# Patient Record
Sex: Female | Born: 1935 | Race: White | Hispanic: No | Smoking: Former smoker
Health system: Southern US, Community
[De-identification: ages and names within clinical notes are randomized; demographics above are authoritative.]

## PROBLEM LIST (undated history)

## (undated) DIAGNOSIS — G609 Hereditary and idiopathic neuropathy, unspecified: Secondary | ICD-10-CM

## (undated) DIAGNOSIS — J449 Chronic obstructive pulmonary disease, unspecified: Secondary | ICD-10-CM

## (undated) DIAGNOSIS — I4891 Unspecified atrial fibrillation: Secondary | ICD-10-CM

## (undated) DIAGNOSIS — F329 Major depressive disorder, single episode, unspecified: Secondary | ICD-10-CM

## (undated) DIAGNOSIS — E785 Hyperlipidemia, unspecified: Secondary | ICD-10-CM

## (undated) DIAGNOSIS — M353 Polymyalgia rheumatica: Secondary | ICD-10-CM

## (undated) DIAGNOSIS — R55 Syncope and collapse: Secondary | ICD-10-CM

## (undated) DIAGNOSIS — F32A Depression, unspecified: Secondary | ICD-10-CM

## (undated) DIAGNOSIS — I10 Essential (primary) hypertension: Secondary | ICD-10-CM

## (undated) DIAGNOSIS — I509 Heart failure, unspecified: Secondary | ICD-10-CM

## (undated) DIAGNOSIS — K219 Gastro-esophageal reflux disease without esophagitis: Secondary | ICD-10-CM

## (undated) DIAGNOSIS — S32599A Other specified fracture of unspecified pubis, initial encounter for closed fracture: Secondary | ICD-10-CM

## (undated) DIAGNOSIS — E039 Hypothyroidism, unspecified: Secondary | ICD-10-CM

## (undated) HISTORY — DX: Hereditary and idiopathic neuropathy, unspecified: G60.9

## (undated) HISTORY — DX: Other specified fracture of unspecified pubis, initial encounter for closed fracture: S32.599A

## (undated) HISTORY — DX: Hypothyroidism, unspecified: E03.9

## (undated) HISTORY — DX: Hyperlipidemia, unspecified: E78.5

## (undated) HISTORY — DX: Depression, unspecified: F32.A

## (undated) HISTORY — PX: PARTIAL HYSTERECTOMY: SHX80

## (undated) HISTORY — DX: Chronic obstructive pulmonary disease, unspecified: J44.9

## (undated) HISTORY — PX: PLEURAL SCARIFICATION: SHX748

## (undated) HISTORY — DX: Essential (primary) hypertension: I10

## (undated) HISTORY — DX: Polymyalgia rheumatica: M35.3

## (undated) HISTORY — DX: Unspecified atrial fibrillation: I48.91

## (undated) HISTORY — DX: Syncope and collapse: R55

## (undated) HISTORY — DX: Major depressive disorder, single episode, unspecified: F32.9

## (undated) HISTORY — DX: Gastro-esophageal reflux disease without esophagitis: K21.9

## (undated) HISTORY — DX: Heart failure, unspecified: I50.9

---
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
## 1997-12-30 ENCOUNTER — Ambulatory Visit (HOSPITAL_COMMUNITY): Admission: RE | Admit: 1997-12-30 | Discharge: 1997-12-30 | Payer: Self-pay | Admitting: Pulmonary Disease

## 1999-05-27 ENCOUNTER — Encounter: Payer: Self-pay | Admitting: Family Medicine

## 1999-05-27 LAB — CONVERTED CEMR LAB: Hgb A1c MFr Bld: 6.7 %

## 1999-09-14 ENCOUNTER — Other Ambulatory Visit: Admission: RE | Admit: 1999-09-14 | Discharge: 1999-09-14 | Payer: Self-pay | Admitting: Family Medicine

## 1999-10-25 ENCOUNTER — Encounter: Payer: Self-pay | Admitting: Family Medicine

## 1999-10-25 LAB — CONVERTED CEMR LAB
Hgb A1c MFr Bld: 6 %
Microalbumin U total vol: 300 mg/L

## 1999-11-22 ENCOUNTER — Encounter: Payer: Self-pay | Admitting: Family Medicine

## 1999-11-22 ENCOUNTER — Encounter: Admission: RE | Admit: 1999-11-22 | Discharge: 1999-11-22 | Payer: Self-pay | Admitting: Family Medicine

## 2000-02-24 ENCOUNTER — Encounter: Payer: Self-pay | Admitting: Family Medicine

## 2000-02-24 LAB — CONVERTED CEMR LAB
Hgb A1c MFr Bld: 6.2 %
Microalbumin U total vol: 73.5 mg/L

## 2000-06-26 ENCOUNTER — Encounter: Payer: Self-pay | Admitting: Family Medicine

## 2000-06-26 LAB — CONVERTED CEMR LAB: Hgb A1c MFr Bld: 5.6 %

## 2001-04-26 ENCOUNTER — Encounter: Payer: Self-pay | Admitting: Family Medicine

## 2001-04-26 LAB — CONVERTED CEMR LAB: Pap Smear: NORMAL

## 2001-05-07 ENCOUNTER — Other Ambulatory Visit: Admission: RE | Admit: 2001-05-07 | Discharge: 2001-05-07 | Payer: Self-pay | Admitting: Family Medicine

## 2001-05-26 ENCOUNTER — Encounter: Admission: RE | Admit: 2001-05-26 | Discharge: 2001-05-26 | Payer: Self-pay | Admitting: Family Medicine

## 2001-05-26 ENCOUNTER — Ambulatory Visit: Admission: RE | Admit: 2001-05-26 | Discharge: 2001-05-26 | Payer: Self-pay | Admitting: Pulmonary Disease

## 2001-05-26 ENCOUNTER — Encounter: Payer: Self-pay | Admitting: Family Medicine

## 2001-09-26 ENCOUNTER — Encounter: Payer: Self-pay | Admitting: Family Medicine

## 2001-09-26 LAB — CONVERTED CEMR LAB
Hgb A1c MFr Bld: 5.4 %
Microalbumin U total vol: 40.8 mg/L

## 2002-06-26 ENCOUNTER — Encounter: Payer: Self-pay | Admitting: Family Medicine

## 2002-06-26 LAB — CONVERTED CEMR LAB
Hgb A1c MFr Bld: 6.4 %
Microalbumin U total vol: 58 mg/L

## 2002-09-26 ENCOUNTER — Encounter: Payer: Self-pay | Admitting: Family Medicine

## 2002-09-26 LAB — CONVERTED CEMR LAB: Hgb A1c MFr Bld: 5.9 %

## 2003-05-05 ENCOUNTER — Encounter: Payer: Self-pay | Admitting: Emergency Medicine

## 2003-05-05 ENCOUNTER — Inpatient Hospital Stay (HOSPITAL_COMMUNITY): Admission: EM | Admit: 2003-05-05 | Discharge: 2003-05-06 | Payer: Self-pay | Admitting: Emergency Medicine

## 2003-05-06 ENCOUNTER — Encounter: Payer: Self-pay | Admitting: Cardiology

## 2003-06-27 ENCOUNTER — Encounter: Payer: Self-pay | Admitting: Family Medicine

## 2003-06-27 LAB — CONVERTED CEMR LAB
Hgb A1c MFr Bld: 5.5 %
Hgb A1c MFr Bld: 5.5 %
Microalbumin U total vol: 88.7 mg/L

## 2003-08-27 ENCOUNTER — Encounter: Payer: Self-pay | Admitting: Family Medicine

## 2003-08-27 LAB — CONVERTED CEMR LAB
Hgb A1c MFr Bld: 6.4 %
Hgb A1c MFr Bld: 6.4 %

## 2003-08-29 ENCOUNTER — Inpatient Hospital Stay (HOSPITAL_COMMUNITY): Admission: EM | Admit: 2003-08-29 | Discharge: 2003-09-02 | Payer: Self-pay | Admitting: Physical Therapy

## 2004-04-26 ENCOUNTER — Encounter: Payer: Self-pay | Admitting: Family Medicine

## 2004-04-26 LAB — CONVERTED CEMR LAB
Hgb A1c MFr Bld: 6.2 %
Hgb A1c MFr Bld: 6.2 %
Microalbumin U total vol: 7.6 mg/L
Microalbumin U total vol: 7.6 mg/L

## 2004-07-10 ENCOUNTER — Ambulatory Visit: Payer: Self-pay | Admitting: Family Medicine

## 2004-07-26 ENCOUNTER — Encounter: Payer: Self-pay | Admitting: Family Medicine

## 2004-07-26 LAB — CONVERTED CEMR LAB
Hgb A1c MFr Bld: 6.3 %
Hgb A1c MFr Bld: 6.3 %

## 2004-08-14 ENCOUNTER — Ambulatory Visit: Payer: Self-pay | Admitting: Family Medicine

## 2004-08-17 ENCOUNTER — Ambulatory Visit: Payer: Self-pay | Admitting: Family Medicine

## 2004-09-10 ENCOUNTER — Ambulatory Visit: Payer: Self-pay | Admitting: Family Medicine

## 2004-09-24 ENCOUNTER — Ambulatory Visit: Payer: Self-pay | Admitting: Family Medicine

## 2004-10-22 ENCOUNTER — Ambulatory Visit: Payer: Self-pay | Admitting: Family Medicine

## 2004-11-19 ENCOUNTER — Ambulatory Visit: Payer: Self-pay | Admitting: Family Medicine

## 2004-12-17 ENCOUNTER — Ambulatory Visit: Payer: Self-pay | Admitting: Family Medicine

## 2005-01-14 ENCOUNTER — Ambulatory Visit: Payer: Self-pay | Admitting: Family Medicine

## 2005-02-11 ENCOUNTER — Ambulatory Visit: Payer: Self-pay | Admitting: Family Medicine

## 2005-02-14 ENCOUNTER — Ambulatory Visit: Payer: Self-pay | Admitting: Family Medicine

## 2005-03-18 ENCOUNTER — Ambulatory Visit: Payer: Self-pay | Admitting: Family Medicine

## 2005-04-15 ENCOUNTER — Ambulatory Visit: Payer: Self-pay | Admitting: Family Medicine

## 2005-05-14 ENCOUNTER — Ambulatory Visit: Payer: Self-pay | Admitting: Family Medicine

## 2005-05-26 ENCOUNTER — Encounter: Payer: Self-pay | Admitting: Family Medicine

## 2005-05-26 LAB — CONVERTED CEMR LAB
Hgb A1c MFr Bld: 5.7 %
Hgb A1c MFr Bld: 5.7 %

## 2005-06-11 ENCOUNTER — Ambulatory Visit: Payer: Self-pay | Admitting: Family Medicine

## 2005-06-26 ENCOUNTER — Ambulatory Visit: Payer: Self-pay | Admitting: Family Medicine

## 2005-07-10 ENCOUNTER — Ambulatory Visit: Payer: Self-pay | Admitting: Family Medicine

## 2005-07-26 ENCOUNTER — Encounter: Payer: Self-pay | Admitting: Family Medicine

## 2005-07-26 LAB — CONVERTED CEMR LAB
Microalbumin U total vol: 7.9 mg/L
Microalbumin U total vol: 7.9 mg/L

## 2005-08-08 ENCOUNTER — Ambulatory Visit: Payer: Self-pay | Admitting: Family Medicine

## 2005-09-04 ENCOUNTER — Ambulatory Visit: Payer: Self-pay | Admitting: Family Medicine

## 2005-09-09 ENCOUNTER — Ambulatory Visit: Payer: Self-pay | Admitting: Family Medicine

## 2005-09-19 ENCOUNTER — Ambulatory Visit: Payer: Self-pay | Admitting: Cardiology

## 2005-10-01 ENCOUNTER — Encounter: Payer: Self-pay | Admitting: Internal Medicine

## 2005-10-01 ENCOUNTER — Ambulatory Visit: Payer: Self-pay

## 2005-10-02 ENCOUNTER — Ambulatory Visit: Payer: Self-pay | Admitting: Family Medicine

## 2005-10-14 ENCOUNTER — Ambulatory Visit: Payer: Self-pay | Admitting: Cardiology

## 2005-10-28 ENCOUNTER — Ambulatory Visit: Payer: Self-pay | Admitting: Family Medicine

## 2005-10-30 ENCOUNTER — Ambulatory Visit: Payer: Self-pay | Admitting: Family Medicine

## 2005-11-27 ENCOUNTER — Ambulatory Visit: Payer: Self-pay | Admitting: Family Medicine

## 2005-12-11 ENCOUNTER — Ambulatory Visit: Payer: Self-pay | Admitting: Family Medicine

## 2005-12-25 ENCOUNTER — Ambulatory Visit: Payer: Self-pay | Admitting: Family Medicine

## 2006-01-08 ENCOUNTER — Ambulatory Visit: Payer: Self-pay | Admitting: Family Medicine

## 2006-01-22 ENCOUNTER — Ambulatory Visit: Payer: Self-pay | Admitting: Family Medicine

## 2006-02-06 ENCOUNTER — Ambulatory Visit: Payer: Self-pay | Admitting: Family Medicine

## 2006-02-19 ENCOUNTER — Ambulatory Visit: Payer: Self-pay | Admitting: Family Medicine

## 2006-03-05 ENCOUNTER — Ambulatory Visit: Payer: Self-pay | Admitting: Family Medicine

## 2006-04-02 ENCOUNTER — Ambulatory Visit: Payer: Self-pay | Admitting: Family Medicine

## 2006-04-30 ENCOUNTER — Ambulatory Visit: Payer: Self-pay | Admitting: Family Medicine

## 2006-05-27 ENCOUNTER — Ambulatory Visit: Payer: Self-pay | Admitting: Family Medicine

## 2006-06-25 ENCOUNTER — Ambulatory Visit: Payer: Self-pay | Admitting: Family Medicine

## 2006-07-23 ENCOUNTER — Ambulatory Visit: Payer: Self-pay | Admitting: Family Medicine

## 2006-07-26 ENCOUNTER — Encounter: Payer: Self-pay | Admitting: Family Medicine

## 2006-07-26 LAB — CONVERTED CEMR LAB
Hgb A1c MFr Bld: 5.9 %
Microalbumin U total vol: 9.5 mg/L

## 2006-08-06 ENCOUNTER — Ambulatory Visit: Payer: Self-pay | Admitting: Family Medicine

## 2006-08-11 ENCOUNTER — Ambulatory Visit: Payer: Self-pay | Admitting: Family Medicine

## 2006-09-03 ENCOUNTER — Ambulatory Visit: Payer: Self-pay | Admitting: Family Medicine

## 2006-10-01 ENCOUNTER — Ambulatory Visit: Payer: Self-pay | Admitting: Family Medicine

## 2006-10-06 ENCOUNTER — Ambulatory Visit: Payer: Self-pay | Admitting: Family Medicine

## 2006-10-13 ENCOUNTER — Ambulatory Visit: Payer: Self-pay | Admitting: Family Medicine

## 2006-10-13 LAB — CONVERTED CEMR LAB
BUN: 23 mg/dL (ref 6–23)
Basophils Absolute: 0 10*3/uL (ref 0.0–0.1)
Basophils Relative: 0.2 % (ref 0.0–1.0)
CO2: 30 meq/L (ref 19–32)
Calcium: 9.1 mg/dL (ref 8.4–10.5)
Chloride: 103 meq/L (ref 96–112)
Creatinine, Ser: 1.2 mg/dL (ref 0.4–1.2)
Eosinophils Absolute: 0.2 10*3/uL (ref 0.0–0.6)
Eosinophils Relative: 2 % (ref 0.0–5.0)
GFR calc Af Amer: 56 mL/min
GFR calc non Af Amer: 46 mL/min
Glucose, Bld: 112 mg/dL — ABNORMAL HIGH (ref 70–99)
HCT: 42.4 % (ref 36.0–46.0)
Hemoglobin: 14.2 g/dL (ref 12.0–15.0)
Lymphocytes Relative: 26.2 % (ref 12.0–46.0)
MCHC: 33.5 g/dL (ref 30.0–36.0)
MCV: 89.1 fL (ref 78.0–100.0)
Monocytes Absolute: 0.8 10*3/uL — ABNORMAL HIGH (ref 0.2–0.7)
Monocytes Relative: 9.3 % (ref 3.0–11.0)
Neutro Abs: 5.2 10*3/uL (ref 1.4–7.7)
Neutrophils Relative %: 62.3 % (ref 43.0–77.0)
Platelets: 214 10*3/uL (ref 150–400)
Potassium: 4.2 meq/L (ref 3.5–5.1)
Pro B Natriuretic peptide (BNP): 168 pg/mL — ABNORMAL HIGH (ref 0.0–100.0)
RBC: 4.76 M/uL (ref 3.87–5.11)
RDW: 12.9 % (ref 11.5–14.6)
Sodium: 140 meq/L (ref 135–145)
TSH: 1.62 microintl units/mL (ref 0.35–5.50)
WBC: 8.4 10*3/uL (ref 4.5–10.5)

## 2006-10-27 ENCOUNTER — Ambulatory Visit: Payer: Self-pay | Admitting: Family Medicine

## 2006-11-03 ENCOUNTER — Ambulatory Visit: Payer: Self-pay | Admitting: Family Medicine

## 2006-11-12 ENCOUNTER — Ambulatory Visit: Payer: Self-pay | Admitting: Family Medicine

## 2006-11-12 LAB — CONVERTED CEMR LAB
BUN: 19 mg/dL (ref 6–23)
CO2: 31 meq/L (ref 19–32)
Calcium: 9.2 mg/dL (ref 8.4–10.5)
Chloride: 104 meq/L (ref 96–112)
Creatinine, Ser: 1.2 mg/dL (ref 0.4–1.2)
GFR calc Af Amer: 56 mL/min
GFR calc non Af Amer: 46 mL/min
Glucose, Bld: 73 mg/dL (ref 70–99)
Potassium: 4.2 meq/L (ref 3.5–5.1)
Sodium: 141 meq/L (ref 135–145)

## 2006-11-17 ENCOUNTER — Ambulatory Visit: Payer: Self-pay | Admitting: Family Medicine

## 2006-12-01 ENCOUNTER — Ambulatory Visit: Payer: Self-pay | Admitting: Family Medicine

## 2006-12-25 ENCOUNTER — Telehealth: Payer: Self-pay | Admitting: Family Medicine

## 2006-12-29 ENCOUNTER — Ambulatory Visit: Payer: Self-pay | Admitting: Family Medicine

## 2006-12-29 LAB — CONVERTED CEMR LAB
INR: 2
Prothrombin Time: 17.6 s

## 2007-01-28 ENCOUNTER — Ambulatory Visit: Payer: Self-pay | Admitting: Family Medicine

## 2007-01-28 LAB — CONVERTED CEMR LAB
INR: 2.4
Prothrombin Time: 18.7 s

## 2007-02-02 ENCOUNTER — Telehealth (INDEPENDENT_AMBULATORY_CARE_PROVIDER_SITE_OTHER): Payer: Self-pay | Admitting: *Deleted

## 2007-02-06 ENCOUNTER — Encounter: Payer: Self-pay | Admitting: Family Medicine

## 2007-02-06 DIAGNOSIS — E119 Type 2 diabetes mellitus without complications: Secondary | ICD-10-CM | POA: Insufficient documentation

## 2007-02-06 DIAGNOSIS — N950 Postmenopausal bleeding: Secondary | ICD-10-CM

## 2007-02-06 DIAGNOSIS — I4891 Unspecified atrial fibrillation: Secondary | ICD-10-CM

## 2007-02-06 DIAGNOSIS — I509 Heart failure, unspecified: Secondary | ICD-10-CM | POA: Insufficient documentation

## 2007-02-07 DIAGNOSIS — E785 Hyperlipidemia, unspecified: Secondary | ICD-10-CM

## 2007-02-07 DIAGNOSIS — E039 Hypothyroidism, unspecified: Secondary | ICD-10-CM | POA: Insufficient documentation

## 2007-02-16 ENCOUNTER — Telehealth (INDEPENDENT_AMBULATORY_CARE_PROVIDER_SITE_OTHER): Payer: Self-pay | Admitting: Internal Medicine

## 2007-02-25 ENCOUNTER — Ambulatory Visit: Payer: Self-pay | Admitting: Family Medicine

## 2007-02-25 DIAGNOSIS — J309 Allergic rhinitis, unspecified: Secondary | ICD-10-CM | POA: Insufficient documentation

## 2007-02-25 LAB — CONVERTED CEMR LAB
INR: 2.5
Prothrombin Time: 19.2 s

## 2007-03-12 ENCOUNTER — Ambulatory Visit: Payer: Self-pay | Admitting: Family Medicine

## 2007-03-19 ENCOUNTER — Encounter (INDEPENDENT_AMBULATORY_CARE_PROVIDER_SITE_OTHER): Payer: Self-pay | Admitting: *Deleted

## 2007-03-26 ENCOUNTER — Ambulatory Visit: Payer: Self-pay | Admitting: Family Medicine

## 2007-03-26 LAB — CONVERTED CEMR LAB
INR: 2.5
Prothrombin Time: 19.2 s

## 2007-03-27 ENCOUNTER — Encounter: Payer: Self-pay | Admitting: Family Medicine

## 2007-03-27 ENCOUNTER — Emergency Department (HOSPITAL_COMMUNITY): Admission: EM | Admit: 2007-03-27 | Discharge: 2007-03-27 | Payer: Self-pay | Admitting: Emergency Medicine

## 2007-03-30 ENCOUNTER — Telehealth: Payer: Self-pay | Admitting: Family Medicine

## 2007-04-23 ENCOUNTER — Ambulatory Visit: Payer: Self-pay | Admitting: Family Medicine

## 2007-04-23 LAB — CONVERTED CEMR LAB
INR: 2.5
Prothrombin Time: 19 s

## 2007-05-21 ENCOUNTER — Ambulatory Visit: Payer: Self-pay | Admitting: Family Medicine

## 2007-05-21 LAB — CONVERTED CEMR LAB
INR: 2.7
Prothrombin Time: 19.9 s

## 2007-05-28 ENCOUNTER — Ambulatory Visit: Payer: Self-pay | Admitting: Family Medicine

## 2007-06-24 ENCOUNTER — Ambulatory Visit: Payer: Self-pay | Admitting: Family Medicine

## 2007-06-24 LAB — CONVERTED CEMR LAB
INR: 2.9
Prothrombin Time: 20.6 s

## 2007-07-21 ENCOUNTER — Ambulatory Visit: Payer: Self-pay | Admitting: Family Medicine

## 2007-07-21 LAB — CONVERTED CEMR LAB
INR: 2
Prothrombin Time: 17.5 s

## 2007-08-06 ENCOUNTER — Ambulatory Visit: Payer: Self-pay | Admitting: Family Medicine

## 2007-08-06 LAB — CONVERTED CEMR LAB
ALT: 12 units/L (ref 0–35)
AST: 19 units/L (ref 0–37)
BUN: 30 mg/dL — ABNORMAL HIGH (ref 6–23)
Basophils Absolute: 0 10*3/uL (ref 0.0–0.1)
Basophils Relative: 0 % (ref 0.0–1.0)
CO2: 28 meq/L (ref 19–32)
Calcium: 9.4 mg/dL (ref 8.4–10.5)
Chloride: 104 meq/L (ref 96–112)
Cholesterol: 257 mg/dL (ref 0–200)
Creatinine, Ser: 1 mg/dL (ref 0.4–1.2)
Creatinine,U: 121.2 mg/dL
Direct LDL: 145.2 mg/dL
Eosinophils Absolute: 0.3 10*3/uL (ref 0.0–0.6)
Eosinophils Relative: 3.9 % (ref 0.0–5.0)
Free T4: 1 ng/dL (ref 0.6–1.6)
GFR calc Af Amer: 69 mL/min
GFR calc non Af Amer: 57 mL/min
Glucose, Bld: 77 mg/dL (ref 70–99)
HCT: 41.8 % (ref 36.0–46.0)
HDL: 33.5 mg/dL — ABNORMAL LOW (ref >39.0)
Hemoglobin: 14.2 g/dL (ref 12.0–15.0)
Hgb A1c MFr Bld: 6 % (ref 4.6–6.0)
Lymphocytes Relative: 38.2 % (ref 12.0–46.0)
MCHC: 34.1 g/dL (ref 30.0–36.0)
MCV: 90.9 fL (ref 78.0–100.0)
Microalb Creat Ratio: 14.9 mg/g (ref 0.0–30.0)
Microalb, Ur: 1.8 mg/dL (ref 0.0–1.9)
Monocytes Absolute: 0.6 10*3/uL (ref 0.2–0.7)
Monocytes Relative: 8.4 % (ref 3.0–11.0)
Neutro Abs: 3.4 10*3/uL (ref 1.4–7.7)
Neutrophils Relative %: 49.5 % (ref 43.0–77.0)
Platelets: 172 10*3/uL (ref 150–400)
Potassium: 4.9 meq/L (ref 3.5–5.1)
RBC: 4.59 M/uL (ref 3.87–5.11)
RDW: 12.5 % (ref 11.5–14.6)
Sodium: 140 meq/L (ref 135–145)
TSH: 4.39 microintl units/mL (ref 0.35–5.50)
Total CHOL/HDL Ratio: 7.7
Triglycerides: 268 mg/dL (ref 0–149)
VLDL: 54 mg/dL — ABNORMAL HIGH (ref 0–40)
WBC: 7 10*3/uL (ref 4.5–10.5)

## 2007-08-10 ENCOUNTER — Ambulatory Visit: Payer: Self-pay | Admitting: Family Medicine

## 2007-08-10 ENCOUNTER — Telehealth: Payer: Self-pay | Admitting: Family Medicine

## 2007-08-24 ENCOUNTER — Ambulatory Visit: Payer: Self-pay | Admitting: Family Medicine

## 2007-08-24 DIAGNOSIS — R0602 Shortness of breath: Secondary | ICD-10-CM

## 2007-09-01 ENCOUNTER — Ambulatory Visit: Payer: Self-pay | Admitting: Family Medicine

## 2007-09-01 LAB — CONVERTED CEMR LAB
INR: 3.6
Prothrombin Time: 22.8 s

## 2007-09-08 ENCOUNTER — Ambulatory Visit: Payer: Self-pay | Admitting: Cardiovascular Disease

## 2007-09-08 ENCOUNTER — Ambulatory Visit: Payer: Self-pay | Admitting: Pulmonary Disease

## 2007-09-14 ENCOUNTER — Telehealth: Payer: Self-pay | Admitting: Family Medicine

## 2007-09-21 ENCOUNTER — Ambulatory Visit: Payer: Self-pay | Admitting: Family Medicine

## 2007-09-21 DIAGNOSIS — D382 Neoplasm of uncertain behavior of pleura: Secondary | ICD-10-CM

## 2007-09-21 DIAGNOSIS — D383 Neoplasm of uncertain behavior of mediastinum: Secondary | ICD-10-CM

## 2007-09-21 DIAGNOSIS — D384 Neoplasm of uncertain behavior of thymus: Secondary | ICD-10-CM

## 2007-09-21 DIAGNOSIS — J984 Other disorders of lung: Secondary | ICD-10-CM

## 2007-09-21 LAB — CONVERTED CEMR LAB
INR: 1.7
Prothrombin Time: 15.9 s

## 2007-09-22 ENCOUNTER — Ambulatory Visit: Payer: Self-pay | Admitting: Family Medicine

## 2007-10-05 ENCOUNTER — Ambulatory Visit: Payer: Self-pay | Admitting: Family Medicine

## 2007-10-05 LAB — CONVERTED CEMR LAB
INR: 1.8
Prothrombin Time: 16.3 s

## 2007-11-02 ENCOUNTER — Ambulatory Visit: Payer: Self-pay | Admitting: Family Medicine

## 2007-11-02 LAB — CONVERTED CEMR LAB
INR: 2.3
Prothrombin Time: 18.6 s

## 2007-12-01 ENCOUNTER — Ambulatory Visit: Payer: Self-pay | Admitting: Family Medicine

## 2007-12-01 LAB — CONVERTED CEMR LAB
INR: 2
Prothrombin Time: 17.5 s

## 2007-12-22 ENCOUNTER — Ambulatory Visit: Payer: Self-pay | Admitting: Family Medicine

## 2007-12-29 ENCOUNTER — Ambulatory Visit: Payer: Self-pay | Admitting: Family Medicine

## 2007-12-29 LAB — CONVERTED CEMR LAB
INR: 2.3
Prothrombin Time: 18.4 s

## 2008-01-26 ENCOUNTER — Ambulatory Visit: Payer: Self-pay | Admitting: Family Medicine

## 2008-01-26 LAB — CONVERTED CEMR LAB
INR: 3.2
Prothrombin Time: 21.5 s

## 2008-02-03 ENCOUNTER — Ambulatory Visit: Payer: Self-pay | Admitting: Internal Medicine

## 2008-02-03 DIAGNOSIS — R5381 Other malaise: Secondary | ICD-10-CM | POA: Insufficient documentation

## 2008-02-03 DIAGNOSIS — R5383 Other fatigue: Secondary | ICD-10-CM

## 2008-02-05 ENCOUNTER — Ambulatory Visit: Payer: Self-pay | Admitting: Family Medicine

## 2008-02-10 ENCOUNTER — Ambulatory Visit: Payer: Self-pay | Admitting: Pulmonary Disease

## 2008-02-10 ENCOUNTER — Telehealth (INDEPENDENT_AMBULATORY_CARE_PROVIDER_SITE_OTHER): Payer: Self-pay | Admitting: *Deleted

## 2008-02-16 ENCOUNTER — Ambulatory Visit: Payer: Self-pay | Admitting: Family Medicine

## 2008-02-16 LAB — CONVERTED CEMR LAB
BUN: 27 mg/dL — ABNORMAL HIGH (ref 6–23)
Basophils Absolute: 0 10*3/uL (ref 0.0–0.1)
Basophils Relative: 0 % (ref 0.0–1.0)
CO2: 27 meq/L (ref 19–32)
Calcium: 9.4 mg/dL (ref 8.4–10.5)
Chloride: 105 meq/L (ref 96–112)
Creatinine, Ser: 1.3 mg/dL — ABNORMAL HIGH (ref 0.4–1.2)
Eosinophils Absolute: 0.2 10*3/uL (ref 0.0–0.7)
Eosinophils Relative: 2.9 % (ref 0.0–5.0)
GFR calc Af Amer: 51 mL/min
GFR calc non Af Amer: 42 mL/min
Glucose, Bld: 63 mg/dL — ABNORMAL LOW (ref 70–99)
HCT: 39.5 % (ref 36.0–46.0)
Hemoglobin: 13.7 g/dL (ref 12.0–15.0)
Lymphocytes Relative: 32.5 % (ref 12.0–46.0)
MCHC: 34.6 g/dL (ref 30.0–36.0)
MCV: 91 fL (ref 78.0–100.0)
Monocytes Absolute: 0.6 10*3/uL (ref 0.1–1.0)
Monocytes Relative: 8.6 % (ref 3.0–12.0)
Neutro Abs: 3.9 10*3/uL (ref 1.4–7.7)
Neutrophils Relative %: 56 % (ref 43.0–77.0)
Platelets: 184 10*3/uL (ref 150–400)
Potassium: 4.9 meq/L (ref 3.5–5.1)
RBC: 4.34 M/uL (ref 3.87–5.11)
RDW: 12.8 % (ref 11.5–14.6)
Sodium: 140 meq/L (ref 135–145)
TSH: 2.15 microintl units/mL (ref 0.35–5.50)
WBC: 7 10*3/uL (ref 4.5–10.5)

## 2008-02-19 ENCOUNTER — Ambulatory Visit: Payer: Self-pay | Admitting: Family Medicine

## 2008-02-19 DIAGNOSIS — N184 Chronic kidney disease, stage 4 (severe): Secondary | ICD-10-CM | POA: Insufficient documentation

## 2008-02-25 ENCOUNTER — Ambulatory Visit: Payer: Self-pay | Admitting: Pulmonary Disease

## 2008-02-25 LAB — CONVERTED CEMR LAB
BUN: 27 mg/dL — ABNORMAL HIGH (ref 6–23)
Creatinine, Ser: 1.3 mg/dL — ABNORMAL HIGH (ref 0.4–1.2)

## 2008-02-29 ENCOUNTER — Ambulatory Visit: Payer: Self-pay | Admitting: Cardiology

## 2008-03-08 ENCOUNTER — Ambulatory Visit: Payer: Self-pay | Admitting: Family Medicine

## 2008-03-10 ENCOUNTER — Ambulatory Visit: Payer: Self-pay | Admitting: Family Medicine

## 2008-03-10 LAB — CONVERTED CEMR LAB
BUN: 26 mg/dL — ABNORMAL HIGH (ref 6–23)
Creatinine, Ser: 1.3 mg/dL — ABNORMAL HIGH (ref 0.4–1.2)
GFR calc Af Amer: 51 mL/min
GFR calc non Af Amer: 42 mL/min
INR: 1.6
Prothrombin Time: 15.7 s

## 2008-03-17 ENCOUNTER — Ambulatory Visit: Payer: Self-pay | Admitting: Family Medicine

## 2008-03-24 ENCOUNTER — Ambulatory Visit: Payer: Self-pay | Admitting: Family Medicine

## 2008-03-24 LAB — CONVERTED CEMR LAB: INR: 3.3

## 2008-04-07 ENCOUNTER — Ambulatory Visit: Payer: Self-pay | Admitting: Family Medicine

## 2008-05-03 ENCOUNTER — Ambulatory Visit: Payer: Self-pay | Admitting: Family Medicine

## 2008-05-03 LAB — CONVERTED CEMR LAB
INR: 3.5
Prothrombin Time: 22.4 s

## 2008-05-22 ENCOUNTER — Other Ambulatory Visit: Payer: Self-pay

## 2008-05-22 ENCOUNTER — Inpatient Hospital Stay: Payer: Self-pay | Admitting: Internal Medicine

## 2008-05-22 ENCOUNTER — Ambulatory Visit: Payer: Self-pay | Admitting: Cardiology

## 2008-05-23 ENCOUNTER — Encounter: Payer: Self-pay | Admitting: Family Medicine

## 2008-05-31 ENCOUNTER — Ambulatory Visit: Payer: Self-pay | Admitting: Family Medicine

## 2008-05-31 DIAGNOSIS — R55 Syncope and collapse: Secondary | ICD-10-CM

## 2008-05-31 LAB — CONVERTED CEMR LAB: INR: 2.4

## 2008-06-09 ENCOUNTER — Telehealth: Payer: Self-pay | Admitting: Family Medicine

## 2008-06-20 ENCOUNTER — Ambulatory Visit: Payer: Self-pay | Admitting: Family Medicine

## 2008-06-20 LAB — CONVERTED CEMR LAB: Prothrombin Time: 21.9 s

## 2008-07-19 ENCOUNTER — Ambulatory Visit: Payer: Self-pay | Admitting: Family Medicine

## 2008-07-19 LAB — CONVERTED CEMR LAB
ALT: 11 units/L (ref 0–35)
AST: 18 units/L (ref 0–37)
Alkaline Phosphatase: 55 units/L (ref 39–117)
BUN: 26 mg/dL — ABNORMAL HIGH (ref 6–23)
Bilirubin, Direct: 0.1 mg/dL (ref 0.0–0.3)
Calcium: 9.3 mg/dL (ref 8.4–10.5)
Cholesterol: 217 mg/dL (ref 0–200)
Creatinine, Ser: 1.1 mg/dL (ref 0.4–1.2)
Direct LDL: 114.8 mg/dL
Free T4: 1.2 ng/dL (ref 0.6–1.6)
Glucose, Bld: 116 mg/dL — ABNORMAL HIGH (ref 70–99)
Hemoglobin: 13.1 g/dL (ref 12.0–15.0)
Lymphocytes Relative: 21.5 % (ref 12.0–46.0)
Monocytes Relative: 9 % (ref 3.0–12.0)
Neutro Abs: 5.5 10*3/uL (ref 1.4–7.7)
Neutrophils Relative %: 65.3 % (ref 43.0–77.0)
Phosphorus: 3 mg/dL (ref 2.3–4.6)
RDW: 13.1 % (ref 11.5–14.6)
Sodium: 139 meq/L (ref 135–145)
TSH: 2.22 microintl units/mL (ref 0.35–5.50)
Total Bilirubin: 0.7 mg/dL (ref 0.3–1.2)
Total CHOL/HDL Ratio: 7.1
VLDL: 44 mg/dL — ABNORMAL HIGH (ref 0–40)

## 2008-07-27 ENCOUNTER — Ambulatory Visit: Payer: Self-pay | Admitting: Family Medicine

## 2008-07-28 ENCOUNTER — Ambulatory Visit: Payer: Self-pay | Admitting: Internal Medicine

## 2008-08-10 ENCOUNTER — Encounter: Payer: Self-pay | Admitting: Family Medicine

## 2008-08-15 ENCOUNTER — Ambulatory Visit: Payer: Self-pay | Admitting: Pulmonary Disease

## 2008-08-15 DIAGNOSIS — J438 Other emphysema: Secondary | ICD-10-CM | POA: Insufficient documentation

## 2008-08-16 ENCOUNTER — Ambulatory Visit: Payer: Self-pay | Admitting: Family Medicine

## 2008-08-16 LAB — CONVERTED CEMR LAB
INR: 2.3
Prothrombin Time: 18.7 s

## 2008-08-23 ENCOUNTER — Encounter: Payer: Self-pay | Admitting: Internal Medicine

## 2008-08-23 ENCOUNTER — Ambulatory Visit: Payer: Self-pay

## 2008-08-31 ENCOUNTER — Ambulatory Visit: Payer: Self-pay | Admitting: Internal Medicine

## 2008-09-07 ENCOUNTER — Encounter: Payer: Self-pay | Admitting: Pulmonary Disease

## 2008-09-13 ENCOUNTER — Ambulatory Visit: Payer: Self-pay | Admitting: Family Medicine

## 2008-09-20 ENCOUNTER — Ambulatory Visit: Payer: Self-pay | Admitting: Family Medicine

## 2008-10-11 ENCOUNTER — Ambulatory Visit: Payer: Self-pay | Admitting: Family Medicine

## 2008-10-11 ENCOUNTER — Telehealth: Payer: Self-pay | Admitting: Family Medicine

## 2008-10-19 IMAGING — CT CT CHEST W/ CM
2 of 4 series · 15 of 36 positions shown, 18 images · IV contrast (Omnipaque 300)
Comparison: 03/27/2007

CLINICAL DATA: Wheezing and dyspnea since [REDACTED]. Followup right lower lobe
density.

CHEST CT WITH CONTRAST
TECHNIQUE: Multidetector CT imaging of the chest was performed following the
standard protocol during bolus administration of intravenous contrast.
Contrast:  80 cc Omnipaque 300

[Series 2: chest_routine 5.0 b40f st · axial · 0.69mm/px · z∈[-280,-60]mm · 12 of 52 slices shown, 15 images]
[im 4/52  mediastinal]
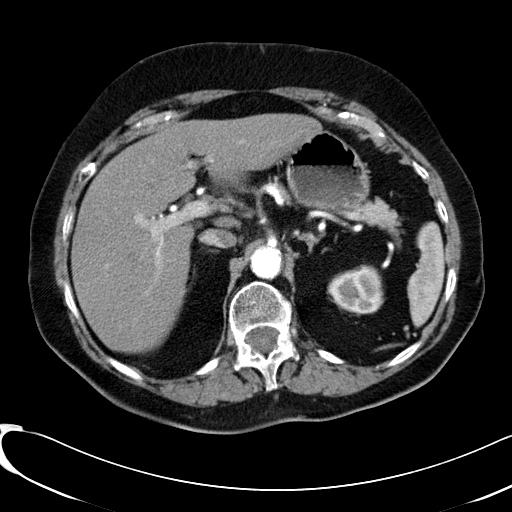
[im 4/52  lung]
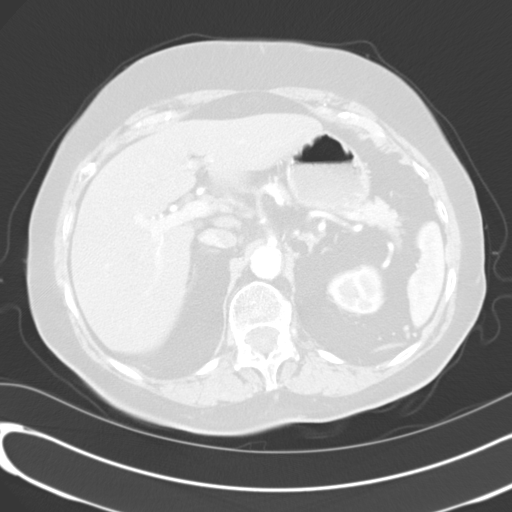
[im 8/52  lung]
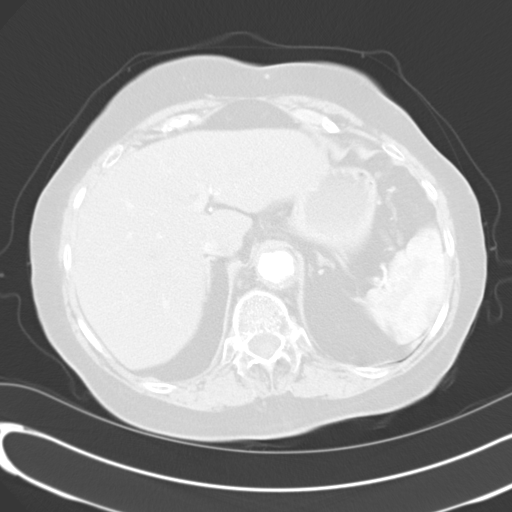
[im 12/52  lung]
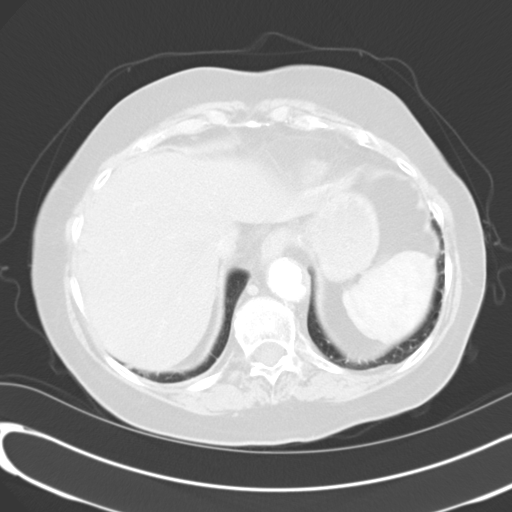
[im 16/52  lung]
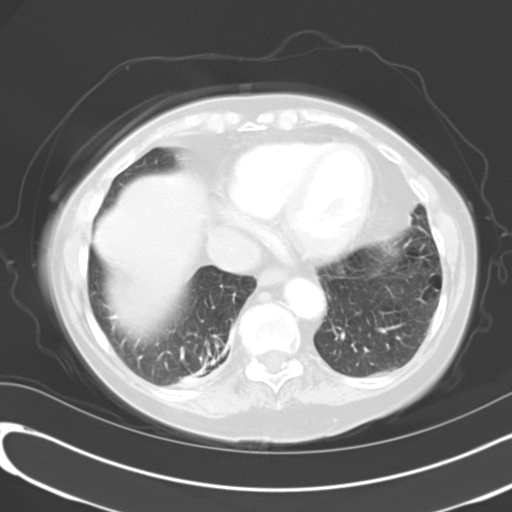
[im 20/52  mediastinal]
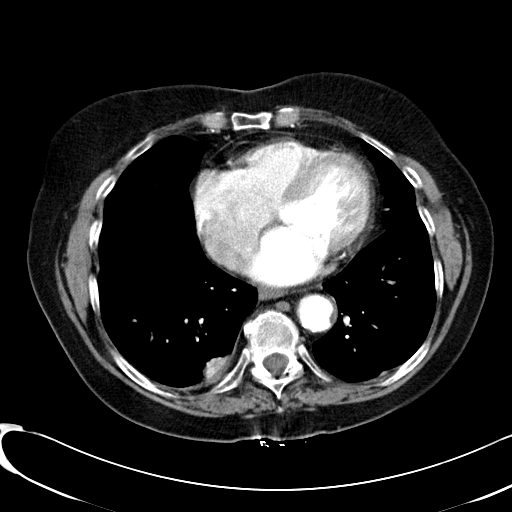
[im 20/52  lung]
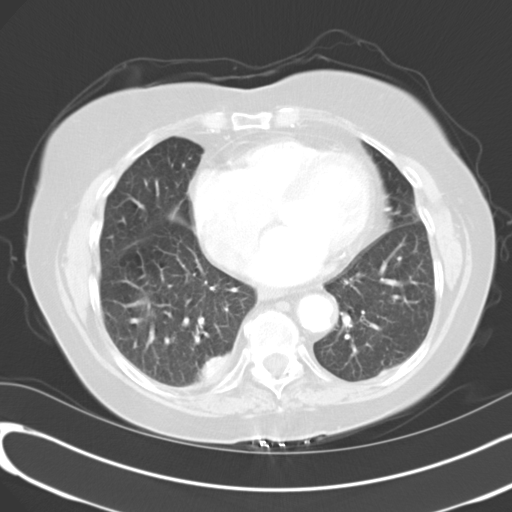
[im 24/52  lung]
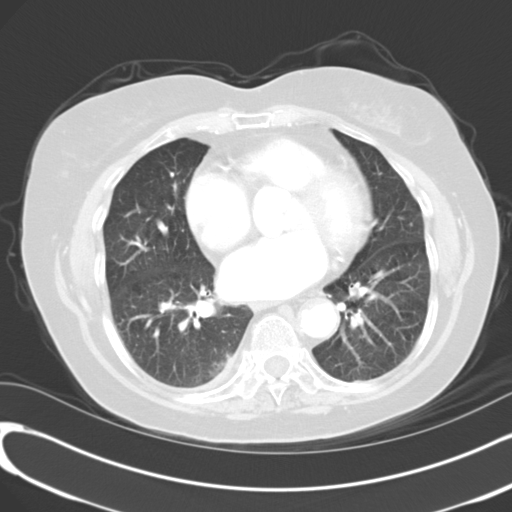
[im 28/52  lung]
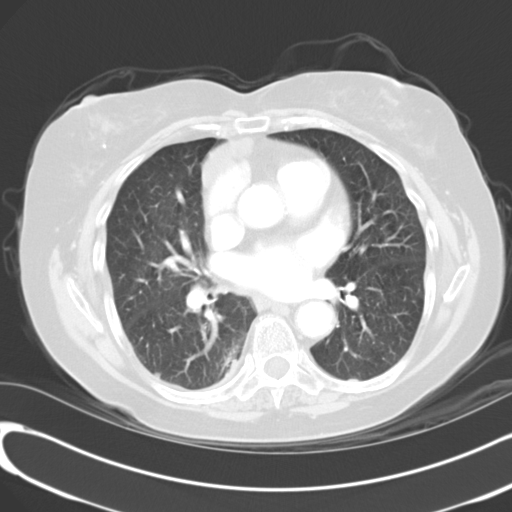
[im 32/52  lung]
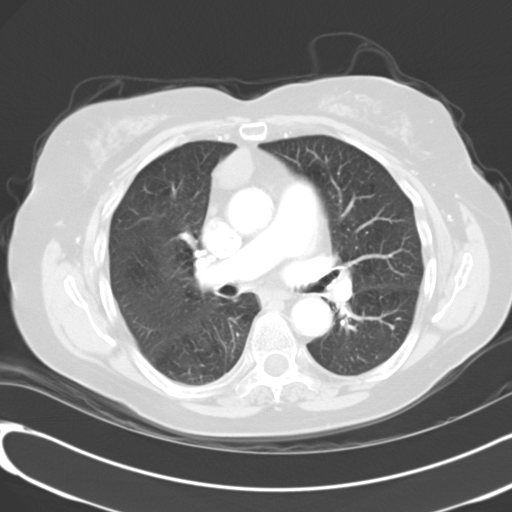
[im 36/52  mediastinal]
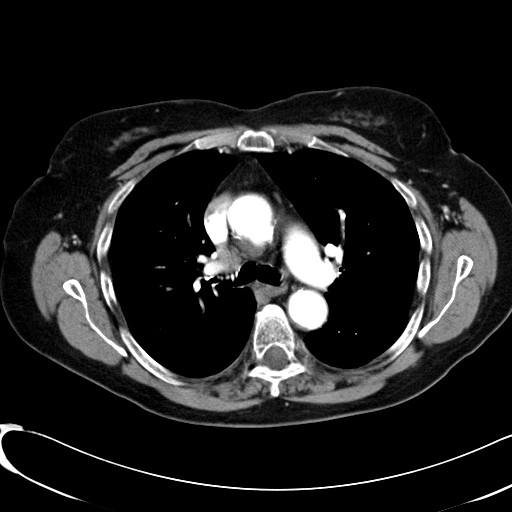
[im 36/52  lung]
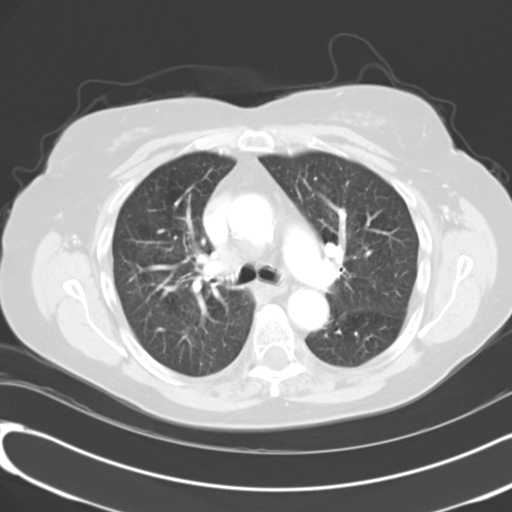
[im 40/52  lung]
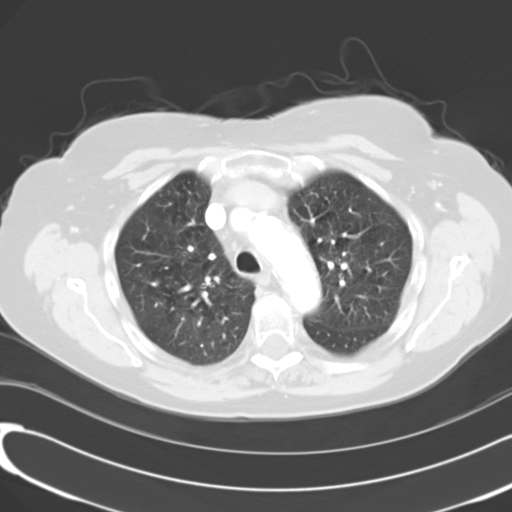
[im 44/52  lung]
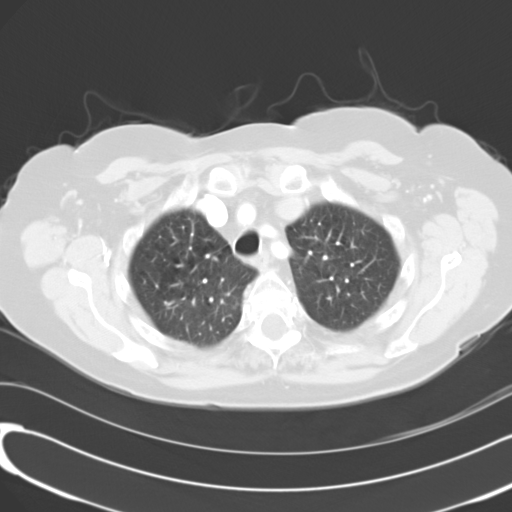
[im 48/52  lung]
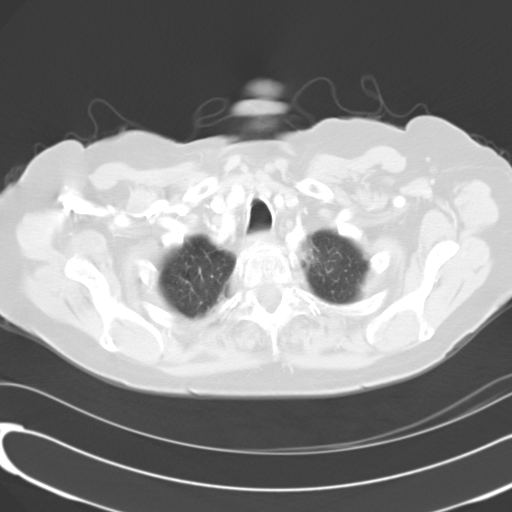

[Series 602: <mpr range> · coronal · 0.69mm/px · 3 of 112 slices shown]
[im 23/112  lung]
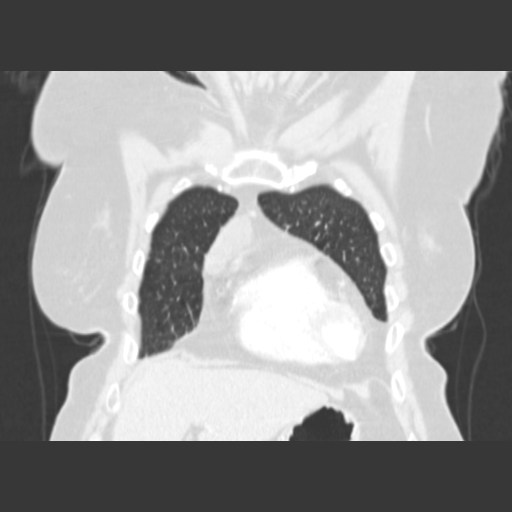
[im 45/112  lung]
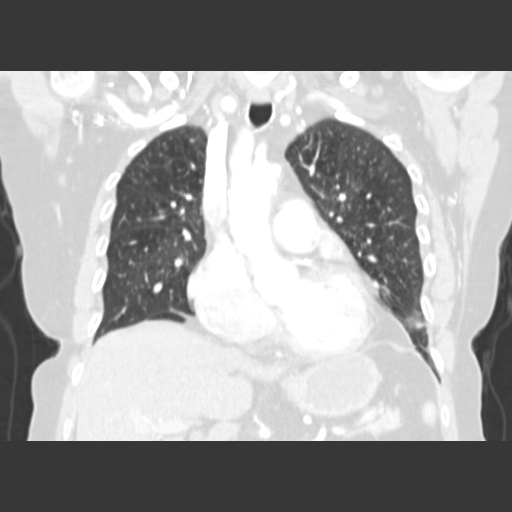
[im 67/112  lung]
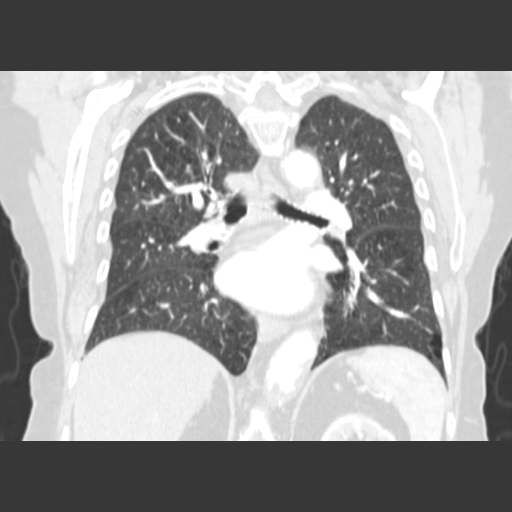

[15 of 36 positions shown; findings below may reference images not displayed]

FINDINGS: Lung windows demonstrate moderate centrilobular emphysema. Linear
scar atelectasis in the right lung base on image 36. Superiorly, this creates a
more nodular opacity which measures 2.8 x 1.0 cm on image 34, similar to on the
prior exam when remeasured.

1 mm left lower lobe lung nodule is stable on image 31.

Soft tissue windows demonstrate moderate aortic atherosclerosis. Mild
cardiomegaly without pericardial or pleural effusion. Minimal right pleural
thickening adjacent to the opacity in the right lower lobe described above. No
middle mediastinal or hilar adenopathy.

Low soft tissue density lesion in the anterior mediastinum  measures maximally
4.1 x 2.0 cm transverse (4.4 x 2.0 on prior. )Maximally 4.7 cm off coronal image
24. 4.8 cm on prior.

Limited abdominal imaging demonstrates prominent porta hepatis lymph nodes which
are stable and likely reactive. Osteopenia.

IMPRESSION

1. The right lower lobe pleural based opacity is similar and felt to most likely
represent an area of scar or rounded atelectasis. Ongoing CT surveillance
suggested to confirm stability (i.e. in 4-6 months)
2. Similar size of a soft tissue lesion in the anterior mediastinum.
Differential considerations remain lymphoma, thymoma, and benign entities such
as a complicated/hemorrhagic pericardial cyst.

## 2008-11-08 ENCOUNTER — Ambulatory Visit: Payer: Self-pay | Admitting: Family Medicine

## 2008-11-08 LAB — CONVERTED CEMR LAB: INR: 3.1

## 2008-12-07 ENCOUNTER — Ambulatory Visit: Payer: Self-pay | Admitting: Family Medicine

## 2008-12-07 LAB — CONVERTED CEMR LAB
INR: 2.3
Prothrombin Time: 18.5 s

## 2009-01-04 ENCOUNTER — Ambulatory Visit: Payer: Self-pay | Admitting: Family Medicine

## 2009-01-04 LAB — CONVERTED CEMR LAB: INR: 1.9

## 2009-02-02 ENCOUNTER — Ambulatory Visit: Payer: Self-pay | Admitting: Family Medicine

## 2009-02-02 LAB — CONVERTED CEMR LAB: Prothrombin Time: 19.7 s

## 2009-02-24 ENCOUNTER — Ambulatory Visit: Payer: Self-pay | Admitting: Internal Medicine

## 2009-02-24 ENCOUNTER — Encounter: Payer: Self-pay | Admitting: Internal Medicine

## 2009-02-24 DIAGNOSIS — I350 Nonrheumatic aortic (valve) stenosis: Secondary | ICD-10-CM

## 2009-03-02 ENCOUNTER — Ambulatory Visit: Payer: Self-pay | Admitting: Family Medicine

## 2009-03-30 ENCOUNTER — Ambulatory Visit: Payer: Self-pay | Admitting: Family Medicine

## 2009-03-30 LAB — CONVERTED CEMR LAB: Prothrombin Time: 19 s

## 2009-04-27 ENCOUNTER — Ambulatory Visit: Payer: Self-pay | Admitting: Family Medicine

## 2009-04-27 LAB — CONVERTED CEMR LAB
INR: 2.2
Prothrombin Time: 18.2 s

## 2009-05-25 ENCOUNTER — Ambulatory Visit: Payer: Self-pay | Admitting: Family Medicine

## 2009-05-25 LAB — CONVERTED CEMR LAB: Prothrombin Time: 20.2 s

## 2009-06-22 ENCOUNTER — Ambulatory Visit: Payer: Self-pay | Admitting: Family Medicine

## 2009-06-22 LAB — CONVERTED CEMR LAB
INR: 2.6
Prothrombin Time: 19.5 s

## 2009-06-29 ENCOUNTER — Ambulatory Visit: Payer: Self-pay | Admitting: Family Medicine

## 2009-07-04 IMAGING — US US CAROTID DUPLEX BILAT
1 series · 17 of 24 positions shown · non-contrast
Comparison: none

REASON FOR EXAM: Syncope
COMMENTS:

[Series 1: us carotid duplex bilat · 17 of 44 slices shown]
[im 1/44]
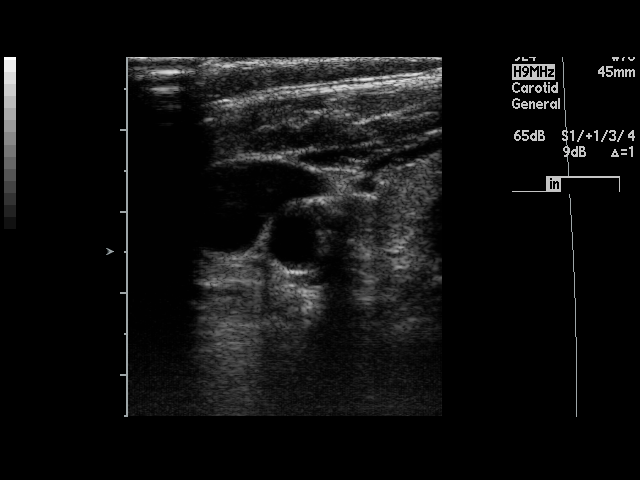
[im 4/44]
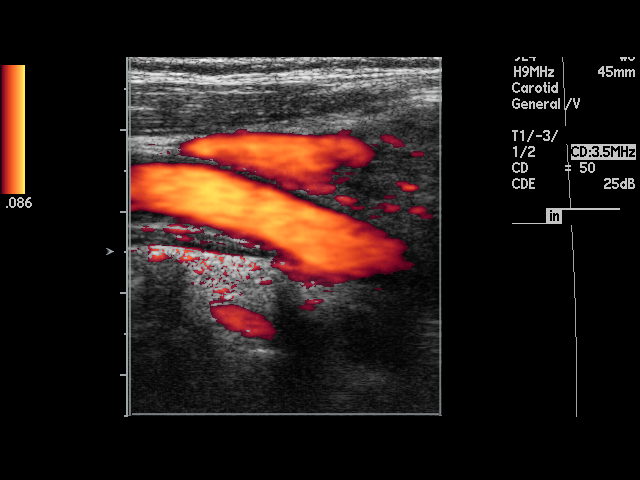
[im 6/44]
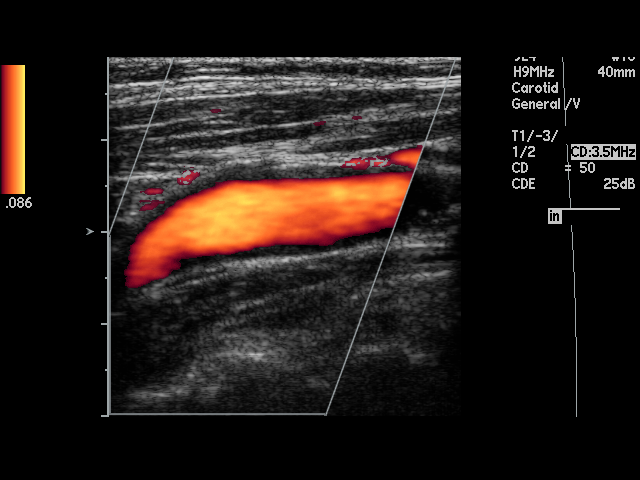
[im 8/44]
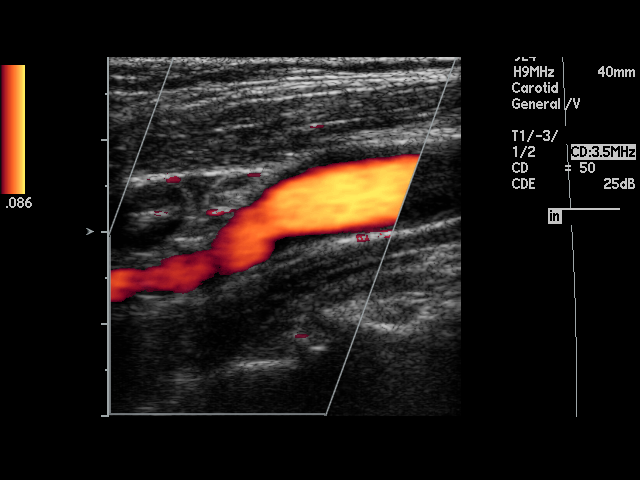
[im 12/44]
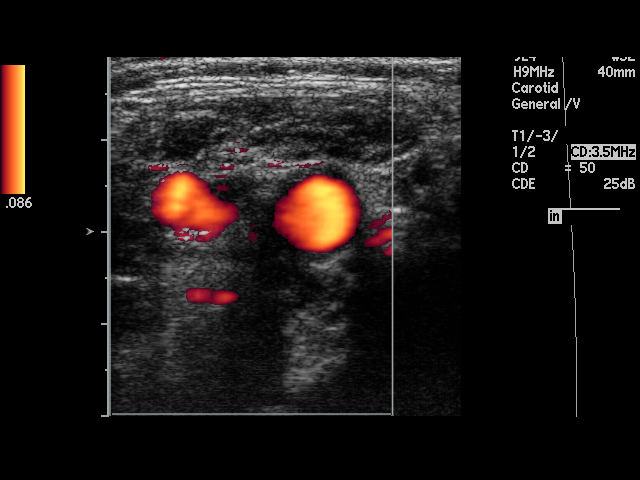
[im 14/44]
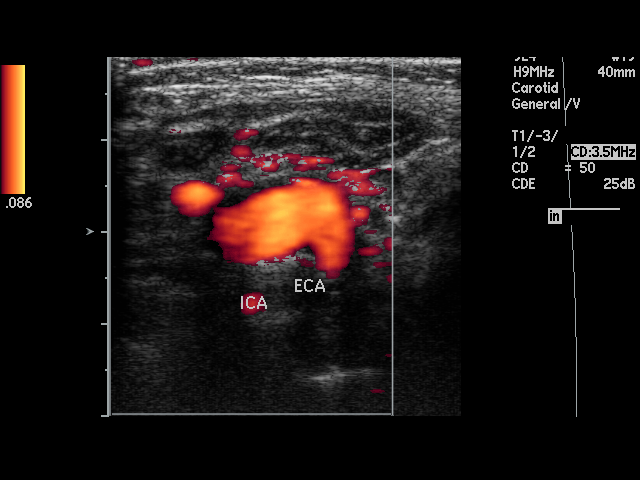
[im 17/44]
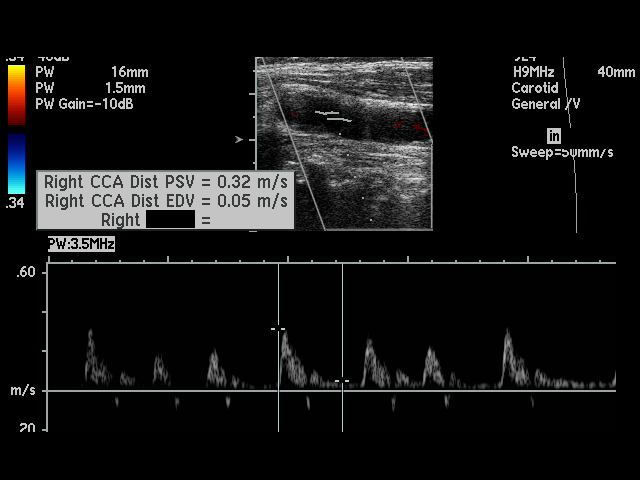
[im 19/44]
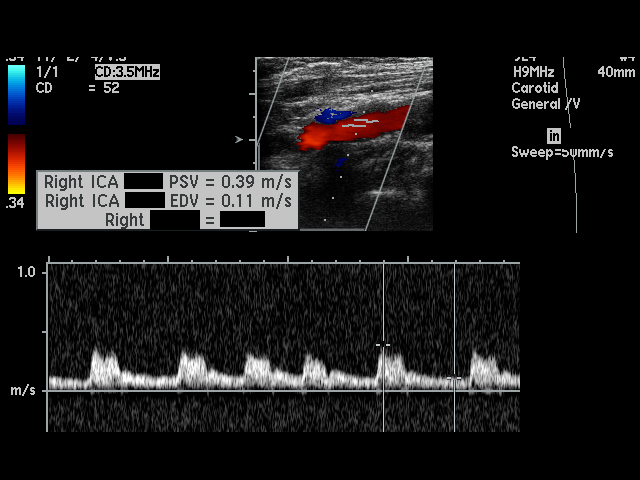
[im 23/44]
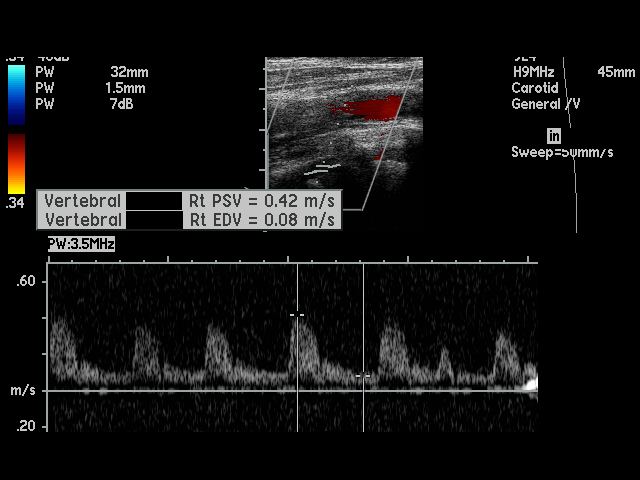
[im 25/44]
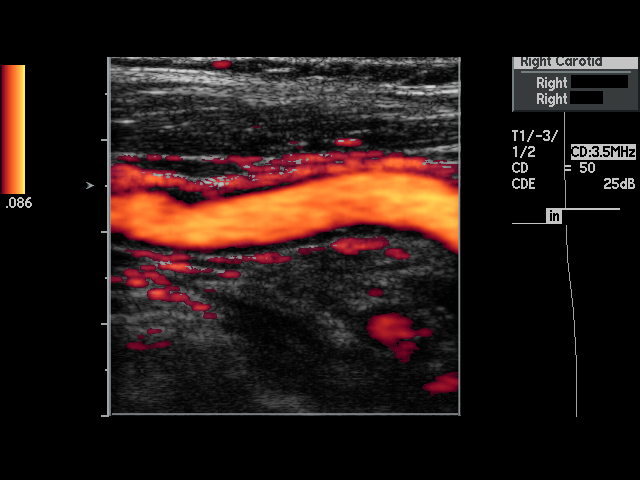
[im 27/44]
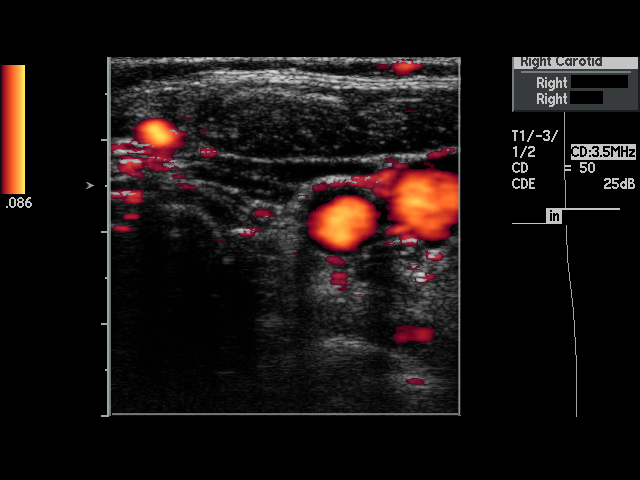
[im 30/44]
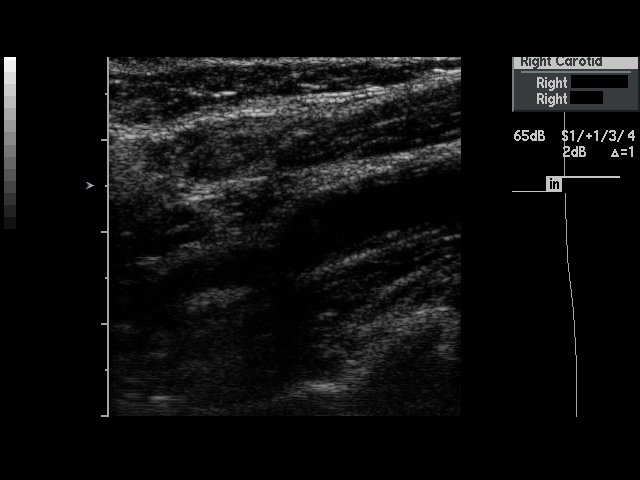
[im 32/44]
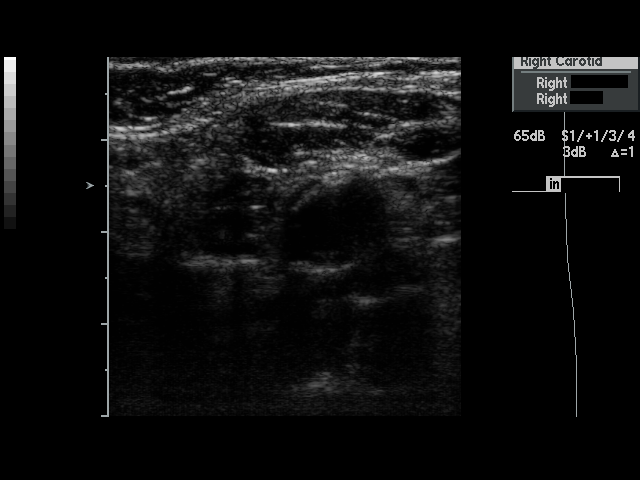
[im 36/44]
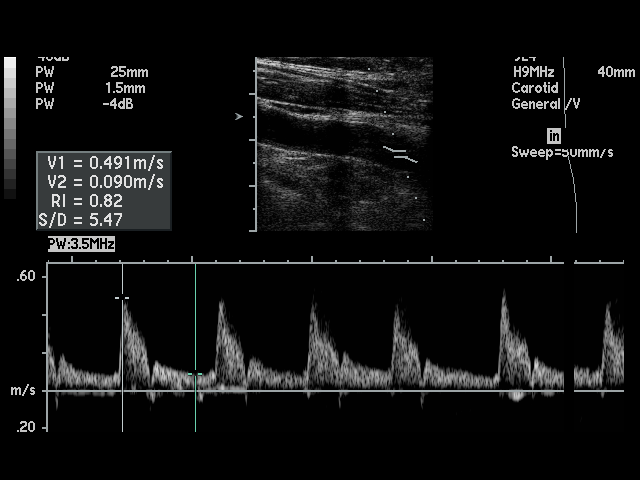
[im 38/44]
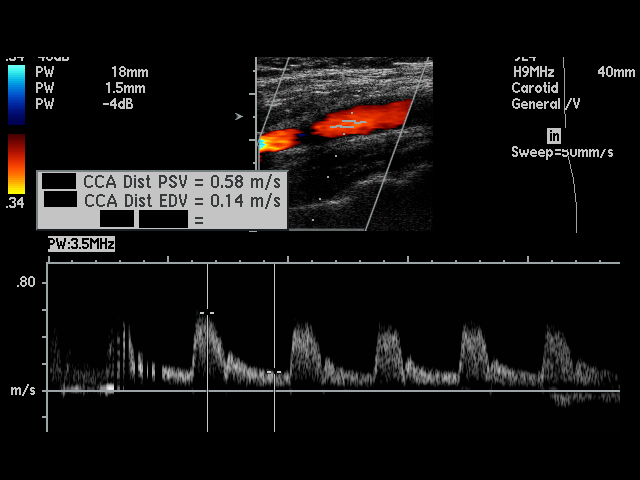
[im 40/44]
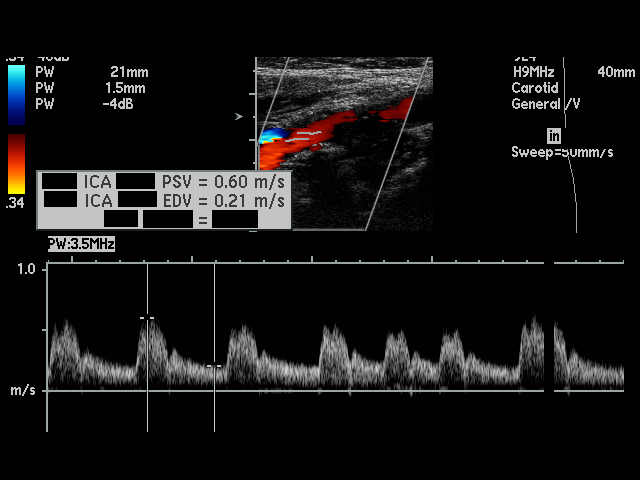
[im 44/44]
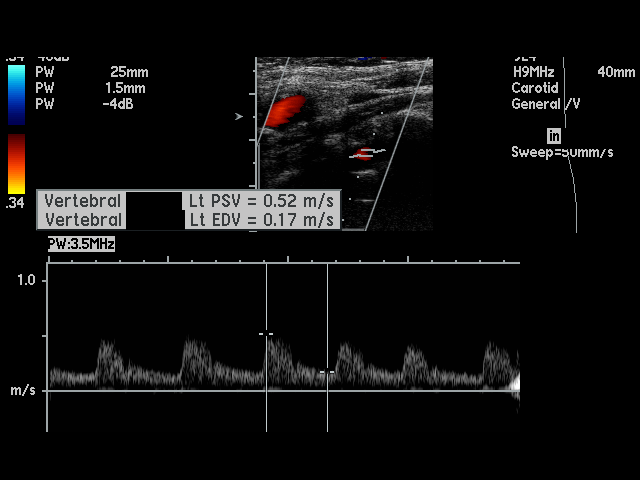

[17 of 24 positions shown; findings below may reference images not displayed]

PROCEDURE:     US  - US CAROTID DOPPLER BILATERAL  - May 23, 2008  [DATE]

RESULT:     Duplex Doppler interrogation of the carotid systems demonstrates
the presence of soft plaque with some calcification in the common carotid
and carotid bulb regions, extending into the proximal internal carotid
arteries bilaterally. Visually this does not appear to cause significant
stenosis. The color and spectral Doppler appearance is unremarkable. The
peak systolic velocity values are normal. The internal to common carotid
peak systolic velocity ratio on the RIGHT is 0.93. The ratio on the LEFT is
1.03. Antegrade flow is noted in both vertebral arteries.
IMPRESSION: No evidence of hemodynamically significant stenosis.

## 2009-07-07 ENCOUNTER — Ambulatory Visit: Payer: Self-pay | Admitting: Internal Medicine

## 2009-07-14 ENCOUNTER — Ambulatory Visit: Payer: Self-pay | Admitting: Pulmonary Disease

## 2009-07-27 ENCOUNTER — Ambulatory Visit: Payer: Self-pay | Admitting: Family Medicine

## 2009-08-14 ENCOUNTER — Ambulatory Visit: Payer: Self-pay | Admitting: Family Medicine

## 2009-08-14 LAB — CONVERTED CEMR LAB
ALT: 13 units/L (ref 0–35)
AST: 20 units/L (ref 0–37)
Basophils Absolute: 0.1 10*3/uL (ref 0.0–0.1)
Basophils Relative: 0.9 % (ref 0.0–3.0)
Bilirubin, Direct: 0.1 mg/dL (ref 0.0–0.3)
CO2: 28 meq/L (ref 19–32)
Calcium: 9.4 mg/dL (ref 8.4–10.5)
Chloride: 106 meq/L (ref 96–112)
Creatinine,U: 243.2 mg/dL
Eosinophils Relative: 5.2 % — ABNORMAL HIGH (ref 0.0–5.0)
Glucose, Bld: 101 mg/dL — ABNORMAL HIGH (ref 70–99)
HCT: 41.7 % (ref 36.0–46.0)
Hemoglobin: 13.7 g/dL (ref 12.0–15.0)
Hgb A1c MFr Bld: 5.9 % (ref 4.6–6.5)
MCHC: 33 g/dL (ref 30.0–36.0)
MCV: 91 fL (ref 78.0–100.0)
Microalb Creat Ratio: 39.1 mg/g — ABNORMAL HIGH (ref 0.0–30.0)
Neutrophils Relative %: 54 % (ref 43.0–77.0)
RBC: 4.58 M/uL (ref 3.87–5.11)
Sodium: 141 meq/L (ref 135–145)
Total CHOL/HDL Ratio: 8
Total Protein: 7.7 g/dL (ref 6.0–8.3)
Triglycerides: 223 mg/dL — ABNORMAL HIGH (ref 0.0–149.0)
WBC: 6.9 10*3/uL (ref 4.5–10.5)

## 2009-08-17 ENCOUNTER — Ambulatory Visit: Payer: Self-pay | Admitting: Family Medicine

## 2009-08-17 LAB — CONVERTED CEMR LAB
INR: 2.3
Prothrombin Time: 18.4 s

## 2009-08-23 ENCOUNTER — Ambulatory Visit: Payer: Self-pay | Admitting: Family Medicine

## 2009-08-31 ENCOUNTER — Ambulatory Visit: Payer: Self-pay | Admitting: Family Medicine

## 2009-08-31 LAB — CONVERTED CEMR LAB: INR: 2.3

## 2009-09-04 ENCOUNTER — Ambulatory Visit: Payer: Self-pay | Admitting: Family Medicine

## 2009-09-28 ENCOUNTER — Ambulatory Visit: Payer: Self-pay | Admitting: Family Medicine

## 2009-10-18 ENCOUNTER — Ambulatory Visit: Payer: Self-pay | Admitting: Family Medicine

## 2009-10-26 ENCOUNTER — Ambulatory Visit: Payer: Self-pay | Admitting: Family Medicine

## 2009-11-21 ENCOUNTER — Ambulatory Visit: Payer: Self-pay | Admitting: Family Medicine

## 2009-11-21 LAB — CONVERTED CEMR LAB
Bilirubin Urine: NEGATIVE
Protein, U semiquant: 100

## 2009-11-22 ENCOUNTER — Encounter: Payer: Self-pay | Admitting: Family Medicine

## 2009-11-27 ENCOUNTER — Telehealth: Payer: Self-pay | Admitting: Family Medicine

## 2009-12-19 ENCOUNTER — Ambulatory Visit: Payer: Self-pay | Admitting: Family Medicine

## 2009-12-19 LAB — CONVERTED CEMR LAB
INR: 2.5
Prothrombin Time: 29.9 s

## 2010-01-16 ENCOUNTER — Ambulatory Visit: Payer: Self-pay | Admitting: Family Medicine

## 2010-01-16 LAB — CONVERTED CEMR LAB: INR: 2.8

## 2010-01-31 ENCOUNTER — Telehealth: Payer: Self-pay | Admitting: Family Medicine

## 2010-02-05 ENCOUNTER — Encounter: Payer: Self-pay | Admitting: Family Medicine

## 2010-02-13 ENCOUNTER — Ambulatory Visit: Payer: Self-pay | Admitting: Family Medicine

## 2010-02-14 ENCOUNTER — Ambulatory Visit: Payer: Self-pay | Admitting: Family Medicine

## 2010-02-14 DIAGNOSIS — R42 Dizziness and giddiness: Secondary | ICD-10-CM | POA: Insufficient documentation

## 2010-02-21 ENCOUNTER — Ambulatory Visit: Payer: Self-pay | Admitting: Cardiovascular Disease

## 2010-02-21 DIAGNOSIS — I10 Essential (primary) hypertension: Secondary | ICD-10-CM | POA: Insufficient documentation

## 2010-03-13 ENCOUNTER — Ambulatory Visit: Payer: Self-pay | Admitting: Family Medicine

## 2010-04-03 ENCOUNTER — Encounter (INDEPENDENT_AMBULATORY_CARE_PROVIDER_SITE_OTHER): Payer: Self-pay | Admitting: *Deleted

## 2010-04-10 ENCOUNTER — Ambulatory Visit: Payer: Self-pay | Admitting: Family Medicine

## 2010-04-10 LAB — CONVERTED CEMR LAB: INR: 1.8

## 2010-04-17 ENCOUNTER — Telehealth: Payer: Self-pay | Admitting: Cardiovascular Disease

## 2010-04-23 ENCOUNTER — Ambulatory Visit: Payer: Self-pay | Admitting: Cardiovascular Disease

## 2010-05-07 LAB — CONVERTED CEMR LAB
AST: 17 units/L (ref 0–37)
Alkaline Phosphatase: 59 units/L (ref 39–117)
BUN: 30 mg/dL — ABNORMAL HIGH (ref 6–23)
Bilirubin, Direct: 0.1 mg/dL (ref 0.0–0.3)
CO2: 25 meq/L (ref 19–32)
Calcium: 9 mg/dL (ref 8.4–10.5)
Chloride: 105 meq/L (ref 96–112)
Creatinine, Ser: 1.5 mg/dL — ABNORMAL HIGH (ref 0.40–1.20)
Eosinophils Relative: 4 % (ref 0–5)
Glucose, Bld: 77 mg/dL (ref 70–99)
HCT: 41.1 % (ref 36.0–46.0)
Hemoglobin: 13.1 g/dL (ref 12.0–15.0)
LDL Cholesterol: 126 mg/dL — ABNORMAL HIGH (ref 0–99)
Lymphocytes Relative: 38 % (ref 12–46)
Lymphs Abs: 2.5 10*3/uL (ref 0.7–4.0)
Monocytes Absolute: 0.5 10*3/uL (ref 0.1–1.0)
Monocytes Relative: 7 % (ref 3–12)
RBC: 4.5 M/uL (ref 3.87–5.11)
RDW: 13.5 % (ref 11.5–15.5)
Total Bilirubin: 0.5 mg/dL (ref 0.3–1.2)
WBC: 6.7 10*3/uL (ref 4.0–10.5)

## 2010-05-08 ENCOUNTER — Ambulatory Visit: Payer: Self-pay | Admitting: Family Medicine

## 2010-05-08 LAB — CONVERTED CEMR LAB: Prothrombin Time: 23.5 s

## 2010-06-05 ENCOUNTER — Ambulatory Visit: Payer: Self-pay | Admitting: Family Medicine

## 2010-06-05 LAB — CONVERTED CEMR LAB
INR: 2.1
Prothrombin Time: 25.2 s

## 2010-07-11 ENCOUNTER — Ambulatory Visit: Payer: Self-pay | Admitting: Family Medicine

## 2010-07-11 LAB — CONVERTED CEMR LAB: Prothrombin Time: 24.5 s

## 2010-08-08 ENCOUNTER — Ambulatory Visit: Payer: Self-pay | Admitting: Family Medicine

## 2010-08-30 ENCOUNTER — Ambulatory Visit
Admission: RE | Admit: 2010-08-30 | Discharge: 2010-08-30 | Payer: Self-pay | Source: Home / Self Care | Attending: Family Medicine | Admitting: Family Medicine

## 2010-08-30 ENCOUNTER — Other Ambulatory Visit: Payer: Self-pay | Admitting: Family Medicine

## 2010-08-30 LAB — CBC WITH DIFFERENTIAL/PLATELET
Basophils Absolute: 0.1 10*3/uL (ref 0.0–0.1)
Basophils Relative: 0.8 % (ref 0.0–3.0)
Eosinophils Absolute: 0.2 10*3/uL (ref 0.0–0.7)
Eosinophils Relative: 2.9 % (ref 0.0–5.0)
HCT: 39.3 % (ref 36.0–46.0)
Hemoglobin: 13.1 g/dL (ref 12.0–15.0)
Lymphocytes Relative: 30 % (ref 12.0–46.0)
Lymphs Abs: 2 10*3/uL (ref 0.7–4.0)
MCHC: 33.4 g/dL (ref 30.0–36.0)
MCV: 89.9 fl (ref 78.0–100.0)
Monocytes Absolute: 0.6 10*3/uL (ref 0.1–1.0)
Monocytes Relative: 8.8 % (ref 3.0–12.0)
Neutro Abs: 3.8 10*3/uL (ref 1.4–7.7)
Neutrophils Relative %: 57.5 % (ref 43.0–77.0)
Platelets: 207 10*3/uL (ref 150.0–400.0)
RBC: 4.37 Mil/uL (ref 3.87–5.11)
RDW: 14.2 % (ref 11.5–14.6)
WBC: 6.6 10*3/uL (ref 4.5–10.5)

## 2010-08-30 LAB — MICROALBUMIN / CREATININE URINE RATIO
Creatinine,U: 159.2 mg/dL
Microalb Creat Ratio: 1.8 mg/g (ref 0.0–30.0)
Microalb, Ur: 2.8 mg/dL — ABNORMAL HIGH (ref 0.0–1.9)

## 2010-08-30 LAB — HEMOGLOBIN A1C: Hgb A1c MFr Bld: 6.1 % (ref 4.6–6.5)

## 2010-08-31 LAB — RENAL FUNCTION PANEL
Albumin: 3.8 g/dL (ref 3.5–5.2)
BUN: 25 mg/dL — ABNORMAL HIGH (ref 6–23)
CO2: 27 mEq/L (ref 19–32)
Calcium: 9.7 mg/dL (ref 8.4–10.5)
Chloride: 105 mEq/L (ref 96–112)
Creatinine, Ser: 1.5 mg/dL — ABNORMAL HIGH (ref 0.4–1.2)
GFR: 34.78 mL/min — ABNORMAL LOW (ref >60.00)
Glucose, Bld: 58 mg/dL — ABNORMAL LOW (ref 70–99)
Phosphorus: 4.2 mg/dL (ref 2.3–4.6)
Potassium: 4.6 mEq/L (ref 3.5–5.1)
Sodium: 141 mEq/L (ref 135–145)

## 2010-08-31 LAB — LIPID PANEL
Cholesterol: 232 mg/dL — ABNORMAL HIGH (ref 0–200)
HDL: 33.2 mg/dL — ABNORMAL LOW (ref >39.00)
Total CHOL/HDL Ratio: 7
Triglycerides: 225 mg/dL — ABNORMAL HIGH (ref 0.0–149.0)
VLDL: 45 mg/dL — ABNORMAL HIGH (ref 0.0–40.0)

## 2010-08-31 LAB — HEPATIC FUNCTION PANEL
ALT: 12 U/L (ref 0–35)
AST: 22 U/L (ref 0–37)
Albumin: 3.8 g/dL (ref 3.5–5.2)
Alkaline Phosphatase: 65 U/L (ref 39–117)
Bilirubin, Direct: 0.1 mg/dL (ref 0.0–0.3)
Total Bilirubin: 0.5 mg/dL (ref 0.3–1.2)
Total Protein: 7 g/dL (ref 6.0–8.3)

## 2010-08-31 LAB — LDL CHOLESTEROL, DIRECT: Direct LDL: 151.4 mg/dL

## 2010-08-31 LAB — T4, FREE: Free T4: 1.08 ng/dL (ref 0.60–1.60)

## 2010-08-31 LAB — TSH: TSH: 3.06 u[IU]/mL (ref 0.35–5.50)

## 2010-09-05 ENCOUNTER — Ambulatory Visit
Admission: RE | Admit: 2010-09-05 | Discharge: 2010-09-05 | Payer: Self-pay | Source: Home / Self Care | Attending: Family Medicine | Admitting: Family Medicine

## 2010-09-05 ENCOUNTER — Encounter: Payer: Self-pay | Admitting: Family Medicine

## 2010-09-05 LAB — CONVERTED CEMR LAB: Prothrombin Time: 17.6 s

## 2010-09-12 ENCOUNTER — Ambulatory Visit
Admission: RE | Admit: 2010-09-12 | Discharge: 2010-09-12 | Payer: Self-pay | Source: Home / Self Care | Attending: Family Medicine | Admitting: Family Medicine

## 2010-09-18 ENCOUNTER — Telehealth: Payer: Self-pay | Admitting: Family Medicine

## 2010-09-21 ENCOUNTER — Ambulatory Visit: Admit: 2010-09-21 | Payer: Self-pay | Admitting: Family Medicine

## 2010-09-23 LAB — CONVERTED CEMR LAB
BUN: 31 mg/dL — ABNORMAL HIGH (ref 6–23)
CO2: 28 meq/L (ref 19–32)
Calcium: 9.3 mg/dL (ref 8.4–10.5)
Chloride: 103 meq/L (ref 96–112)
Creatinine, Ser: 1.6 mg/dL — ABNORMAL HIGH (ref 0.4–1.2)
GFR calc Af Amer: 40 mL/min
GFR calc non Af Amer: 33 mL/min
Glucose, Bld: 109 mg/dL — ABNORMAL HIGH (ref 70–99)
Hgb A1c MFr Bld: 6.3 % — ABNORMAL HIGH (ref 4.6–6.0)
Potassium: 4.8 meq/L (ref 3.5–5.1)
Prothrombin Time: 30.1 s
Sodium: 139 meq/L (ref 135–145)
TSH: 5.03 microintl units/mL (ref 0.35–5.50)

## 2010-09-25 NOTE — Progress Notes (Signed)
Summary: form for diabetic supplies  Phone Note From Pharmacy   Caller: [facility] Summary of Call: Form for diabetic supplies is on your shelf.  Pt is agreeable. Initial call taken by: Lowella Petties CMA,  February 02, 2010 3:01 PM  Follow-up for Phone Call        Signed. Follow-up by: Shaune Leeks MD,  February 05, 2010 7:04 AM  Additional Follow-up for Phone Call Additional follow up Details #1::        Form faxed. Additional Follow-up by: Lowella Petties CMA,  February 05, 2010 8:15 AM

## 2010-09-25 NOTE — Assessment & Plan Note (Signed)
Summary: ear problems   Vital Signs:  Patient profile:   77 year old female Weight:      155 pounds BMI:     26.70 Temp:     97.7 degrees F oral Pulse rate:   80 / minute Pulse rhythm:   regular BP sitting:   140 / 80  (left arm) Cuff size:   regular  Vitals Entered By: Lowella Petties CMA (September 04, 2009 3:53 PM) CC: Left ear is hurting.   History of Present Illness: L ear is hurting and cannot hear out of it a all about 3-4 days  started when she washed out ear with wax solution  no fever  has allergies and nasal congestion all the time  throat is not sore   skin feels sore to touch below ear   clears throat in am but no cough  Allergies: 1)  ! Sulfasalazine (Sulfasalazine) 2)  ! * Narcotics 3)  ! * Claritin  Past History:  Past Medical History: Last updated: 02/24/2009 Atrial fibrillation-on chronic coumadin. Congestive heart failure, diastolic   --echo 12/09 EF 60% moderate diastolic dysfunction mild AS  Diabetes mellitus, type II Hyperlipidemia Hypothyroidism  Past Surgical History: Last updated: 05/26/2008 1945              Appendectomy                             Part Hyst Ovaries Intact                       Pneumothorax  Pleural Fluid B9                       Afib  Spont Resolved 1/98                Stress cardiolite  - nml 1/98                Echo nml except for mild A.S/AI 5/99                Thoracentesis - nml. except EOS due to pleural effusion 1/00                Echo, no change 1/00                Sleep study (-) ? GERD 08/03/02           DEXA - osteopenia 9/9 - 05/06/03   Chest pain - R/O'd A.Fib 05/06/03           Adenosine Cardiolite - nml. 05/06/03           Echo - EF 60% 1/3 - 09/02/03     RAD/Bronchitis 10/01/05             Echo  EF 55%  T.R. 9/27-9/28/09   HOSP [facility]  Syncope R/o'd ARI   Family History: Last updated: 08/15/2008 Father: D. 48 thrombosis of heart Mother: D. 70 O.D, pill popping in [place]. 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  worked at [employer] in [place], [place] Former  Smoker, 1/2 PPD x 40 years, quit 1988 Alcohol use-no  Risk Factors: Alcohol Use: 0 (08/23/2009) Caffeine Use: 1 (08/23/2009) Exercise: no (08/23/2009)  Risk Factors: Smoking Status: quit (08/23/2009) Passive Smoke Exposure: no (08/23/2009)  Review of Systems General:  Denies chills, fatigue, fever, loss of appetite, and malaise. Eyes:  Denies discharge and eye irritation. ENT:  Complains of nasal congestion and postnasal drainage; denies ear discharge, ringing in ears, sinus pressure, and sore throat. CV:  Denies chest pain or discomfort and palpitations. Resp:  Denies cough and wheezing. Derm:  Denies lesion(s) and rash. Neuro:  Denies headaches.  Physical Exam  General:  Well-developed,well-nourished,in no acute distress; alert,appropriate and cooperative throughout examination Head:  normocephalic, atraumatic, and no abnormalities observed.   Eyes:  vision grossly intact, pupils equal, pupils round, pupils reactive to light, and no injection.   Ears:  L canal with moderate cerumen - cleared by simple irrigation -- then noted canal mildly erythematous and no drainage/ nl TM R ear nl  Nose:  nares are boggy but clear  Mouth:  pharynx pink and moist, no erythema, and no exudates.   Neck:  No deformities, masses, or tenderness noted. Lungs:  Normal respiratory effort, chest expands symmetrically. Lungs are clear to auscultation, no crackles or wheezes. Heart:  Normal rate and regular rhythm. S1 and S2 normal without gallop, murmur, click, rub or other extra sounds. Skin:  Intact without suspicious  lesions or rashes Cervical Nodes:  No lymphadenopathy noted Psych:  normal affect, talkative and pleasant    Impression & Recommendations:  Problem # 1:  OTITIS EXTERNA (ICD-380.10) Assessment New acute with cerumen impaction- imp after simple irrigation tx with cipro otic hc - for infection and inflammation  (note pharmacy called and pt could not afford this-- changed px to cortisporin otic) pt advised to update me if symptoms worsen or do not improve - esp if ear pain or drainage The following medications were removed from the medication list:    Cipro Hc 0.2-1 % Susp (Ciprofloxacin-hydrocortisone) .Marland KitchenMarland KitchenMarland KitchenMarland Kitchen 3 drops in affected ear two times a day for 7 days Her updated medication list for this problem includes:    Cortisporin-tc 3.10-26-08-0.5 Mg/ml Susp (Neomycin-colist-hc-thonzonium) .Marland KitchenMarland KitchenMarland KitchenMarland Kitchen 3  drops in affected ear qid while awake  Complete Medication List: 1)  Levoxyl 88 Mcg Tabs (Levothyroxine sodium) .Marland Kitchen.. 1 by mouth daily 2)  Verapamil Hcl 80 Mg Tabs (Verapamil hcl) .... Take 1 tablet by mouth daily 3)  Glynase 3 Mg Tabs (Glyburide micronized) .Marland Kitchen.. 1 by mouth two times a day 4)  Coumadin 5 Mg Tabs (Warfarin sodium) .... Use as directed 5)  Diovan 80 Mg Tabs (Valsartan) .Marland Kitchen.. 1 daily by mouth 6)  Nystatin 100000 Unit/gm Crea (Nystatin) .... Apply to area two times a day for two weeks minimum. 7)  Clotrimazole-betamethasone 1-0.05 % Crea (Clotrimazole-betamethasone) .... Apply to rash twice a day sparingly 8)  Multivitamins Caps (Multiple vitamin) .... Take 1 by mouth once daily 9)  Proair Hfa 108 (90 Base) Mcg/act Aers (Albuterol sulfate) .... 2 puffs every 4-6 hours as needed 10)  Cortisporin-tc 3.10-26-08-0.5 Mg/ml Susp (Neomycin-colist-hc-thonzonium) .... 3  drops in affected ear qid while awake  Patient Instructions: 1)  use the cipro otic solution 3 drops in affected ear two times a day for 7 days 2)  if worse ear pain or any fever let me know  3)  if not improved in 7 days -  please let me know  Prescriptions: CIPRO HC 0.2-1 % SUSP (CIPROFLOXACIN-HYDROCORTISONE) 3  drops in affected ear two times a day for 7 days  #1 bottle x 0   Entered and Authorized by:   Judith Part MD   Signed by:   Judith Part MD on 09/04/2009   Method used:   Print then Give to Patient   RxID:   [id number]   Prior Medications (reviewed today): LEVOXYL 88 MCG TABS (LEVOTHYROXINE SODIUM) 1 by mouth daily VERAPAMIL HCL 80 MG  TABS (VERAPAMIL HCL) Take 1 tablet by mouth daily GLYNASE 3 MG TABS (GLYBURIDE MICRONIZED) 1 by mouth two times a day COUMADIN 5 MG TABS (WARFARIN SODIUM) use as directed DIOVAN 80 MG TABS (VALSARTAN) 1 daily by mouth NYSTATIN 100000 UNIT/GM CREA (NYSTATIN) apply to area two times a day for two weeks minimum. CLOTRIMAZOLE-BETAMETHASONE 1-0.05 % CREA (CLOTRIMAZOLE-BETAMETHASONE) apply to rash twice a day sparingly MULTIVITAMINS  CAPS (MULTIPLE VITAMIN) Take 1 by mouth once daily PROAIR HFA 108 (90 BASE) MCG/ACT  AERS (ALBUTEROL SULFATE) 2 puffs every 4-6 hours as needed Current Allergies: ! SULFASALAZINE (SULFASALAZINE) ! * NARCOTICS ! * CLARITIN

## 2010-09-25 NOTE — Progress Notes (Signed)
Summary: Verapamil  Phone Note Refill Request Message from:  [employer]line on November 27, 2009 4:58 PM  Refills Requested: Medication #1:  VERAPAMIL HCL 80 MG  TABS Take 1 tablet by mouth daily Refill request states Take Take 1 tablet by mouth two times a day.  Our meds list states Take 1 tablet by mouth once a day.  Which is correct?[facility]   Last Fill Date:  09/27/2009   Pharmacy Phone:  [phone]   Method Requested: Electronic Initial call taken by: Delilah Shan CMA (AAMA),  November 27, 2009 4:59 PM    Prescriptions: VERAPAMIL HCL 80 MG  TABS (VERAPAMIL HCL) Take 1 tablet by mouth daily  #30 x 11   Entered and Authorized by:   Shaune Leeks MD   Signed by:   Shaune Leeks MD on 11/27/2009   Method used:   Electronically to        [facility] [address] (retail)       [address], [address]       [place], [place]  [address]       Ph: [phone]       Fax: [phone]   RxID:   [id number]

## 2010-09-25 NOTE — Letter (Signed)
Summary: Nadara Eaton letter  [place] [facility]  [address] [place], [place] [address]   Phone: [phone]  Fax: [phone]       04/03/2010 MRN: [id number]  DEBBORAH ALONGE [address] [place], [place]  [address]  Dear Ms. [facility] [place] [facility] announce the retirement of Arta Silence, M.D., from full-time practice at the [facility] office effective February 22, 2010 and his plans of returning part-time.  It is important to Dr. Hetty Ely and to our practice that you understand that [facility] has seven physicians in our office for your health care needs.  We will continue to offer the same exceptional care that you have today.    Dr. Hetty Ely has spoken to many of you about his plans for retirement and returning part-time in the fall.   We will continue to work with you through the transition to schedule appointments for you in the office and meet the high standards that [facility] is committed to.   Again, it is with great pleasure that we share the news that Dr. Hetty Ely will return to [facility] in October of 2011 with a reduced schedule.    If you have any questions, or would like to request an appointment with one of our physicians, please call us at [phone] and press the option for Scheduling an appointment.  We take pleasure in providing you with excellent patient care and look forward to seeing you at your next office visit.  Our [facility] Physicians are:  Tillman Abide, M.D. Laurita Quint, M.D. Roxy Manns, M.D. Kerby Nora, M.D. Hannah Beat, M.D. Ruthe Mannan, M.D. We proudly welcomed Raechel Ache, M.D. and Eustaquio Boyden, M.D. to the practice in July/August 2011.  Sincerely,  [facility]

## 2010-09-25 NOTE — Assessment & Plan Note (Signed)
Summary: flu shot/ dunca/ alc   Nurse Visit   Allergies: 1)  ! Sulfasalazine (Sulfasalazine) 2)  ! * Narcotics 3)  ! * Claritin  Orders Added: 1)  Flu Vaccine 46yrs + MEDICARE PATIENTS [Q2039] 2)  Administration Flu vaccine - MCR [G0008]  Flu Vaccine Consent Questions     Do you have a history of severe allergic reactions to this vaccine? no    Any prior history of allergic reactions to egg and/or gelatin? no    Do you have a sensitivity to the preservative Thimersol? no    Do you have a past history of Guillan-Barre Syndrome? no    Do you currently have an acute febrile illness? no    Have you ever had a severe reaction to latex? no    Vaccine information given and explained to patient? yes    Are you currently pregnant? no    Lot Number:[id number]   Exp Date:02/23/2011   Site Given  Left Deltoid IM

## 2010-09-25 NOTE — Assessment & Plan Note (Signed)
Summary: ROA 1 MTHS CYD   Vital Signs:  Patient profile:   77 year old female Weight:      154 pounds Temp:     98.1 degrees F oral Pulse rate:   96 / minute Pulse rhythm:   irregular BP sitting:   122 / 68  (left arm) Cuff size:   regular  Vitals Entered By: Sydell Axon LPN (November 21, 2009 9:58 AM) CC: One month follow-up, ? UTI, pressure, urgency, burning and some pain   History of Present Illness: Pt here for recheck. She has gotten over the dizziness and feels the Singulair has helped...it is resolved.  She then had "bad cold" with congestion which hads resolved with Mucinex. She is having burning, pressure and frequency. She has taken nothing for it except Tyl for the pain. She has itchy eyes and sneezing but is not interested.   Allergies: 1)  ! Sulfasalazine (Sulfasalazine) 2)  ! * Narcotics 3)  ! * Claritin  Physical Exam  General:  Well-developed,well-nourished,in no acute distress; alert,appropriate and cooperative throughout examination Head:  normocephalic, atraumatic, and no abnormalities observed.  Sinuses NT. Eyes:  vision grossly intact, pupils equal, pupils round, pupils reactive to light, and no injection.   Ears:  External ear exam shows no significant lesions or deformities.  Otoscopic examination reveals clear canals, tympanic membranes are intact bilaterally without bulging, retraction, inflammation or discharge. Hearing is grossly normal bilaterally. Nose:  External nasal examination shows no deformity or inflammation. Nasal mucosa are pink and moist without lesions or exudates. Mouth:  pharynx pink and moist, no erythema, and no exudates.   Neck:  No deformities, masses, or tenderness noted. Chest Wall:  No deformities, masses, or tenderness noted. Lungs:  Normal respiratory effort, chest expands symmetrically. Lungs are clear to auscultation, no crackles or wheezes. Heart:  Normal rate and regular rhythm. S1 and S2 normal without gallop, murmur, click,  rub or other extra sounds. Abdomen:  Bowel sounds positive,abdomen soft and non-tender without masses, organomegaly or hernias noted. Mild suprapubic tenderness. Msk:  No CVAT.   Impression & Recommendations:  Problem # 1:  HEMORRHAGIC CYSTITIS (ICD-595.0) Assessment New  Will culture. Her updated medication list for this problem includes:    Macrobid 100 Mg Caps (Nitrofurantoin monohyd macro) ..... One tab by mouth two times a day    Pyridium 200 Mg Tabs (Phenazopyridine hcl) ..... One tab by mouth three times a day as needed burning with urination.  Encouraged to push clear liquids, get enough rest, and take acetaminophen as needed. To be seen in 10 days if no improvement, sooner if worse.  Problem # 2:  VERTIGO (ICD-780.4) Assessment: Improved Resolved. Her updated medication list for this problem includes:    Meclizine Hcl 25 Mg Tabs (Meclizine hcl) ..... One tab by mouth every 6hrs for dizziness  Problem # 3:  ALLERGIC RHINITIS (ICD-477.9) Assessment: Improved Controlled at present. Cont meds as prescribed. Discussed use of allergy medications and environmental measures.   Problem # 4:  SHORTNESS OF BREATH (ICD-786.05) Assessment: Unchanged Declines Advair at present due to cost.  Problem # 5:  VASOVAGAL SYNCOPE (ICD-780.2) Assessment: Improved BP ok today. Appears elevated BP resolved.  Complete Medication List: 1)  Levoxyl 88 Mcg Tabs (Levothyroxine sodium) .Marland Kitchen.. 1 by mouth daily 2)  Verapamil Hcl 80 Mg Tabs (Verapamil hcl) .... Take 1 tablet by mouth daily 3)  Glynase 3 Mg Tabs (Glyburide micronized) .Marland Kitchen.. 1 by mouth two times a day 4)  Coumadin 5  Mg Tabs (Warfarin sodium) .... Use as directed 5)  Diovan 80 Mg Tabs (Valsartan) .Marland Kitchen.. 1 daily by mouth 6)  Nystatin 100000 Unit/gm Crea (Nystatin) .... Apply to area two times a day for two weeks minimum. 7)  Clotrimazole-betamethasone 1-0.05 % Crea (Clotrimazole-betamethasone) .... Apply to rash twice a day sparingly 8)   Multivitamins Caps (Multiple vitamin) .... Take 1 by mouth once daily 9)  Proair Hfa 108 (90 Base) Mcg/act Aers (Albuterol sulfate) .... 2 puffs every 4-6 hours as needed 10)  Cortisporin-tc 3.10-26-08-0.5 Mg/ml Susp (Neomycin-colist-hc-thonzonium) .... 3  drops in affected ear four times a day while awake 11)  Meclizine Hcl 25 Mg Tabs (Meclizine hcl) .... One tab by mouth every 6hrs for dizziness 12)  Singulair 10 Mg Tabs (Montelukast sodium) .... One tab by mouth once daily 13)  Macrobid 100 Mg Caps (Nitrofurantoin monohyd macro) .... One tab by mouth two times a day 14)  Pyridium 200 Mg Tabs (Phenazopyridine hcl) .... One tab by mouth three times a day as needed burning with urination.  Other Orders: UA Dipstick W/ Micro (manual) (53664)  Patient Instructions: 1)  Take Macrobid and Pyridium for Cystitis. 2)  For congestion, use  Guaifenesin by going to [facility] or [facility] and getting MUCOUS RELIEF EXPECTORANT (400mg ), take 11/2 tabs by mouth AM and NOON. 3)  Drink lots of fluids anytime taking Guaifenesin.  Prescriptions: PYRIDIUM 200 MG TABS (PHENAZOPYRIDINE HCL) one tab by mouth three times a day as needed burning with urination.  #15 x 0   Entered and Authorized by:   Shaune Leeks MD   Signed by:   Shaune Leeks MD on 11/21/2009   Method used:   Electronically to        [facility] [address] (retail)       [address], [address]       [place], [place]  [address]       Ph: [phone]       Fax: [phone]   RxID:   [id number] MACROBID 100 MG CAPS (NITROFURANTOIN MONOHYD MACRO) one tab by mouth two times a day  #20 x 0   Entered and Authorized by:   Shaune Leeks MD   Signed by:   Shaune Leeks MD on 11/21/2009   Method used:   Electronically to        [facility] [address] (retail)       [address], [address]       [place], [place]  [address]       Ph:  [phone]       Fax: [phone]   RxID:   [id number] SINGULAIR 10 MG TABS (MONTELUKAST SODIUM) one tab by mouth once daily  #30 x 12   Entered and Authorized by:   Shaune Leeks MD   Signed by:   Shaune Leeks MD on 11/21/2009   Method used:   Electronically to        [facility] [address] (retail)       [address], [address]       [place], [place]  [address]       Ph: [phone]       Fax: [phone]   RxID:   [id number]   Current Allergies (reviewed today): ! SULFASALAZINE (SULFASALAZINE) ! *  NARCOTICS ! [facility]  Laboratory Results   Urine Tests  Date/Time Received: November 21, 2009 10:07 AM  Date/Time Reported: November 21, 2009 10:07 AM   Routine Urinalysis   Color: yellow Appearance: Cloudy Glucose: negative   (Normal Range: Negative) Bilirubin: negative   (Normal Range: Negative) Ketone: negative   (Normal Range: Negative) Spec. Gravity: >=1.030   (Normal Range: 1.003-1.035) Blood: large   (Normal Range: Negative) pH: 5.0   (Normal Range: 5.0-8.0) Protein: 100   (Normal Range: Negative) Urobilinogen: 0.2   (Normal Range: 0-1) Nitrite: negative   (Normal Range: Negative) Leukocyte Esterace: small   (Normal Range: Negative)        Appended Document: ROA 1 MTHS CYD U/A Micro TNTC WBCs and RBCs Rare Epis, 2+ bact  Appended Document: ROA 1 MTHS CYD

## 2010-09-25 NOTE — Assessment & Plan Note (Signed)
Summary: EC/GLC  Medications Added LOSARTAN POTASSIUM 50 MG TABS (LOSARTAN POTASSIUM) Take 1 tablet by mouth once a day      Allergies Added:   Visit Type:  Initial Consult Referring Provider:  Arvilla Meres Primary Provider:  Shaune Leeks MD  CC:  Still has weakness but much better since the decrease in verpamil.Marland Kitchen  History of Present Illness: Christine Stone is an 77 y/o woman with severe COPD, diabetes, chronic atrial fibrillation and HTN who returns for symptoms of profound fatigue, epsiode of nausea/vomiting.   She reports that several weeks ago she developed some upset stomach and vomited. Since that time, she has felt weak and has had little energy. she normally does much outside the house, takes care of her husband, uses her bike. For several days she cannot do very much at all. Now she is starting to feel a little bit better. She did see Dr. Hetty Ely who decreased her verapamil from 80 mg to 40 mg for heart rate of 56 on EKG. With this decreased dose, she feels a little bit better.        Current Medications (verified): 1)  Levoxyl 88 Mcg Tabs (Levothyroxine Sodium) .Marland Kitchen.. 1 By Mouth Daily 2)  Glynase 3 Mg Tabs (Glyburide Micronized) .Marland Kitchen.. 1 By Mouth Two Times A Day 3)  Coumadin 5 Mg Tabs (Warfarin Sodium) .... Use As Directed 4)  Diovan 80 Mg Tabs (Valsartan) .Marland Kitchen.. 1 Daily By Mouth 5)  Nystatin 100000 Unit/gm Crea (Nystatin) .... Apply To Area Two Times A Day For Two Weeks Minimum. 6)  Clotrimazole-Betamethasone 1-0.05 % Crea (Clotrimazole-Betamethasone) .... Apply To Rash Twice A Day Sparingly 7)  Multivitamins  Caps (Multiple Vitamin) .... Take 1 By Mouth Once Daily 8)  Proair Hfa 108 (90 Base) Mcg/act  Aers (Albuterol Sulfate) .... 2 Puffs Every 4-6 Hours As Needed 9)  Meclizine Hcl 25 Mg Tabs (Meclizine Hcl) .... One Tab By Mouth Every 6hrs For Dizziness 10)  Verapamil Hcl 40 Mg Tabs (Verapamil Hcl) .... One Tab By Mouth in Am  Allergies (verified): 1)  !  Sulfasalazine (Sulfasalazine) 2)  ! * Narcotics 3)  ! * Claritin  Past History:  Past Medical History: Last updated: 02/24/2009 Atrial fibrillation-on chronic coumadin. Congestive heart failure, diastolic   --echo 12/09 EF 60% moderate diastolic dysfunction mild AS  Diabetes mellitus, type II Hyperlipidemia Hypothyroidism  Past Surgical History: Last updated: 05/26/2008 1945              Appendectomy                             Part Hyst Ovaries Intact                       Pneumothorax  Pleural Fluid B9                       Afib  Spont Resolved 1/98                Stress cardiolite  - nml 1/98                Echo nml except for mild A.S/AI 5/99                Thoracentesis - nml. except EOS due to pleural effusion 1/00                Echo, no change 1/00  Sleep study (-) ? GERD 08/03/02           DEXA - osteopenia 9/9 - 05/06/03   Chest pain - R/O'd A.Fib 05/06/03           Adenosine Cardiolite - nml. 05/06/03           Echo - EF 60% 1/3 - 09/02/03     RAD/Bronchitis 10/01/05             Echo  EF 55%  T.R. 9/27-9/28/09   HOSP [facility]  Syncope R/o'd ARI   Family History: Last updated: 08/15/2008 Father: D. 48 thrombosis of heart Mother: D. 70 O.D, pill popping in [place]. 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  worked at [employer] in [place], [place] Former Smoker, 1/2 PPD x 40 years, quit 1988 Alcohol use-no  Risk Factors: Alcohol Use: 0 (08/23/2009) Caffeine Use: 1 (08/23/2009) Exercise: no (08/23/2009)  Risk Factors: Smoking Status: quit (08/23/2009) Passive Smoke Exposure: no (08/23/2009)  Review of Systems  The patient denies fever,  weight loss, weight gain, vision loss, decreased hearing, hoarseness, chest pain, syncope, dyspnea on exertion, peripheral edema, prolonged cough, abdominal pain, incontinence, muscle weakness, depression, and enlarged lymph nodes.         malaise, fatigue  Vital Signs:  Patient profile:   77 year old female Height:      64 inches Weight:      152 pounds BMI:     26.19 Pulse rate:   74 / minute BP sitting:   134 / 75  (left arm) Cuff size:   regular  Vitals Entered By: Christine Stone, CMA (February 21, 2010 3:04 PM)  Physical Exam  General:  Well developed, well nourished, in no acute distress. Head:  normocephalic and atraumatic Neck:  Neck supple, no JVD. No masses, thyromegaly or abnormal cervical nodes. Chest Wall:  no deformities or breast masses noted Lungs:  Clear bilaterally to auscultation and percussion. Heart:  Non-displaced PMI, chest non-tender; regular rate and rhythm, S1, S2 with I-II/VI SEM RSB, no rubs or gallops. Carotid upstroke normal, no bruit. Pedals normal pulses. No edema, no varicosities. Abdomen:  Bowel sounds positive; abdomen soft and non-tender without masses Msk:  Back normal, normal gait. Muscle strength and tone normal. Pulses:  pulses normal in all 4 extremities Extremities:  No clubbing or cyanosis. Neurologic:  Alert and oriented x 3. Skin:  Intact without lesions or rashes. Psych:  Normal affect.   Impression & Recommendations:  Problem # 1:  FATIGUE / MALAISE (ICD-780.79) etiology of her symptoms is unknown. Her heart rate is a little higher on the lower dose of verapamil. We will not increase it back to 80 mg as she feels that it might be helping at the lower dose. She continues to exercise on her bike with no symptoms or shortness of breath or chest discomfort. Most of her symptoms are somewhat atypical, coming on at rest. We had suggested to her that we continue to monitor her for now. If she has worsening symptoms, particularly with exertion, we  could perform a stress test, possibly even routine blood work.  Problem # 2:  AORTIC STENOSIS (ICD-424.1) audible murmur  on exam. This has been very mild in previous years by echocardiogram  The following medications were removed from the medication list:    Diovan 80 Mg Tabs (Valsartan) .Marland Kitchen... 1 daily by mouth Her updated medication list for this problem includes:    Losartan Potassium 50 Mg Tabs (Losartan potassium) .Marland Kitchen... Take 1 tablet by mouth once a day  Problem # 3:  HYPERTENSION, BENIGN (ICD-401.1) She commented that her Diovan is very expensive. We will change this to losartan 50 mg daily  The following medications were removed from the medication list:    Diovan 80 Mg Tabs (Valsartan) .Marland Kitchen... 1 daily by mouth Her updated medication list for this problem includes:    Verapamil Hcl 40 Mg Tabs (Verapamil hcl) ..... One tab by mouth in am    Losartan Potassium 50 Mg Tabs (Losartan potassium) .Marland Kitchen... Take 1 tablet by mouth once a day  Patient Instructions: 1)  Your physician recommends that you schedule a follow-up appointment in: Keep your follow-up with Bensimhon in August 2)  Your physician has recommended you make the following change in your medication: STOP diovan and START losartan  Prescriptions: LOSARTAN POTASSIUM 50 MG TABS (LOSARTAN POTASSIUM) Take 1 tablet by mouth once a day  #30 x 6   Entered by:   Christine Needy, RN   Authorized by:   Dossie Arbour MD   Signed by:   Christine Needy, RN on 02/21/2010   Method used:   Electronically to        [facility] [address] (retail)       [address]       [place], [place]  [address]       Ph: [phone]       Fax: [phone]   RxID:   [id number]   Appended Document: EC/GLC Atrial Fibrillation: Will continue verapamil 40 mg daily. I have suggested that she monitor her heart rate with exertion.

## 2010-09-25 NOTE — Medication Information (Signed)
Summary: [facility] Order  [facility] Order   Imported By: Beau Fanny 02/05/2010 09:25:45  _____________________________________________________________________  External Attachment:    Type:   Image     Comment:   External Document

## 2010-09-25 NOTE — Assessment & Plan Note (Signed)
Summary: DIZZY/CLE   Vital Signs:  Patient profile:   77 year old female Weight:      155.75 pounds O2 Sat:      98 % on Room air Temp:     97.5 degrees F oral Pulse rate:   92 / minute Pulse rhythm:   irregular BP sitting:   150 / 90  (left arm) Cuff size:   regular  Vitals Entered By: Sydell Axon LPN (October 18, 2009 11:21 AM)  O2 Flow:  Room air CC: Dizzy X 2 days, especially when bending over   History of Present Illness: Pt here for dizziness since Mon. Monday when getting up out of bed was dizzy and had to sit on the side  of the bed. Bending over causes "spinning". Her BS was nml, her BP was ok...she had been taking Claritin and stopped it the day before.  She has had lots of problems with sinuses dripping water from the nose. She also complains of the "misery" of head fullness. She thinks the Claritin may have helped this and she does have sneeezing and itchy eyes.  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) 10)  Shortness of Breath  (ICD-786.05) 11)  Allergic Rhinitis  (ICD-477.9) 12)  Pleural Effusion  (ICD-511.9) 13)  Stasis Changes, Le's  () 14)  Hrt  (ICD-V07.4) 15)  Postmenopausal Bleeding  (ICD-627.1) 16)  Hypothyroidism  (ICD-244.9) 17)  Hyperlipidemia  (ICD-272.4) 18)  Diabetes Mellitus, Type II  (ICD-250.00) 19)  Congestive Heart Failure  (ICD-428.0) 20)  Encounter For Therapeutic Drug Monitoring  (ICD-V58.83) 21)  Anticoagulation Therapy  (ICD-V58.61) 22)  Atrial Fibrillation -chronic  (ICD-427.31)  Medications Prior to Update: 1)  Levoxyl 88 Mcg Tabs (Levothyroxine Sodium) .Marland Kitchen.. 1 By Mouth Daily 2)  Verapamil Hcl 80 Mg  Tabs (Verapamil Hcl) ....  Take 1 Tablet By Mouth Daily 3)  Glynase 3 Mg Tabs (Glyburide Micronized) .Marland Kitchen.. 1 By Mouth Two Times A Day 4)  Coumadin 5 Mg Tabs (Warfarin Sodium) .... Use As Directed 5)  Diovan 80 Mg Tabs (Valsartan) .Marland Kitchen.. 1 Daily By Mouth 6)  Nystatin 100000 Unit/gm Crea (Nystatin) .... Apply To Area Two Times A Day For Two Weeks Minimum. 7)  Clotrimazole-Betamethasone 1-0.05 % Crea (Clotrimazole-Betamethasone) .... Apply To Rash Twice A Day Sparingly 8)  Multivitamins  Caps (Multiple Vitamin) .... Take 1 By Mouth Once Daily 9)  Proair Hfa 108 (90 Base) Mcg/act  Aers (Albuterol Sulfate) .... 2 Puffs Every 4-6 Hours As Needed 10)  Cortisporin-Tc 3.10-26-08-0.5 Mg/ml Susp (Neomycin-Colist-Hc-Thonzonium) .... 3  Drops in Affected Ear Qid While Awake  Allergies: 1)  ! Sulfasalazine (Sulfasalazine) 2)  ! * Narcotics 3)  ! * Claritin  Physical Exam  General:  Well-developed,well-nourished,in no acute distress; alert,appropriate and cooperative throughout examination Head:  normocephalic, atraumatic, and no abnormalities observed.  Sinuses NT. Eyes:  vision grossly intact, pupils equal, pupils round, pupils reactive to light, and no injection.   Ears:  External ear exam shows no significant lesions or deformities.  Otoscopic examination reveals clear canals, tympanic membranes are intact bilaterally without bulging, retraction, inflammation or discharge. Hearing is grossly normal bilaterally. Nose:  External nasal examination shows no deformity or inflammation. Nasal  mucosa are pink and moist without lesions or exudates. Mouth:  pharynx pink and moist, no erythema, and no exudates.   Neck:  No deformities, masses, or tenderness noted. Lungs:  Normal respiratory effort, chest expands symmetrically. Lungs are clear to auscultation, no crackles or wheezes. Heart:  Normal rate and regular rhythm. S1 and S2 normal without gallop, murmur, click, rub or other extra sounds.   Impression & Recommendations:  Problem #  1:  ALLERGIC RHINITIS (ICD-477.9)  ?causing head congestion? Has been on nasal inhalers in the past. Back on Claritin.  Given Singulair samps, 10mg  to take daily for one month.  Discussed use of allergy medications and environmental measures.   Problem # 2:  VERTIGO (ICD-780.4) Assessment: New  Based on sxs. Try Meclizine every 6 hrs if dizzy. Warned about sleepiness. Her updated medication list for this problem includes:    Meclizine Hcl 25 Mg Tabs (Meclizine hcl) ..... One tab by mouth every 6hrs for dizziness  Demonstrated maneuvers to self-treat vertigo. Patient to call to be seen if no improvement in 10-14 days, sooner if worse.   Complete Medication List: 1)  Levoxyl 88 Mcg Tabs (Levothyroxine sodium) .Marland Kitchen.. 1 by mouth daily 2)  Verapamil Hcl 80 Mg Tabs (Verapamil hcl) .... Take 1 tablet by mouth daily 3)  Glynase 3 Mg Tabs (Glyburide micronized) .Marland Kitchen.. 1 by mouth two times a day 4)  Coumadin 5 Mg Tabs (Warfarin sodium) .... Use as directed 5)  Diovan 80 Mg Tabs (Valsartan) .Marland Kitchen.. 1 daily by mouth 6)  Nystatin 100000 Unit/gm Crea (Nystatin) .... Apply to area two times a day for two weeks minimum. 7)  Clotrimazole-betamethasone 1-0.05 % Crea (Clotrimazole-betamethasone) .... Apply to rash twice a day sparingly 8)  Multivitamins Caps (Multiple vitamin) .... Take 1 by mouth once daily 9)  Proair Hfa 108 (90 Base) Mcg/act Aers (Albuterol sulfate) .... 2 puffs every 4-6 hours as needed 10)  Cortisporin-tc 3.10-26-08-0.5 Mg/ml Susp (Neomycin-colist-hc-thonzonium) .... 3  drops in affected ear four times a day while awake 11)  Meclizine Hcl 25 Mg Tabs (Meclizine hcl) .... One tab by mouth every 6hrs for dizziness 12)  Singulair 10 Mg Tabs (Montelukast sodium) .... One tab by mouth once daily  Patient Instructions: 1)  RTC one month , check head fullness and BP 2)  Take Singulair 10mg  daily regularlyTake Claritin 10mg  daily 3)  May try steroids next time. Prescriptions: MECLIZINE HCL 25 MG  TABS (MECLIZINE HCL) one tab by mouth every 6hrs for dizziness  #30 x 0   Entered and Authorized by:   Shaune Leeks MD   Signed by:   Shaune Leeks MD on 10/18/2009   Method used:   Electronically to        [facility] (retail)       [address]       [place], [place]  [address]       Ph: [phone]       Fax: [phone]   RxID:   [id number]   Prior Medications (reviewed today): LEVOXYL 88 MCG TABS (LEVOTHYROXINE SODIUM) 1 by mouth daily VERAPAMIL HCL 80 MG  TABS (VERAPAMIL HCL) Take 1 tablet by mouth daily GLYNASE 3 MG TABS (GLYBURIDE MICRONIZED) 1 by mouth two times a day COUMADIN 5 MG TABS (WARFARIN SODIUM) use as directed DIOVAN 80 MG TABS (VALSARTAN) 1 daily by mouth NYSTATIN 100000 UNIT/GM CREA (NYSTATIN) apply to area two  times a day for two weeks minimum. CLOTRIMAZOLE-BETAMETHASONE 1-0.05 % CREA (CLOTRIMAZOLE-BETAMETHASONE) apply to rash twice a day sparingly MULTIVITAMINS  CAPS (MULTIPLE VITAMIN) Take 1 by mouth once daily PROAIR HFA 108 (90 BASE) MCG/ACT  AERS (ALBUTEROL SULFATE) 2 puffs every 4-6 hours as needed CORTISPORIN-TC 3.10-26-08-0.5 MG/ML SUSP (NEOMYCIN-COLIST-HC-THONZONIUM) 3  drops in affected ear four times a day while awake Current Allergies (reviewed today): ! SULFASALAZINE (SULFASALAZINE) ! * NARCOTICS ! * CLARITIN

## 2010-09-25 NOTE — Progress Notes (Signed)
Summary: PHONE NOTE   Phone Note Call from Patient Call back at Home Phone [phone]   Caller: Patient Call For: GOLLAN Summary of Call: PATIENT CALLED AND SHE IS STILL VERY WEAK AND LIGHTHEADED.  PATIENT STATED SHE FELT BETTER AFTER SEEING DR Mariah Milling IN JULY BUT IS BACK TO FEELING WORSE THAN BEFORE.  PLEASE ADVISE. Initial call taken by: [place],  April 17, 2010 11:36 AM  Follow-up for Phone Call        Pt reports being weak and dizzy. [facility] to have BP checked she will call me with results. Benedict Needy, RN  April 17, 2010 2:29 PM   HR 78 BP 135/71 asked  pt to call pcp and let them know about her reports of weakness and dizziness. HR and BP are not the issue. Please advise if any other testing you want to do.  Follow-up by: Benedict Needy, RN,  April 17, 2010 4:25 PM  Additional Follow-up for Phone Call Additional follow up Details #1::        we could do routine blood work, or her pmd could also do this CBC, BMP, tsh, lfts     Appended Document: PHONE NOTE pt coming in for blood work 8/29

## 2010-09-25 NOTE — Assessment & Plan Note (Signed)
Summary: WEAKNESS AND NAUSEA / LFW   Vital Signs:  Patient profile:   77 year old female Weight:      151.25 pounds O2 Sat:      97 % on Room air Temp:     97.9 degrees F oral Pulse rate:   72 / minute Pulse rhythm:   irregular BP sitting:   108 / 72  (left arm) Cuff size:   regular  Vitals Entered By: Sydell Axon LPN (February 14, 2010 11:09 AM)  O2 Flow:  Room air CC: Lightheaded for several months, weakness and nausea off and on   History of Present Illness: Pt here again for lightheadedness which she says is not like dizzy but throws her off balance. She hasn't been doing much for a month "because she can't" This is different than the vertigo she had. She feels like she has "feathers inb her stomach" ...she vomited once over the weekend. Her vision is fine, her appetite is off...eats because she has to. She has no chest pain, minimal SOB at times but "not that much" Her stomach doesn't bother her except for anb occas tinge of nausea that "is not extreme" She doesn't know what is wrong but she worries about heart failure...her sister had heart failure. Her sister died of CHF. She hasn't seen Dr Jones Broom in about 6 mos. She has no trouble breathing, her PO2 is nml. She bumps into things. She was in the shower a few days ago and things"got dark". She didn't fall and was able to get out of the tub w/o falling. Bms and urination are nml without constipation, diarrhea, dysuria or burning.  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) 11)  Shortness of Breath  (ICD-786.05) 12)  Allergic Rhinitis  (ICD-477.9) 13)  Pleural Effusion  (ICD-511.9) 14)  Stasis Changes,  Le's  () 15)  Hrt  (ICD-V07.4) 16)  Postmenopausal Bleeding  (ICD-627.1) 17)  Hypothyroidism  (ICD-244.9) 18)  Hyperlipidemia  (ICD-272.4) 19)  Diabetes Mellitus, Type II  (ICD-250.00) 20)  Congestive Heart Failure  (ICD-428.0) 21)  Encounter For Therapeutic Drug Monitoring  (ICD-V58.83) 22)  Anticoagulation Therapy  (ICD-V58.61) 23)  Atrial Fibrillation -chronic  (ICD-427.31)  Medications Prior to Update: 1)  Levoxyl 88 Mcg Tabs (Levothyroxine Sodium) .Marland Kitchen.. 1 By Mouth Daily 2)  Verapamil Hcl 80 Mg  Tabs (Verapamil Hcl) .... Take 1 Tablet By Mouth Daily 3)  Glynase 3 Mg Tabs (Glyburide Micronized) .Marland Kitchen.. 1 By Mouth Two Times A Day 4)  Coumadin 5 Mg Tabs (Warfarin Sodium) .... Use As Directed 5)  Diovan 80 Mg Tabs (Valsartan) .Marland Kitchen.. 1 Daily By Mouth 6)  Nystatin 100000 Unit/gm Crea (Nystatin) .... Apply To Area Two Times A Day For Two Weeks Minimum. 7)  Clotrimazole-Betamethasone 1-0.05 % Crea (Clotrimazole-Betamethasone) .... Apply To Rash Twice A Day Sparingly 8)  Multivitamins  Caps (Multiple Vitamin) .... Take 1 By Mouth Once Daily 9)  Proair Hfa 108 (90 Base) Mcg/act  Aers (Albuterol Sulfate) .... 2 Puffs Every 4-6 Hours As Needed 10)  Cortisporin-Tc 3.10-26-08-0.5 Mg/ml Susp (Neomycin-Colist-Hc-Thonzonium) .... 3  Drops in Affected Ear Four Times A Day While Awake 11)  Meclizine Hcl 25 Mg  Tabs (Meclizine Hcl) .... One Tab By Mouth Every 6hrs For Dizziness 12)  Singulair 10 Mg Tabs (Montelukast Sodium) .... One Tab By Mouth Once Daily 13)  Macrobid 100 Mg Caps (Nitrofurantoin Monohyd Macro) .... One Tab By Mouth Two Times A Day 14)  Pyridium 200 Mg Tabs (Phenazopyridine Hcl) .... One Tab By Mouth Three Times A Day As Needed Burning With Urination.  Allergies: 1)  ! Sulfasalazine (Sulfasalazine) 2)  ! * Narcotics 3)  ! * Claritin  Physical Exam  General:  Well-developed,well-nourished,in no acute distress; alert,appropriate and cooperative throughout examination Head:  normocephalic,  atraumatic, and no abnormalities observed.  Sinuses NT. Eyes:  vision grossly intact, pupils equal, pupils round, pupils reactive to light, and no injection.   Ears:  External ear exam shows no significant lesions or deformities.  Otoscopic examination reveals clear canals, tympanic membranes are intact bilaterally without bulging, retraction, inflammation or discharge. Hearing is grossly normal bilaterally. Nose:  External nasal examination shows no deformity or inflammation. Nasal mucosa are pink and moist without lesions or exudates. Mouth:  pharynx pink and moist, no erythema, and no exudates.   Neck:  No deformities, masses, or tenderness noted. Chest Wall:  No deformities, masses, or tenderness noted. Lungs:  Normal respiratory effort, chest expands symmetrically. Lungs are clear to auscultation, no crackles or wheezes. Heart:  Slightly irregular rate and irreg irreg rhytrhm, no extra sounds heard today. Extremities:  No clubbing, cyanosis, edema, or deformity noted with reasonably normal full range of motion of all joints.   Neurologic:  Grossly nml. No cranial nerve deficits noted. Station and gait are normal. Sensory, motor and coordinative functions appear intact. Skin:  Intact without suspicious lesions or rashes, waqrm and well vascularized. Psych:  normal affect, talkative and pleasant    Impression & Recommendations:  Problem # 1:  ATRIAL FIBRILLATION -CHRONIC (ICD-427.31) Assessment Unchanged  Get EKG. RAte well controlled. EKG signif for episodes of bradycardia. Decrease Verapamil and have pt seen by Dr Jones Broom in followup.  The following medications were removed from the medication list:    Verapamil Hcl 80 Mg Tabs (Verapamil hcl) .Marland Kitchen... Take 1 tablet by mouth daily Her updated medication list for this problem includes:    Coumadin 5 Mg Tabs (Warfarin sodium) ..... Use as directed    Verapamil Hcl 40 Mg Tabs (Verapamil hcl) ..... One tab by mouth in am  Reviewed the  following: PT: 30.1 (02/13/2010)   INR: 2.5 (02/13/2010) Next Protime: 4 weeks (dated on 02/13/2010)  Problem # 2:  POSTURAL LIGHTHEADEDNESS (ICD-780.4) Assessment: New  Presumably due to episodes of relative bradycardia. Acutely decrease Verapamil. Stable enough to be seen by Dr Jones Broom in office.  To ER if sxs significantly worsen. Her updated medication list for this problem includes:    Meclizine Hcl 25 Mg Tabs (Meclizine hcl) ..... One tab by mouth every 6hrs for dizziness  Orders: Cardiology Referral (Cardiology)  Problem # 3:  EMPHYSEMA (ICD-492.8) Assessment: Unchanged Seems stqable. PO2 today 97%.  Problem # 4:  CONGESTIVE HEART FAILURE (ICD-428.0) Assessment: Unchanged  No current signs of overload. Pt worried about this specifically. reassured. Her updated medication list for this problem includes:    Coumadin 5 Mg Tabs (Warfarin sodium) ..... Use as directed    Diovan 80 Mg Tabs (Valsartan) .Marland Kitchen... 1 daily by mouth  Echocardiogram:  SUMMARY   -  Overall left ventricular systolic function was normal. Left         ventricular ejection fraction was estimated to  be 60 %. There         were no left ventricular regional wall motion abnormalities.         Left ventricular wall thickness was increased in a pattern of         mild concentric hypertrophy. Features were consistent with a         pseudonormal left ventricular filling pattern, with         concomitant abnormal relaxation and increased filling         pressure.   -  Mild aortic stenosis with mean gradient 10.5 mmHg and aortic         valve area 1.7 cm2. The aortic valve was moderately         calcified.   -  There was mild mitral annular calcification. There was mild         mitral valvular regurgitation.   -  The left atrium was mildly dilated.   -  Estimated peak pulmonary artery systolic pressure was 33 mmHg.   -  The right atrium was mildly dilated.    IMPRESSIONS   -  Mild LV hypertrophy with  moderate diastolic dysfunction and         normal systolic function, EF 60%. No regional wall motion         abnormalities.   -  Minimal aortic stenosis. (08/23/2008)  Complete Medication List: 1)  Levoxyl 88 Mcg Tabs (Levothyroxine sodium) .Marland Kitchen.. 1 by mouth daily 2)  Glynase 3 Mg Tabs (Glyburide micronized) .Marland Kitchen.. 1 by mouth two times a day 3)  Coumadin 5 Mg Tabs (Warfarin sodium) .... Use as directed 4)  Diovan 80 Mg Tabs (Valsartan) .Marland Kitchen.. 1 daily by mouth 5)  Nystatin 100000 Unit/gm Crea (Nystatin) .... Apply to area two times a day for two weeks minimum. 6)  Clotrimazole-betamethasone 1-0.05 % Crea (Clotrimazole-betamethasone) .... Apply to rash twice a day sparingly 7)  Multivitamins Caps (Multiple vitamin) .... Take 1 by mouth once daily 8)  Proair Hfa 108 (90 Base) Mcg/act Aers (Albuterol sulfate) .... 2 puffs every 4-6 hours as needed 9)  Cortisporin-tc 3.10-26-08-0.5 Mg/ml Susp (Neomycin-colist-hc-thonzonium) .... 3  drops in affected ear four times a day while awake 10)  Meclizine Hcl 25 Mg Tabs (Meclizine hcl) .... One tab by mouth every 6hrs for dizziness 11)  Singulair 10 Mg Tabs (Montelukast sodium) .... One tab by mouth once daily 12)  Verapamil Hcl 40 Mg Tabs (Verapamil hcl) .... One tab by mouth in am  Patient Instructions: 1)  Please get pt appt again with Dr Jones Broom for lightheadedness poss due to bradycardia.  Prescriptions: VERAPAMIL HCL 40 MG TABS (VERAPAMIL HCL) one tab by mouth in AM  #30 x 12   Entered and Authorized by:   Shaune Leeks MD   Signed by:   Shaune Leeks MD on 02/14/2010   Method used:   Electronically to        [facility]  [address] (retail)       [address]       [place], [place]  [address]       Ph: [phone]       Fax: [phone]   RxID:   [id number]   Current Allergies (reviewed today): ! SULFASALAZINE (SULFASALAZINE) ! * NARCOTICS ! * CLARITIN

## 2010-09-26 ENCOUNTER — Ambulatory Visit (INDEPENDENT_AMBULATORY_CARE_PROVIDER_SITE_OTHER): Payer: Medicare Other

## 2010-09-26 ENCOUNTER — Ambulatory Visit: Admit: 2010-09-26 | Payer: Self-pay | Admitting: Family Medicine

## 2010-09-26 ENCOUNTER — Encounter: Payer: Self-pay | Admitting: Family Medicine

## 2010-09-26 DIAGNOSIS — Z7901 Long term (current) use of anticoagulants: Secondary | ICD-10-CM

## 2010-09-26 DIAGNOSIS — I4891 Unspecified atrial fibrillation: Secondary | ICD-10-CM

## 2010-09-26 DIAGNOSIS — N390 Urinary tract infection, site not specified: Secondary | ICD-10-CM | POA: Insufficient documentation

## 2010-09-26 DIAGNOSIS — Z5181 Encounter for therapeutic drug level monitoring: Secondary | ICD-10-CM

## 2010-09-26 LAB — CONVERTED CEMR LAB
INR: 2.5
Prothrombin Time: 29.9 s

## 2010-09-27 ENCOUNTER — Encounter: Payer: Self-pay | Admitting: Family Medicine
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
## 2010-09-27 NOTE — Progress Notes (Signed)
Summary: ??cipro  Phone Note Call from Patient Call back at Home Phone [phone]   Caller: Patient Call For: Shaune Leeks MD Summary of Call: Patient was seen at urgent care on sunday for a uti . They gave her rx for cipro 250 mg for 6 days. She wants to make sure this is okay for her to take with being on coumadin. Please advise.  Initial call taken by: Melody Comas,  September 18, 2010 3:58 PM  Follow-up for Phone Call        Should have her Coumadin checked the last day of her Ab. The Coumadin will affect her Protime, but just about any Ab has the possibility of doing that. Follow-up by: Shaune Leeks MD,  September 19, 2010 7:12 AM  Additional Follow-up for Phone Call Additional follow up Details #1::        Patient advised. Appt scheduled for protime.  Additional Follow-up by: Melody Comas,  September 19, 2010 8:33 AM

## 2010-09-27 NOTE — Letter (Signed)
Summary: Nature conservation officer [employer] Wellness Visit Questionnaire   [employer] Annual Wellness Visit Questionnaire   Imported By: Beau Fanny 09/05/2010 16:52:41  _____________________________________________________________________  External Attachment:    Type:   Image     Comment:   External Document

## 2010-10-03 NOTE — Medication Information (Signed)
Summary: protime/tw   PCP: Shaune Leeks MD Indication 1: Atrial fibrillation PT 29.9 INR RANGE 2.0-3.5           Allergies: 1)  ! Sulfasalazine (Sulfasalazine) 2)  ! * Narcotics 3)  ! * Claritin  Anticoagulation Management History:      Positive risk factors for bleeding include an age of 75 years or older and presence of serious comorbidities.  The bleeding index is 'intermediate risk'.  Positive CHADS2 values include History of CHF, History of HTN, Age > 19 years old, and History of Diabetes.  Her last INR was 1.6 and today's INR is 2.5.  Prothrombin time is 29.9.    Anticoagulation Management Assessment/Plan:      The patient's current anticoagulation dose is Coumadin 5 mg tabs: use as directed.  The next INR is due 2 weeks.         ANTICOAGULATION RECORD PREVIOUS REGIMEN & LAB RESULTS Anticoagulation Diagnosis:  Atrial fibrillation on  08/31/2009 Previous INR Goal Range:  2.0-3.5 on  08/31/2009 Previous INR:  1.6 on  09/12/2010 Previous Coumadin Dose(mg):  2.5mg  daily, 5mg  wed on  07/11/2010 Previous Regimen:  2.5mg  qd, 5mg  Wed,Fri on  09/12/2010 Previous Coagulation Comments:  . on  09/28/2009  NEW REGIMEN & LAB RESULTS Current INR: 2.5 Regimen: 2.5mg  qd, 5mg  Wed,Fri  (no change)  Provider: Leira Regino      Repeat testing in: 2 weeks MEDICATIONS LEVOXYL 88 MCG TABS (LEVOTHYROXINE SODIUM) 1 by mouth daily [BMN] GLYNASE 3 MG TABS (GLYBURIDE MICRONIZED) 1 by mouth two times a day COUMADIN 5 MG TABS (WARFARIN SODIUM) use as directed MULTIVITAMINS  CAPS (MULTIPLE VITAMIN) Take 1 by mouth once daily PROAIR HFA 108 (90 BASE) MCG/ACT  AERS (ALBUTEROL SULFATE) 2 puffs every 4-6 hours as needed VERAPAMIL HCL 40 MG TABS (VERAPAMIL HCL) one tab by mouth in AM LOSARTAN POTASSIUM 50 MG TABS (LOSARTAN POTASSIUM) Take 1 tablet by mouth once a day  Dose has been reviewed with patient or caretaker during this visit.  Reviewed by: Allison Quarry  Anticoagulation Visit  Questionnaire      Coumadin dose missed/changed:  No      Abnormal Bleeding Symptoms:  No Any diet changes including alcohol intake, vegetables or greens since the last visit:  No Any illnesses or hospitalizations since the last visit:  Yes      Recent Illness/Hospitalizations:  uti last week, very sick. Finished antibiotics 3 days ago. sent urine for c&s today per Dr  Hetty Ely Any signs of clotting since the last visit (including chest discomfort, dizziness, shortness of breath, arm tingling, slurred speech, swelling or redness in leg):  No    Laboratory Results   Blood Tests   Date/Time Recieved: September 26, 2010 12:06 PM  Date/Time Reported: September 26, 2010 12:06 PM   PT: 29.9 s   (Normal Range: 10.6-13.4)  INR: 2.5   (Normal Range: 0.88-1.12   Therap INR: 2.0-3.5)

## 2010-10-10 ENCOUNTER — Encounter: Payer: Self-pay | Admitting: Family Medicine

## 2010-10-10 ENCOUNTER — Ambulatory Visit (INDEPENDENT_AMBULATORY_CARE_PROVIDER_SITE_OTHER): Payer: Medicare Other

## 2010-10-10 DIAGNOSIS — I4891 Unspecified atrial fibrillation: Secondary | ICD-10-CM

## 2010-10-10 DIAGNOSIS — Z7901 Long term (current) use of anticoagulants: Secondary | ICD-10-CM

## 2010-10-10 DIAGNOSIS — Z5181 Encounter for therapeutic drug level monitoring: Secondary | ICD-10-CM

## 2010-10-10 LAB — CONVERTED CEMR LAB
INR: 3.3
Prothrombin Time: 39.2 s

## 2010-10-24 ENCOUNTER — Ambulatory Visit (INDEPENDENT_AMBULATORY_CARE_PROVIDER_SITE_OTHER): Payer: Medicare Other

## 2010-10-24 ENCOUNTER — Encounter (INDEPENDENT_AMBULATORY_CARE_PROVIDER_SITE_OTHER): Payer: Self-pay | Admitting: *Deleted

## 2010-10-24 DIAGNOSIS — N390 Urinary tract infection, site not specified: Secondary | ICD-10-CM

## 2010-10-26 ENCOUNTER — Encounter: Payer: Self-pay | Admitting: Family Medicine

## 2010-11-01 NOTE — Assessment & Plan Note (Signed)
Summary: urine/tsc   Nurse Visit   Allergies: 1)  ! Sulfasalazine (Sulfasalazine) 2)  ! * Narcotics 3)  ! * Claritin  Orders Added: 1)  Specimen Handling [99000] 2)  T-Culture, Urine [[id number]]

## 2010-11-07 ENCOUNTER — Encounter: Payer: Self-pay | Admitting: Family Medicine

## 2010-11-07 ENCOUNTER — Ambulatory Visit (INDEPENDENT_AMBULATORY_CARE_PROVIDER_SITE_OTHER): Payer: Medicare Other

## 2010-11-07 DIAGNOSIS — Z5181 Encounter for therapeutic drug level monitoring: Secondary | ICD-10-CM

## 2010-11-07 DIAGNOSIS — I4891 Unspecified atrial fibrillation: Secondary | ICD-10-CM

## 2010-11-07 DIAGNOSIS — Z7901 Long term (current) use of anticoagulants: Secondary | ICD-10-CM

## 2010-11-07 LAB — CONVERTED CEMR LAB: INR: 2.5

## 2010-11-13 NOTE — Medication Information (Signed)
Summary: protime/tw   PCP: Shaune Leeks MD Indication 1: Atrial fibrillation INR RANGE 2.0-3.5           Allergies: 1)  ! Sulfasalazine (Sulfasalazine) 2)  ! * Narcotics 3)  ! * Claritin  Anticoagulation Management History:      Positive risk factors for bleeding include an age of 38 years or older and presence of serious comorbidities.  The bleeding index is 'intermediate risk'.  Positive CHADS2 values include History of CHF, History of HTN, Age > 8 years old, and History of Diabetes.  Her last INR was 3.3 and today's INR is 2.5.    Anticoagulation Management Assessment/Plan:      The patient's current anticoagulation dose is Coumadin 5 mg tabs: use as directed.  The next INR is due 4 weeks.         ANTICOAGULATION RECORD PREVIOUS REGIMEN & LAB RESULTS Anticoagulation Diagnosis:  Atrial fibrillation on  08/31/2009 Previous INR Goal Range:  2.0-3.5 on  08/31/2009 Previous INR:  3.3 on  10/10/2010 Previous Coumadin Dose(mg):  2.5mg  daily, 5mg  wed on  07/11/2010 Previous Regimen:  2.5mg  qd, 5mg  Wed,Fri on  09/12/2010 Previous Coagulation Comments:  . on  09/28/2009  NEW REGIMEN & LAB RESULTS Current INR: 2.5 Current Coumadin Dose(mg): 2.5mg  qd, 5mg  Wed,Fri Regimen: 2.5mg  qd, 5mg  Wed,Fri  Provider: Paetyn Stone Repeat testing in: 4 weeks Dose has been reviewed with patient or caretaker during this visit. Reviewed by: Christine Stone  Anticoagulation Visit Questionnaire Coumadin dose missed/changed:  No Abnormal Bleeding Symptoms:  No  Any diet changes including alcohol intake, vegetables or greens since the last visit:  No Any illnesses or hospitalizations since the last visit:  No Any signs of clotting since the last visit (including chest discomfort, dizziness, shortness of breath, arm tingling, slurred speech, swelling or redness in leg):  No  MEDICATIONS LEVOXYL 88 MCG TABS (LEVOTHYROXINE SODIUM) 1 by mouth daily [BMN] GLYNASE 3 MG TABS (GLYBURIDE  MICRONIZED) 1 by mouth two times a day COUMADIN 5 MG TABS (WARFARIN SODIUM) use as directed MULTIVITAMINS  CAPS (MULTIPLE VITAMIN) Take 1 by mouth once daily PROAIR HFA 108 (90 BASE) MCG/ACT  AERS (ALBUTEROL SULFATE) 2 puffs every 4-6 hours as needed VERAPAMIL HCL 40 MG TABS (VERAPAMIL HCL) one tab by mouth in AM LOSARTAN POTASSIUM 50 MG TABS (LOSARTAN POTASSIUM) Take 1 tablet by mouth once a day AMPICILLIN 250 MG CAPS (AMPICILLIN) one tab by mouth three times a day    Phone Note Call from Patient Call back at Home Phone [phone]   Caller: Patient Call For: Shaune Leeks MD Summary of Call: FYI Pt c/o deep cough,  and weakness, occassionally coughs up yellow sputum. She also has allergies that cause clear deainage into throat that makes her cough, Not sure if the two are related or seperate issues, she is taking robitussin cough syrup with expectorant, will make appt if no imp. Initial call taken by: Christine Comber CMA Duncan Dull),  November 07, 2010 9:50 AM

## 2010-11-21 ENCOUNTER — Ambulatory Visit: Payer: Medicare Other

## 2010-12-05 ENCOUNTER — Ambulatory Visit (INDEPENDENT_AMBULATORY_CARE_PROVIDER_SITE_OTHER): Payer: Medicare Other | Admitting: Family Medicine

## 2010-12-05 DIAGNOSIS — Z5181 Encounter for therapeutic drug level monitoring: Secondary | ICD-10-CM

## 2010-12-05 DIAGNOSIS — Z7901 Long term (current) use of anticoagulants: Secondary | ICD-10-CM

## 2010-12-05 DIAGNOSIS — I4891 Unspecified atrial fibrillation: Secondary | ICD-10-CM

## 2011-01-01 ENCOUNTER — Ambulatory Visit (INDEPENDENT_AMBULATORY_CARE_PROVIDER_SITE_OTHER): Payer: Medicare Other | Admitting: Family Medicine

## 2011-01-01 DIAGNOSIS — Z5181 Encounter for therapeutic drug level monitoring: Secondary | ICD-10-CM

## 2011-01-01 DIAGNOSIS — Z7901 Long term (current) use of anticoagulants: Secondary | ICD-10-CM

## 2011-01-01 DIAGNOSIS — I4891 Unspecified atrial fibrillation: Secondary | ICD-10-CM

## 2011-01-01 NOTE — Patient Instructions (Signed)
Pt has stable on current dose, so will continue it and recheck in 2 weeks, she will also increase greens.  a little.

## 2011-01-02 ENCOUNTER — Ambulatory Visit: Payer: PRIVATE HEALTH INSURANCE

## 2011-01-08 ENCOUNTER — Encounter: Payer: Self-pay | Admitting: Family Medicine

## 2011-01-08 ENCOUNTER — Other Ambulatory Visit: Payer: Self-pay | Admitting: Family Medicine

## 2011-01-08 NOTE — Telephone Encounter (Signed)
Patient notified and appointment was scheduled.

## 2011-01-08 NOTE — Telephone Encounter (Signed)
Needs to be seen

## 2011-01-08 NOTE — Telephone Encounter (Signed)
Pharmacy notified that refill has been denied and patient is aware.

## 2011-01-08 NOTE — Assessment & Plan Note (Signed)
[facility] OFFICE NOTE   Christine, Stone                     MRN:          [id number]  DATE:07/28/2008                            DOB:          Jun 08, 1926    REFERRING PHYSICIAN:  Arta Silence, MD   REASON FOR REFERRAL:  Dyspnea.   Christine Stone is an 77 year old woman with a history of hypertension,  diabetes, hyperlipidemia, and chronic atrial fibrillation.  Christine Stone was  previously followed by Dr. Myrtis Ser.   Christine Stone denies any history of known coronary disease.  Christine Stone had a Myoview  back in September 2004 which showed normal LV function and no evidence  of scar or ischemia.   Christine Stone notes that over the past 2 months Christine Stone has had progressive dyspnea to  the point where Christine Stone now gets totally exhausted and short of breath with  just getting up in the morning, showering, and putting on her clothes.  Christine Stone has not had any chest pain, no orthopnea, no PND, no lower extremity  edema, no recent cough, no fevers or chills.   Christine Stone saw Dr. Hetty Ely and her O2 sat decreased from 93% to 78% with  ambulation.  He put her on oxygen.  He also got a chest x-ray which  showed a stable small lung nodule.  There is no mention of significant  airspace disease.  There is no pulmonary edema.  Christine Stone is referred here  for further evaluation.   Review of systems is notable for fatigue, constipation, dyspnea,  arthritis, and thyroid disease.  Remainder of review of systems is  negative except for HPI and problem list.   PAST MEDICAL HISTORY:  1. Chronic atrial fibrillation.  2. Hypothyroidism.  3. Hypertension.  4. Hyperlipidemia.  5. Small lung nodule.  6. Diabetes.   CURRENT MEDICATIONS:  1. Levoxyl 88 mcg a day.  2. Verapamil 80 a day.  3. Glynase 3 mg b.i.d.  4. Coumadin.  5. Nasacort.  6. Diovan 80 a day.   ALLERGIES:  SULFA and NARCOTICS.   SOCIAL HISTORY:  Christine Stone is married.  Christine Stone is here with her husband.  Christine Stone is  retired.   Christine Stone previously lived at [facility] in their  lab.  Tobacco:  Christine Stone smoked half to three quarters of pack per day for 35  years, quit 25 years ago.  Does not drink alcohol.   FAMILY HISTORY:  There is a family history of premature coronary artery  disease.  Father died at age 87.   PHYSICAL EXAMINATION:  GENERAL:  Christine Stone is an elderly woman in no acute  distress.  Ambulates around the clinic without any significant  difficulty.  VITAL SIGNS:  Respirations are unlabored.  O2 sats were 93% at rest with  mild ambulation.  Christine Stone went down to 89%.  With 2 L of oxygen, Christine Stone stayed  at 96 throughout.  HEENT:  Normal.  NECK:  Supple.  No obvious JVD.  Carotids are 2+ bilaterally without any  bruits.  There is no lymphadenopathy or thyromegaly.  CARDIAC:  PMI is normal.  Christine Stone is irregular  with no obvious murmurs,  rubs, or gallops.  LUNGS:  Clear with mildly decreased air movement throughout.  No  wheezes.  No rales.  ABDOMEN:  Soft, nontender, nondistended.  There is no  hepatosplenomegaly.  No bruits.  No masses.  Good bowel sounds.  EXTREMITIES:  Warm with no cyanosis, clubbing, or edema.  There is good  capillary refill off oxygen.  No rash.  NEURO:  Alert and oriented x3.  Cranial nerves II through XII are  intact.  Moves all 4 extremities without difficulty.  Affect is  pleasant.   EKG shows chronic atrial fibrillation with ventricular rate of 66.  No  significant ST-T wave abnormalities.   ASSESSMENT AND PLAN:  Dyspnea with exertional hypoxemia.  Given her  hypoxemia, I suspect that this is primarily a lung disease, but could  also represent pulmonary hypertension.  At this point, we will go ahead  and get an echocardiogram to evaluate her cardiac function, both right  and left side, as well as to measure her pulmonary pressures and rule  out any pericardial effusion.  We will also refer her to Pulmonary for  full PFTs.  I stressed the need for oxygen supplementation with   activity.  We will see her back in a few weeks with the results of this  testing and further workup will be determined from there.  Obviously,  chronic thromboembolic disease could be a possibility, but Christine Stone has been  on Coumadin for some time.     Bevelyn Buckles. Bensimhon, MD  Electronically Signed    DRB/MedQ  DD: 07/28/2008  DT: 07/29/2008  Job #: [id number]

## 2011-01-08 NOTE — Assessment & Plan Note (Signed)
[facility] OFFICE NOTE   Christine Stone, Christine Stone                     MRN:          [id number]  DATE:08/31/2008                            DOB:          [birth year]    PRIMARY CARE PHYSICIAN:  Arta Silence, MD.   PULMONOLOGIST:  Barbaraann Share, MD, [facility]   INTERVAL HISTORY:  Christine Stone is a delightful 77 year old woman with a  history of hypertension, diabetes, hyperlipidemia, and chronic atrial  fibrillation.  There is no known history of coronary artery disease.  She had a Myoview back in September 2004, which showed normal LV  function.  No evidence of scar ischemia.   I saw her back in December for further evaluation of dyspnea.  She had  been recently started by Dr. Hetty Ely on oxygen for exertional  hypoxemia.  She was desaturating into the 70s.  We got an echocardiogram  which showed normal LV function and only minimally elevated right-sided  pressures.  I suspect that her main problem was pulmonary.  I referred  her to Dr. Shelle Iron.  She had PFTs, which showed moderate-to-severe  obstructive lung disease with an FEV-1 of 1 liters and severe reduction  in her diffusion capacity.  There was no response to bronchodilators.   She returns today for followup.  She continues to be dyspneic.  She  wears oxygen only if she needs it.  Today, sitting in her car.  She  denies any chest pain or orthopnea.  No PND.   CURRENT MEDICATIONS:  1. Synthroid 88 mcg a day.  2. Verapamil 80 daily.  3. Glynase  3 mg b.i.d.  4. Coumadin.  5. Nasacort oxygen as needed.  6. Diovan 80 mg a day.   PHYSICAL EXAMINATION:  GENERAL:  She is an elderly woman in no acute  distress, ambulates around the clinic without any respiratory  difficulty.  VITAL SIGNS:  Blood pressure is 112/58, heart rate 64, weight is 155.  HEENT:  Normal.  NECK:  Supple.  No JVD.  Carotids are 2+ bilaterally without any bruits.  There is no  lymphadenopathy or thyromegaly.  CARDIAC:  PMI is nondisplaced.  She has distant heart sounds.  She is  irregular with a very soft systolic ejection murmur at the right sternal  border.  LUNGS:  Clear with mildly decreased air movement throughout.  No  wheezes.  ABDOMEN:  Soft, nontender, nondistended.  No hepatosplenomegaly.  No  bruits.  No masses.  Good bowel sounds.  EXTREMITIES:  Warm with no  cyanosis, clubbing, or edema.  No rash.  NEURO:  Alert and oriented x3.  Cranial nerves II through XII are  intact.  Moves all 4 extremities without difficulty.  Affect is  pleasant.   Echocardiogram shows an EF of 60% with very mild aortic stenosis with a  mean gradient of 10 mmHg.  Estimated peak pulmonary pressure was 33.   ASSESSMENT AND PLAN:  1. Atrial fibrillation.  This is chronic.  She is well rate      controlled.  Continue Coumadin.  2. Dyspnea.  This  is secondary to her significant emphysema.  I had a      long talk with her and her husband about the need to use her oxygen      to prevent hypoxemia and avoid development of secondary pulmonary      hypertension.  3. Hypertension.  This is well controlled.   DISPOSITION:  We will see her back in 6 months for routine followup.     Bevelyn Buckles. Bensimhon, MD  Electronically Signed    DRB/MedQ  DD: 08/31/2008  DT: 09/01/2008  Job #: [id number]

## 2011-01-08 NOTE — Telephone Encounter (Signed)
Received refill request from pharmacy. Called patient and was advised that she had a UTI and was suppose to come back and have her urine checked after finishing her medication. Patient stated that she tried to call the office and never could talk with a person to get an appointment scheduled. Patient stated that she thinks that she has another UTI, patient complains of pressure on her bladder, some burning and she has noticed a pinkish color on the toilet paper when she wipes. Offered patient an appointment tomorrow and she said that it would be hard for her to come in Wednesday.

## 2011-01-09 ENCOUNTER — Encounter: Payer: Self-pay | Admitting: Family Medicine

## 2011-01-09 ENCOUNTER — Ambulatory Visit (INDEPENDENT_AMBULATORY_CARE_PROVIDER_SITE_OTHER): Payer: Medicare Other | Admitting: Family Medicine

## 2011-01-09 VITALS — BP 138/70 | HR 72 | Temp 98.0°F | Ht 64.0 in | Wt 152.0 lb

## 2011-01-09 DIAGNOSIS — N390 Urinary tract infection, site not specified: Secondary | ICD-10-CM

## 2011-01-09 LAB — POCT URINALYSIS DIPSTICK
Bilirubin, UA: NEGATIVE
Glucose, UA: NEGATIVE
Ketones, UA: NEGATIVE
Nitrite, UA: NEGATIVE

## 2011-01-09 MED ORDER — PHENAZOPYRIDINE HCL 100 MG PO TABS: 100.0000 mg | ORAL_TABLET | Freq: Three times a day (TID) | ORAL | Status: DC | PRN
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
## 2011-01-09 NOTE — Patient Instructions (Signed)
Start Pyridium. Await culture results.

## 2011-01-09 NOTE — Assessment & Plan Note (Signed)
Will await culture results before treating. Use Pyridium tid to help with sxs.

## 2011-01-11 LAB — URINE CULTURE: Colony Count: 60000

## 2011-01-11 MED ORDER — AMPICILLIN 500 MG PO CAPS: 500.0000 mg | ORAL_CAPSULE | Freq: Four times a day (QID) | ORAL | Status: DC

## 2011-01-11 NOTE — Discharge Summary (Signed)
NAME:  Christine Stone, Christine Stone                        ACCOUNT NO.:  [id number]   MEDICAL RECORD NO.:  [id number]                   PATIENT TYPE:  INP   LOCATION:  2010                                 FACILITY:  [facility]   PHYSICIAN:  Rene Paci, M.D. [facility]          DATE OF BIRTH:  1925/09/08   DATE OF ADMISSION:  08/29/2003  DATE OF DISCHARGE:  09/02/2003                                 DISCHARGE SUMMARY   DISCHARGE DIAGNOSES:  1. Acute asthmatic exacerbation, question viral bronchitis, improved on     steroid taper plus albuterol metered-dose inhaler.  2. Type 2 diabetes exacerbated by steroids.  Hemoglobin A1C normal at 6.4.  3. History of atrial fibrillation, rate controlled, on chronic Coumadin with     discharge INR 2.4.  4. History of hypothyroidism, normal thyroid-stimulating hormone.  Continue     home dose of Synthroid.   DISCHARGE MEDICATIONS:  1. Prednisone taper 20 mg 3 pills p.o. daily x 3 days, then 2 pills p.o.     daily x 3 days, then 1 pill p.o. daily x 3 days, then stop.  2. Augmentin 500 mg p.o. b.i.d. x 6 additional days to complete a 10-day     course.  3. Albuterol inhaler with spacer 2 puffs four times a day x 1 week, then     q.4h. p.r.n. shortness of breath.  Other medications are as prior to admission and include:  1. Verapamil 120 mg p.o. daily.  2. Coumadin 2 mg p.o. daily.  3. Premarin 0.625 mg p.o. daily.  4. Glyburide 3 mg p.o. daily.  5. Diovan 80 mg p.o. daily.  6. Levoxyl 88 mcg p.o. daily.  7. Lasix 20 mg p.o. daily.  8. Lanoxin 0.25 mg p.o. daily.  9. Caltrate 600 mg p.o. b.i.d.   DISPOSITION:  The patient is discharged to home medically stable in improved  condition.  She is tolerating p.o. as well as room air saturation 94 to 96%.  She has been instructed on use of albuterol inhaler with spacer prior to  discharge.  Disposition home.   HOSPITAL COURSE:  #1.  ACUTE ASTHMA EXACERBATION:  The patient is a pleasant 77 year old woman  with no  previous history of asthma or tobacco abuse who presented to the  emergency room complaining of being unable to breath.  She had been seen in  her primary care physician's office the same day and had been diagnosed with  a type of pneumonia and instructed that if her shortness of breath continued  to worsen, she should seek evaluation in the emergency room.  Once home, the  patient did feel that she was unable to catch her breath and thus came to  [facility] ER for further evaluation.  She was found to be tight with moderate air  movement and wheezing and thus referred for admission.  Chest x-ray was  clear without significant evidence of pneumonia but clinically by symptoms,  the  patient did have a type of bronchitis and was thus begun on IV  antibiotics.  She was treated with azithromycin and Rocephin for 3 days and  then changed to oral Augmentin which she has tolerated without difficulty.  She was treated with IV Solu-Medrol for the first 72 hours and then changed  to oral prednisone the day prior to discharge.  She has tolerated this well  and feels 200% improved since her admission.  She is ambulating around the  room without oxygen, maintaining her O2 saturation without significant  dyspnea.  She has been instructed on use of albuterol inhaler prior to  discharge, instructions as above.  She is reminded to return to the  emergency room if her shortness of breath worsens once home or if she has  fever.  Otherwise follow up with primary care physician in the next two to  three weeks or as scheduled.   #2.  TYPE 2 DIABETES:  The patient's glycemic control was exacerbated by use  of Solu-Medrol during this hospitalization and was covered with additional  sliding scale insulin.  Her hemoglobin A1C  at 6.4 indicated good outpatient  control, so no other medical changes were recommended.  At time of  discharge, she is only to resume her home medications without any further  insulin use as it is  anticipated that once off prednisone, her CBGs will  return to their previously well-controlled state.  High CBGs during  hospitalization still less than 200.   #3.  OTHER MEDICAL PROBLEMS.:  The patient's other medical problems  including atrial fibrillation and hypothyroidism remained well controlled  during this hospitalization.  No other changes were made.  Laboratory data  as above.                                                Rene Paci, M.D. [facility]    VL/MEDQ  D:  09/02/2003  T:  09/03/2003  Job:  [id number]

## 2011-01-11 NOTE — Discharge Summary (Signed)
   NAME:  Christine Stone, Christine Stone                        ACCOUNT NO.:  [id number]   MEDICAL RECORD NO.:  [id number]                   PATIENT TYPE:  INP   LOCATION:  2014                                 FACILITY:  [facility]   PHYSICIAN:  Willa Rough, M.D.                  DATE OF BIRTH:  1926-07-24   DATE OF ADMISSION:  05/05/2003  DATE OF DISCHARGE:  05/06/2003                           DISCHARGE SUMMARY - REFERRING   DISCHARGE DIAGNOSES:  1. Chest pain, resolved.  2. Fatigue.  3. Hypothyroidism, treated.  4. Chronic atrial fibrillation.  5. Long-term Coumadin use.  6. Hypertension.   HOSPITAL COURSE:  [name] is a 77 year old female with no known history  of coronary artery disease, but has a history of chronic atrial fibrillation  and long-term Coumadin use.  Two days ago prior to this admission, she  experienced substernal chest pain that eventually resolved on its own.  Since that time, she has felt tired and fatigued.  During the night, she has  become progressively dyspneic, and has proceeded to the ER.  During her  hospitalization, laboratory studies revealed a TSH of 4.772, cardiac  isoenzymes that were negative.  Digoxin level of 0.5.  Sodium 137, potassium  4.1, BUN 18, creatinine 1.1, hemoglobin 15, hematocrit 44.0.  She then  underwent an adenosine Cardiolite that revealed no stress-induced ischemia.  A 2-D echo revealed a normal ejection fraction with normal valves.  Of note,  the Cardiolite reveals no ________ischemia; it was not gated secondary to  atrial fibrillation.  She was noted to be somewhat bradycardic on telemetry  with pulse rates in the 60s, and a 2.2 second pause.  At this point, her  digoxin was stopped, and her verapamil was cut in half.   DISCHARGE MEDICATIONS:  1. Coumadin as prior to admission.  2. Glyburide 3 mg daily.  3. Verapamil 120 mg daily.  4. Diovan 80 mg daily.  5. Digitek is stopped.  6 . Levoxyl 8 mcg, continue one daily.  1. Premarin  0.625 mg daily.  2. Lasix 20 mg one p.o. daily.  3. Tylenol 1-2 tablets q.6h. as needed for pain.   ACTIVITY:  No strenuous activity for two days and gradually increase  activity.   DIET:  Remain on a low-fat diabetic diet.    DISCHARGE INSTRUCTIONS:  Call for any questions or concerns, and office will  call with a follow up appointment with Dr. Myrtis Ser.      Guy Franco, P.A. [facility]                      Willa Rough, M.D.    LB/MEDQ  D:  05/25/2003  T:  05/25/2003  Job:  [id number]   cc:   Laurita Quint, M.D.  [address] [place]  [place] [address]  Fax: [phone]

## 2011-01-11 NOTE — Progress Notes (Signed)
Addended by: Laurita Quint on: 01/11/2011 06:27 PM   Modules accepted: Orders

## 2011-01-14 ENCOUNTER — Telehealth: Payer: Self-pay | Admitting: *Deleted

## 2011-01-14 DIAGNOSIS — N390 Urinary tract infection, site not specified: Secondary | ICD-10-CM

## 2011-01-14 MED ORDER — AMPICILLIN 500 MG PO CAPS: 500.0000 mg | ORAL_CAPSULE | Freq: Four times a day (QID) | ORAL | Status: DC

## 2011-01-14 NOTE — Telephone Encounter (Signed)
Rx sent based on Dr. Lorenza Chick notes.

## 2011-01-14 NOTE — Telephone Encounter (Signed)
Dr. Hetty Ely sent a script for ampicillin to [facility] on 5/16.  This has 2 sets of directions and pharmacist is asking for clarification.  Please advise.

## 2011-01-15 ENCOUNTER — Ambulatory Visit (INDEPENDENT_AMBULATORY_CARE_PROVIDER_SITE_OTHER): Payer: PRIVATE HEALTH INSURANCE | Admitting: Family Medicine

## 2011-01-15 DIAGNOSIS — I4891 Unspecified atrial fibrillation: Secondary | ICD-10-CM

## 2011-01-15 DIAGNOSIS — Z7901 Long term (current) use of anticoagulants: Secondary | ICD-10-CM

## 2011-01-15 DIAGNOSIS — Z5181 Encounter for therapeutic drug level monitoring: Secondary | ICD-10-CM

## 2011-01-15 LAB — POCT INR: INR: 3.3

## 2011-01-15 NOTE — Progress Notes (Signed)
Will return for INR on 01/18/11 since she is on abx.  2.5mg  of coumadin per day in meantime.

## 2011-01-17 ENCOUNTER — Ambulatory Visit (INDEPENDENT_AMBULATORY_CARE_PROVIDER_SITE_OTHER): Payer: Medicare Other | Admitting: Family Medicine

## 2011-01-17 DIAGNOSIS — Z7901 Long term (current) use of anticoagulants: Secondary | ICD-10-CM

## 2011-01-17 DIAGNOSIS — I4891 Unspecified atrial fibrillation: Secondary | ICD-10-CM

## 2011-01-17 DIAGNOSIS — Z5181 Encounter for therapeutic drug level monitoring: Secondary | ICD-10-CM

## 2011-01-17 LAB — POCT INR: INR: 2.6

## 2011-01-18 ENCOUNTER — Ambulatory Visit: Payer: PRIVATE HEALTH INSURANCE

## 2011-01-30 ENCOUNTER — Ambulatory Visit (INDEPENDENT_AMBULATORY_CARE_PROVIDER_SITE_OTHER): Payer: Medicare Other | Admitting: Family Medicine

## 2011-01-30 DIAGNOSIS — I4891 Unspecified atrial fibrillation: Secondary | ICD-10-CM

## 2011-01-30 DIAGNOSIS — Z7901 Long term (current) use of anticoagulants: Secondary | ICD-10-CM

## 2011-01-30 DIAGNOSIS — N390 Urinary tract infection, site not specified: Secondary | ICD-10-CM

## 2011-01-30 DIAGNOSIS — Z5181 Encounter for therapeutic drug level monitoring: Secondary | ICD-10-CM

## 2011-01-30 LAB — POCT URINALYSIS DIPSTICK
Ketones, UA: NEGATIVE
Spec Grav, UA: 1.025
Urobilinogen, UA: 0.2

## 2011-01-30 NOTE — Progress Notes (Signed)
Addended by: Sydell Axon C on: 01/30/2011 12:39 PM   Modules accepted: Orders

## 2011-01-30 NOTE — Progress Notes (Signed)
Addended by: Alvina Chou on: 01/30/2011 12:43 PM   Modules accepted: Orders

## 2011-01-30 NOTE — Patient Instructions (Signed)
Patient will start her regular dose of Coumadin 2.5 mg daily, 5 mg Mon,Fri. She will finish her antibiotics  in 2-3 days. Check in 1 month

## 2011-02-01 LAB — URINE CULTURE: Organism ID, Bacteria: NO GROWTH

## 2011-02-04 ENCOUNTER — Other Ambulatory Visit: Payer: Self-pay | Admitting: *Deleted

## 2011-02-04 ENCOUNTER — Encounter: Payer: Self-pay | Admitting: Family Medicine

## 2011-02-04 ENCOUNTER — Ambulatory Visit (INDEPENDENT_AMBULATORY_CARE_PROVIDER_SITE_OTHER): Payer: Medicare Other | Admitting: Family Medicine

## 2011-02-04 VITALS — BP 142/68 | HR 92 | Temp 97.9°F | Wt 152.5 lb

## 2011-02-04 DIAGNOSIS — R5383 Other fatigue: Secondary | ICD-10-CM

## 2011-02-04 DIAGNOSIS — R42 Dizziness and giddiness: Secondary | ICD-10-CM

## 2011-02-04 DIAGNOSIS — R350 Frequency of micturition: Secondary | ICD-10-CM

## 2011-02-04 DIAGNOSIS — Z136 Encounter for screening for cardiovascular disorders: Secondary | ICD-10-CM

## 2011-02-04 DIAGNOSIS — E119 Type 2 diabetes mellitus without complications: Secondary | ICD-10-CM

## 2011-02-04 DIAGNOSIS — R5381 Other malaise: Secondary | ICD-10-CM

## 2011-02-04 LAB — POCT URINALYSIS DIPSTICK
Bilirubin, UA: NEGATIVE
Glucose, UA: NEGATIVE
Nitrite, UA: NEGATIVE
Spec Grav, UA: 1.025
Urobilinogen, UA: 0.2

## 2011-02-04 NOTE — Assessment & Plan Note (Signed)
Random sugar finger stick 3 hrs after eating chicken sandwich on whole wheat 63.  Stop Glyburide.

## 2011-02-04 NOTE — Progress Notes (Signed)
  Subjective:    Patient ID: Christine Stone, female    DOB: September 09, 1925, 77 y.o.   MRN: [id number]  HPI Pt here after giving urine sample for test of cure after prolonged UTI. She was complaining of "Not feeling right" and my nurse had me see her. She stopped her Ab the middle of last week. She is complaining of being dizzy and can't do much of anything anymore. Fri nite she felt like she was going to pass out. Orange juice eventually had her feel better. Her sugar was 58. She is on Glyburide. She has been low a couple of times. One night during the night she woke up and thought she had indigestion from a heavy feeling in her chest. Since then she has not felt like being up.  She has many of the sxs from the side effect profile of Losartan generic.  She is not an alarmist and does not complain but she really feels bad. "If this doesn't get any better, I would just as soon pass on." She does not look this bad.    Review of Systems  Constitutional: Positive for activity change (doesn't feel like doing anything.) and fatigue.  HENT: Positive for hearing loss (chronic). Negative for ear pain and congestion.   Respiratory: Negative for choking, chest tightness and shortness of breath.   Cardiovascular: Negative for chest pain and palpitations.  Neurological: Positive for dizziness and weakness.       Objective:   Physical Exam  Constitutional: She appears well-developed and well-nourished. No distress.  HENT:  Head: Normocephalic and atraumatic.  Right Ear: External ear normal.  Left Ear: External ear normal.  Nose: Nose normal.  Mouth/Throat: Oropharynx is clear and moist. No oropharyngeal exudate.  Eyes: Conjunctivae and EOM are normal. Pupils are equal, round, and reactive to light.  Neck: Normal range of motion. Neck supple. No thyromegaly present.  Cardiovascular: Normal rate, regular rhythm, normal heart sounds and intact distal pulses.   No murmur heard.      Mildly irreg HR, no  edema, no SOB while sitting.  Pulmonary/Chest: Effort normal and breath sounds normal. She has no wheezes. She has no rales.  Lymphadenopathy:    She has no cervical adenopathy.  Skin: She is not diaphoretic.  EKG Afib rate 72.        Assessment & Plan:

## 2011-02-04 NOTE — Assessment & Plan Note (Signed)
With Fatigue. Presume this to be hypoglycemia with some effect of presumed recurrent UTI and poss small contribution from being back in recurrent Afib, altho rate well controlled at 72. Stop Sulfonylurea (Glyburide), await culture report Wed or Thu for whether infection and what to use to treat it and keep an eye on rate control for the Afib. Is on Warfarin, so risk low there.

## 2011-02-04 NOTE — Patient Instructions (Addendum)
RTC 4 weeeks Stop Glyburide. Restart at 1/2 dose in one week if feeling better. Stop if sxs return. Will get a cal Wed or Thu about poss Urinary Infection and treatment required. Currently in Afib but rate good so probably not terribly relevant.  spent with pt.

## 2011-02-04 NOTE — Assessment & Plan Note (Signed)
See above

## 2011-02-05 NOTE — Telephone Encounter (Signed)
Opened in error

## 2011-02-07 ENCOUNTER — Telehealth: Payer: Self-pay | Admitting: *Deleted

## 2011-02-07 DIAGNOSIS — N390 Urinary tract infection, site not specified: Secondary | ICD-10-CM

## 2011-02-07 LAB — URINE CULTURE: Colony Count: >=100000

## 2011-02-07 MED ORDER — CIPROFLOXACIN HCL 250 MG PO TABS: 250.0000 mg | ORAL_TABLET | Freq: Two times a day (BID) | ORAL | Status: AC

## 2011-02-07 NOTE — Telephone Encounter (Signed)
Please call pt.  Ucx just came back this AM.  Has KLEBSIELLA PNEUMONIAE sens to cipro.  Start cipro twice a day.  She'll need to take 2.5mg  of coumadin today and Saturday, but no coumadin on Friday and Sunday.  Needs to come in for INR check on Monday.  I put in uro referral.  Thanks.

## 2011-02-07 NOTE — Telephone Encounter (Signed)
Patient notified as instructed by telephone. Patient to come in Monday for INR check.

## 2011-02-07 NOTE — Telephone Encounter (Signed)
Pt is asking that someone review her urine culture results and advise her.  She uses [facility] [address] if a script is needed and she says she cant take augmentin.

## 2011-02-11 ENCOUNTER — Ambulatory Visit: Payer: Medicare Other

## 2011-03-06 ENCOUNTER — Ambulatory Visit (INDEPENDENT_AMBULATORY_CARE_PROVIDER_SITE_OTHER): Payer: Medicare Other | Admitting: Family Medicine

## 2011-03-06 DIAGNOSIS — I4891 Unspecified atrial fibrillation: Secondary | ICD-10-CM

## 2011-03-06 DIAGNOSIS — Z7901 Long term (current) use of anticoagulants: Secondary | ICD-10-CM

## 2011-03-06 DIAGNOSIS — Z5181 Encounter for therapeutic drug level monitoring: Secondary | ICD-10-CM

## 2011-03-06 NOTE — Patient Instructions (Signed)
Continue 2.5 mg daily, 5 mg Mon,Fri

## 2011-03-07 ENCOUNTER — Encounter: Payer: Self-pay | Admitting: Family Medicine

## 2011-03-07 ENCOUNTER — Ambulatory Visit (INDEPENDENT_AMBULATORY_CARE_PROVIDER_SITE_OTHER): Payer: Medicare Other | Admitting: Family Medicine

## 2011-03-07 DIAGNOSIS — Z87440 Personal history of urinary (tract) infections: Secondary | ICD-10-CM

## 2011-03-07 DIAGNOSIS — E119 Type 2 diabetes mellitus without complications: Secondary | ICD-10-CM

## 2011-03-07 DIAGNOSIS — N390 Urinary tract infection, site not specified: Secondary | ICD-10-CM

## 2011-03-07 DIAGNOSIS — R300 Dysuria: Secondary | ICD-10-CM

## 2011-03-07 DIAGNOSIS — R5381 Other malaise: Secondary | ICD-10-CM

## 2011-03-07 LAB — POCT URINALYSIS DIPSTICK
Bilirubin, UA: NEGATIVE
Glucose, UA: NEGATIVE
Ketones, UA: NEGATIVE
Nitrite, UA: NEGATIVE
pH, UA: 5

## 2011-03-07 NOTE — Assessment & Plan Note (Signed)
Appears improved off Glyburide. Will cont to follow.

## 2011-03-07 NOTE — Assessment & Plan Note (Signed)
Apppears resolved at present. WIll send culture to confirm. U/A micro 0-1 WBCs 0 RBCs Rare epis

## 2011-03-07 NOTE — Patient Instructions (Signed)
RTC 2 weeks, bring Sugar Diary.

## 2011-03-07 NOTE — Assessment & Plan Note (Signed)
Cont to hold Glyburide and make diary of glucose monitoring in 4 day progression as shown.

## 2011-03-07 NOTE — Progress Notes (Signed)
  Subjective:    Patient ID: Christine Stone, female    DOB: 11/11/1925, 77 y.o.   MRN: [id number]  HPI Pt here for followup of fatigue and malaise with UTI from last time. She was treated with Ab which she has now been off for about two weeks. She has felt better. We also stopped her Sulfonylurea (Glyburide) for two weeks and then restarted it at half the dose. She felt good after stopping the medication and feels "OK" since starting on the half dose, but felt better on none. She saw no elevation in her sugar with the stopping and then the restarting, indeed, she thinks her nos are lower off the medication. She has no real complaints today, no burning or pain with urination. She has again been off Abs about two weeks.    Review of Systems  Constitutional: Negative for fever, chills, diaphoresis, activity change and fatigue.  HENT: Negative for ear pain, congestion, rhinorrhea and postnasal drip.   Eyes: Negative for redness.  Respiratory: Negative for cough, chest tightness, shortness of breath and wheezing.   Cardiovascular: Negative for chest pain.       Objective:   Physical Exam  Constitutional: She appears well-developed and well-nourished. No distress.  HENT:  Head: Normocephalic and atraumatic.  Right Ear: External ear normal.  Left Ear: External ear normal.  Nose: Nose normal.  Mouth/Throat: Oropharynx is clear and moist. No oropharyngeal exudate.  Eyes: Conjunctivae and EOM are normal. Pupils are equal, round, and reactive to light.  Neck: Normal range of motion. Neck supple. No thyromegaly present.  Cardiovascular: Normal rate, regular rhythm and normal heart sounds.   Pulmonary/Chest: Effort normal and breath sounds normal. She has no wheezes. She has no rales.  Abdominal: Soft. Bowel sounds are normal.       No suprapubic pain to palpation.  Musculoskeletal:       No CVAT.  Lymphadenopathy:    She has no cervical adenopathy.  Skin: She is not diaphoretic.           Assessment & Plan:

## 2011-03-09 LAB — URINE CULTURE: Colony Count: 25000

## 2011-03-11 ENCOUNTER — Telehealth: Payer: Self-pay | Admitting: *Deleted

## 2011-03-11 NOTE — Telephone Encounter (Signed)
Patient says that she received a message asking that she you call you. She says that she can be reached at home number.

## 2011-03-12 NOTE — Telephone Encounter (Signed)
This call for Christine Stone to discuss past U/A

## 2011-03-12 NOTE — Telephone Encounter (Signed)
Patient notified as instructed by telephone of recent urine test results.

## 2011-03-20 ENCOUNTER — Encounter: Payer: Self-pay | Admitting: Family Medicine

## 2011-03-20 ENCOUNTER — Ambulatory Visit (INDEPENDENT_AMBULATORY_CARE_PROVIDER_SITE_OTHER): Payer: Medicare Other | Admitting: Family Medicine

## 2011-03-20 DIAGNOSIS — E119 Type 2 diabetes mellitus without complications: Secondary | ICD-10-CM

## 2011-03-20 DIAGNOSIS — R5383 Other fatigue: Secondary | ICD-10-CM

## 2011-03-20 DIAGNOSIS — I10 Essential (primary) hypertension: Secondary | ICD-10-CM

## 2011-03-20 MED ORDER — LOSARTAN POTASSIUM 25 MG PO TABS: 25.0000 mg | ORAL_TABLET | Freq: Every day | ORAL | Status: DC

## 2011-03-20 NOTE — Assessment & Plan Note (Signed)
See if adjusting BP meds makes a difference.

## 2011-03-20 NOTE — Progress Notes (Signed)
  Subjective:    Patient ID: Christine Stone, female    DOB: [birth year], 77 y.o.   MRN: [id number]  HPI Pt here for two week followuip after stopping sulfonylurea for her diabetes because she felt so bad I felt she might be getting low. She has felt better off the medication. She has checked her nos in a four day progression. She had felt better after stopping the meds but now is lightheaded most of the time, even getting out of bed.    Review of Systems  Constitutional: Negative for fever, chills, diaphoresis, activity change and fatigue.  HENT: Negative for ear pain, congestion, rhinorrhea and postnasal drip.   Eyes: Negative for redness.  Respiratory: Negative for cough, chest tightness, shortness of breath and wheezing.   Cardiovascular: Negative for chest pain.       Objective:   Physical Exam  Constitutional: She appears well-developed and well-nourished. No distress.  HENT:  Head: Normocephalic and atraumatic.  Right Ear: External ear normal.  Left Ear: External ear normal.  Nose: Nose normal.  Mouth/Throat: Oropharynx is clear and moist. No oropharyngeal exudate.  Eyes: Conjunctivae and EOM are normal. Pupils are equal, round, and reactive to light.  Neck: Normal range of motion. Neck supple. No thyromegaly present.  Cardiovascular: Normal rate, regular rhythm and normal heart sounds.   Pulmonary/Chest: Effort normal and breath sounds normal. She has no wheezes. She has no rales.  Lymphadenopathy:    She has no cervical adenopathy.  Skin: She is not diaphoretic.          Assessment & Plan:

## 2011-03-20 NOTE — Patient Instructions (Signed)
RTC one month for recheck. Decrease Cozaar to 25mg .

## 2011-03-20 NOTE — Assessment & Plan Note (Signed)
Nos appear ok off meds. 4 day progression nos typically 90-100s. Once was 143, twice in 120s.  Cont off meds.

## 2011-03-20 NOTE — Assessment & Plan Note (Signed)
Well controlled. Will decrease Cozaar and see if that helps how she feels.  Recheck in one month. BP Readings from Last 3 Encounters:  03/20/11 112/72  03/07/11 120/70  02/04/11 142/68

## 2011-03-28 ENCOUNTER — Ambulatory Visit: Payer: Self-pay | Admitting: Urology

## 2011-04-03 ENCOUNTER — Ambulatory Visit (INDEPENDENT_AMBULATORY_CARE_PROVIDER_SITE_OTHER): Payer: Medicare Other | Admitting: Family Medicine

## 2011-04-03 DIAGNOSIS — Z5181 Encounter for therapeutic drug level monitoring: Secondary | ICD-10-CM

## 2011-04-03 DIAGNOSIS — Z7901 Long term (current) use of anticoagulants: Secondary | ICD-10-CM

## 2011-04-03 DIAGNOSIS — I4891 Unspecified atrial fibrillation: Secondary | ICD-10-CM

## 2011-04-03 NOTE — Patient Instructions (Signed)
Continue current dose, check in 4 weeks  

## 2011-04-24 ENCOUNTER — Encounter: Payer: Self-pay | Admitting: Family Medicine

## 2011-04-24 ENCOUNTER — Ambulatory Visit (INDEPENDENT_AMBULATORY_CARE_PROVIDER_SITE_OTHER): Payer: Medicare Other | Admitting: Family Medicine

## 2011-04-24 DIAGNOSIS — E119 Type 2 diabetes mellitus without complications: Secondary | ICD-10-CM

## 2011-04-24 DIAGNOSIS — R5383 Other fatigue: Secondary | ICD-10-CM

## 2011-04-24 DIAGNOSIS — R5381 Other malaise: Secondary | ICD-10-CM

## 2011-04-24 DIAGNOSIS — I10 Essential (primary) hypertension: Secondary | ICD-10-CM

## 2011-04-24 MED ORDER — VERAPAMIL HCL 40 MG PO TABS: ORAL_TABLET | ORAL | Status: DC

## 2011-04-24 NOTE — Assessment & Plan Note (Signed)
FIne with decreasing her Cozaar. Stay on lower dose. Due to increased heartrate however, will increase her Verapamil to 11/2 daily instead of 1 and see if slowing heartrate to decrease Afib incidence and give longer filling time will help malaise and fatigue. Will reassess in one month. BP Readings from Last 3 Encounters:  04/24/11 124/64  03/20/11 112/72  03/07/11 120/70

## 2011-04-24 NOTE — Progress Notes (Signed)
  Subjective:    Patient ID: Christine Stone, female    DOB: October 01, 1925, 77 y.o.   MRN: [id number]  HPI Pt here for one month recheck after having stopped DM meds with no decrease in her sugar control from previous and lately having decreased Cozaar from 50 to 25 due to ongouing malaise and fatigue.  Her sugar continues to run as normal, approx 110-120, twice as high as 140 in one month.  She thinks she felt better initially with the changes and then now is having acute symptoms when she feels her heart do funny things. She has known Afib and thinks it could be when this acts up. She does feel better overall than she did previously before we started changing/stopping/decreasing meds.    Review of SystemsNoncontributory except as above.       Objective:   Physical Exam  Constitutional: She appears well-developed and well-nourished. No distress.  HENT:  Head: Normocephalic and atraumatic.  Right Ear: External ear normal.  Left Ear: External ear normal.  Nose: Nose normal.  Mouth/Throat: Oropharynx is clear and moist. No oropharyngeal exudate.  Eyes: Conjunctivae and EOM are normal. Pupils are equal, round, and reactive to light.  Neck: Normal range of motion. Neck supple. No thyromegaly present.  Cardiovascular: Normal heart sounds.        Heartrate elevated today. Irregular when auscultated.  Pulmonary/Chest: Effort normal and breath sounds normal. She has no wheezes. She has no rales.  Lymphadenopathy:    She has no cervical adenopathy.  Skin: She is not diaphoretic.          Assessment & Plan:

## 2011-04-24 NOTE — Patient Instructions (Addendum)
RTC one month.  Increase Verapamil to 11/2 tabs each day to decrease heartrate and increase filling times, hopefully make you feel better.

## 2011-04-24 NOTE — Assessment & Plan Note (Signed)
Seems slightly improved with recent medication changes. Continue as prescribed.

## 2011-04-24 NOTE — Assessment & Plan Note (Signed)
Forgot her diary but nos she remembered sound adequate. Continue off DM meds.

## 2011-04-30 ENCOUNTER — Other Ambulatory Visit: Payer: Self-pay | Admitting: *Deleted

## 2011-04-30 MED ORDER — VERAPAMIL HCL 40 MG PO TABS: ORAL_TABLET | ORAL | Status: DC

## 2011-04-30 NOTE — Telephone Encounter (Signed)
Received faxed refill request from pharmacy for Verapamil 40mg  take one by mouth in the morning. Pharmacy was informed by patient that there should be new directions for this. Patient's med sheet shows take 1 1/2 daily. Please verify dispense amount and number of refills.

## 2011-05-01 ENCOUNTER — Ambulatory Visit (INDEPENDENT_AMBULATORY_CARE_PROVIDER_SITE_OTHER): Payer: Medicare Other | Admitting: Family Medicine

## 2011-05-01 DIAGNOSIS — Z5181 Encounter for therapeutic drug level monitoring: Secondary | ICD-10-CM

## 2011-05-01 DIAGNOSIS — I4891 Unspecified atrial fibrillation: Secondary | ICD-10-CM

## 2011-05-01 DIAGNOSIS — Z7901 Long term (current) use of anticoagulants: Secondary | ICD-10-CM

## 2011-05-01 LAB — POCT INR: INR: 3.8

## 2011-05-01 NOTE — Telephone Encounter (Signed)
Rx called to pharmacy as instructed. 

## 2011-05-01 NOTE — Patient Instructions (Signed)
2.5 mg daily, 5 mg Mon (reduced dose today by 2.5 mg)check in 2 weeks

## 2011-05-23 ENCOUNTER — Encounter: Payer: Self-pay | Admitting: Family Medicine

## 2011-05-23 ENCOUNTER — Ambulatory Visit: Payer: Medicare Other

## 2011-05-23 ENCOUNTER — Ambulatory Visit (INDEPENDENT_AMBULATORY_CARE_PROVIDER_SITE_OTHER): Payer: Medicare Other | Admitting: Family Medicine

## 2011-05-23 DIAGNOSIS — E119 Type 2 diabetes mellitus without complications: Secondary | ICD-10-CM

## 2011-05-23 DIAGNOSIS — R42 Dizziness and giddiness: Secondary | ICD-10-CM

## 2011-05-23 DIAGNOSIS — I4891 Unspecified atrial fibrillation: Secondary | ICD-10-CM

## 2011-05-23 DIAGNOSIS — I10 Essential (primary) hypertension: Secondary | ICD-10-CM

## 2011-05-23 DIAGNOSIS — Z23 Encounter for immunization: Secondary | ICD-10-CM

## 2011-05-23 DIAGNOSIS — R5381 Other malaise: Secondary | ICD-10-CM

## 2011-05-23 NOTE — Progress Notes (Signed)
Addended by: Sydell Axon C on: 05/23/2011 03:58 PM   Modules accepted: Orders

## 2011-05-23 NOTE — Progress Notes (Signed)
  Subjective:    Patient ID: Christine Stone, female    DOB: November 26, 1925, 77 y.o.   MRN: [id number]  HPI Pt here for one month followup. We took her off her diabetic meds and decreased her BP meds to help with fatigue and malaise. Her sugar remained stable and her BP was acceptable but last visit her HR was elevated so increased her Verapamil to better control her Afib and hopefully increase filling time to help fatigue. She is tolerating the increase well without any complaints. She never feels "great" anymore but continues to feel well enough to keep going doing her usual routine and working in the yard, which gives her significant satisfaction. She has pain in the soles of her feet routinely if she is up on her feet for any length of time. She wears orthotics which tend to help with the discomfort. She wears expensive supportive shoes in the garden but this does not preclude her still having pain. Last night her discomfort woke her up approx 3AM after taking two Tyl at bedtime.    Review of SystemsNoncontributory except as above.       Objective:   Physical Exam  Constitutional: She appears well-developed and well-nourished. No distress.  HENT:  Head: Normocephalic and atraumatic.  Right Ear: External ear normal.  Left Ear: External ear normal.  Nose: Nose normal.  Mouth/Throat: Oropharynx is clear and moist. No oropharyngeal exudate.  Eyes: Conjunctivae and EOM are normal. Pupils are equal, round, and reactive to light.  Neck: Normal range of motion. Neck supple. No thyromegaly present.  Cardiovascular: Normal rate, regular rhythm and normal heart sounds.   Pulmonary/Chest: Effort normal and breath sounds normal. She has no wheezes. She has no rales.  Lymphadenopathy:    She has no cervical adenopathy.  Skin: She is not diaphoretic.          Assessment & Plan:

## 2011-05-23 NOTE — Assessment & Plan Note (Signed)
Sounds to be still reasonably controlled. Cont curr treatment and activity level.

## 2011-05-23 NOTE — Assessment & Plan Note (Signed)
Improved. She does not complain of this much anymore. Seems better since adjusting medications.

## 2011-05-23 NOTE — Assessment & Plan Note (Signed)
Improved with recent adjustment of medications. No significant change but she is able to do pretty much what she wants to without difficulty.

## 2011-05-23 NOTE — Patient Instructions (Signed)
Continue current therapy. Return if symptoms worsen. RTC for regular eval next year.

## 2011-05-23 NOTE — Assessment & Plan Note (Signed)
Well controlled. Decreasing Cozaar and increasing Verapamil has not bothered her BP control. BP Readings from Last 3 Encounters:  05/23/11 128/60  04/24/11 124/64  03/20/11 112/72

## 2011-05-23 NOTE — Assessment & Plan Note (Signed)
Continues with irregular heartbeat but with better rate control. Cont Verapamil at current dose.

## 2011-05-28 ENCOUNTER — Ambulatory Visit (INDEPENDENT_AMBULATORY_CARE_PROVIDER_SITE_OTHER): Payer: Medicare Other | Admitting: Internal Medicine

## 2011-05-28 DIAGNOSIS — Z7901 Long term (current) use of anticoagulants: Secondary | ICD-10-CM

## 2011-05-28 DIAGNOSIS — Z5181 Encounter for therapeutic drug level monitoring: Secondary | ICD-10-CM

## 2011-05-28 DIAGNOSIS — I4891 Unspecified atrial fibrillation: Secondary | ICD-10-CM

## 2011-05-28 NOTE — Patient Instructions (Signed)
2.5mg  daily (reduced by 2.5 mg) check in 4 weeks.

## 2011-06-10 LAB — COMPREHENSIVE METABOLIC PANEL
ALT: 14
Albumin: 3.7
Alkaline Phosphatase: 68
BUN: 33 — ABNORMAL HIGH
Chloride: 105
Glucose, Bld: 205 — ABNORMAL HIGH
Potassium: 4.5
Total Bilirubin: 0.5

## 2011-06-10 LAB — DIFFERENTIAL
Basophils Absolute: 0
Basophils Relative: 0
Eosinophils Absolute: 0.2
Neutro Abs: 4.6
Neutrophils Relative %: 63

## 2011-06-10 LAB — CBC
MCHC: 33.3
Platelets: 198
RDW: 14

## 2011-06-10 LAB — PROTIME-INR: INR: 2.4 — ABNORMAL HIGH

## 2011-06-25 ENCOUNTER — Ambulatory Visit (INDEPENDENT_AMBULATORY_CARE_PROVIDER_SITE_OTHER): Payer: Medicare Other | Admitting: Family Medicine

## 2011-06-25 DIAGNOSIS — Z5181 Encounter for therapeutic drug level monitoring: Secondary | ICD-10-CM

## 2011-06-25 DIAGNOSIS — I4891 Unspecified atrial fibrillation: Secondary | ICD-10-CM

## 2011-06-25 DIAGNOSIS — Z7901 Long term (current) use of anticoagulants: Secondary | ICD-10-CM

## 2011-06-25 NOTE — Patient Instructions (Signed)
Continue 2.5mg daily  check in 4 weeks. 

## 2011-07-23 ENCOUNTER — Ambulatory Visit (INDEPENDENT_AMBULATORY_CARE_PROVIDER_SITE_OTHER): Payer: Medicare Other | Admitting: Family Medicine

## 2011-07-23 DIAGNOSIS — I4891 Unspecified atrial fibrillation: Secondary | ICD-10-CM

## 2011-07-23 DIAGNOSIS — Z7901 Long term (current) use of anticoagulants: Secondary | ICD-10-CM

## 2011-07-23 DIAGNOSIS — Z5181 Encounter for therapeutic drug level monitoring: Secondary | ICD-10-CM

## 2011-07-23 LAB — POCT INR: INR: 2.3

## 2011-07-23 NOTE — Patient Instructions (Signed)
Continue 2.5mg daily  check in 4 weeks. 

## 2011-08-21 ENCOUNTER — Ambulatory Visit (INDEPENDENT_AMBULATORY_CARE_PROVIDER_SITE_OTHER): Payer: Medicare Other | Admitting: Family Medicine

## 2011-08-21 DIAGNOSIS — I4891 Unspecified atrial fibrillation: Secondary | ICD-10-CM

## 2011-08-21 DIAGNOSIS — Z5181 Encounter for therapeutic drug level monitoring: Secondary | ICD-10-CM

## 2011-08-21 DIAGNOSIS — Z7901 Long term (current) use of anticoagulants: Secondary | ICD-10-CM

## 2011-08-21 NOTE — Patient Instructions (Signed)
Continue 2.5mg  daily  check in 4 weeks.

## 2011-09-10 ENCOUNTER — Ambulatory Visit (INDEPENDENT_AMBULATORY_CARE_PROVIDER_SITE_OTHER): Payer: Medicare Other | Admitting: Family Medicine

## 2011-09-10 ENCOUNTER — Encounter: Payer: Self-pay | Admitting: Family Medicine

## 2011-09-10 VITALS — BP 146/66 | HR 89 | Temp 97.7°F | Wt 143.8 lb

## 2011-09-10 DIAGNOSIS — R0600 Dyspnea, unspecified: Secondary | ICD-10-CM

## 2011-09-10 DIAGNOSIS — R0609 Other forms of dyspnea: Secondary | ICD-10-CM

## 2011-09-10 DIAGNOSIS — E039 Hypothyroidism, unspecified: Secondary | ICD-10-CM

## 2011-09-10 DIAGNOSIS — I509 Heart failure, unspecified: Secondary | ICD-10-CM

## 2011-09-10 DIAGNOSIS — E119 Type 2 diabetes mellitus without complications: Secondary | ICD-10-CM

## 2011-09-10 DIAGNOSIS — I4891 Unspecified atrial fibrillation: Secondary | ICD-10-CM

## 2011-09-10 NOTE — Progress Notes (Signed)
Hypothyroid.  Due for labs.  Compliant with meds.  No neck mass, no neck pain.    H/o copd, CHF.  Prev was on oxygen, but was able to get off it.  Had been doing well until around 07/2011.  She would have an occ spell of weakness, but then had a "bad episode" near [place].  It lasted about 1 hour, had no energy, felt weak.  No unilateral or focal sx, but was generalized.  No syncope, but felt presyncopal.  No CP then.  Wasn't sob.  It gradually passed. Didn't seek medical tx at that time.  It sounds like she is near baseline now in terms of exercise capacity, but not all the way back to pre-12/12 levels. Still with some DOE.  Not sob now, no CP.   PMH and SH reviewed  ROS: See HPI, otherwise noncontributory.  Meds, vitals, and allergies reviewed.   nad ncat Mmm IRR, not tachy ctab No inc in wob abd soft not ttp Ext w/o edema No cyanosis .

## 2011-09-10 NOTE — Patient Instructions (Addendum)
Christine Stone will call about your referral. You can get your results through our phone system.  Follow the instructions on the blue card. Don't change your meds for now.   I want you to see the heart clinic about these episodes of weakness.  Take care.

## 2011-09-11 ENCOUNTER — Encounter: Payer: Self-pay | Admitting: Family Medicine

## 2011-09-11 LAB — COMPREHENSIVE METABOLIC PANEL
Albumin: 3.9 g/dL (ref 3.5–5.2)
Alkaline Phosphatase: 58 U/L (ref 39–117)
BUN: 29 mg/dL — ABNORMAL HIGH (ref 6–23)
CO2: 29 mEq/L (ref 19–32)
Calcium: 9.3 mg/dL (ref 8.4–10.5)
GFR: 29.69 mL/min — ABNORMAL LOW (ref >60.00)
Glucose, Bld: 110 mg/dL — ABNORMAL HIGH (ref 70–99)
Potassium: 4.8 mEq/L (ref 3.5–5.1)

## 2011-09-11 LAB — TSH: TSH: 1.33 u[IU]/mL (ref 0.35–5.50)

## 2011-09-11 LAB — HEMOGLOBIN A1C: Hgb A1c MFr Bld: 6.2 % (ref 4.6–6.5)

## 2011-09-11 NOTE — Assessment & Plan Note (Signed)
Check A1c today, due for this.  No sig elevation of glucose at home per patient.

## 2011-09-11 NOTE — Assessment & Plan Note (Signed)
With nonfocal sx, no weakness in ext or speech changes on exam today.  She could have had an episode of tachycardia, or bradycardia.  In either case, I'd like cards input.  Will refer back to cards.  Check cmet/bnp/tsh today.  Will await labs.  EKG reviewed.  >25 min spent with face to face with patient, >50% counseling and/or coordinating care.

## 2011-09-11 NOTE — Assessment & Plan Note (Signed)
Check TSH, no change in meds at OV.

## 2011-09-12 ENCOUNTER — Other Ambulatory Visit: Payer: Self-pay | Admitting: Family Medicine

## 2011-09-12 ENCOUNTER — Telehealth: Payer: Self-pay | Admitting: Family Medicine

## 2011-09-12 DIAGNOSIS — I10 Essential (primary) hypertension: Secondary | ICD-10-CM

## 2011-09-12 NOTE — Telephone Encounter (Signed)
Please call pt.  A1c is fine, kidney function is slightly worse than prev.  TSH okay. No change in meds at this point.  I would recheck Cr in about 2-3 weeks here.  Keep the f/u OV with cards.  We'll address the Cr when it is rechecked.  Thanks.

## 2011-09-13 NOTE — Telephone Encounter (Signed)
Patient advised.  Lab appt scheduled.  

## 2011-09-18 ENCOUNTER — Ambulatory Visit (INDEPENDENT_AMBULATORY_CARE_PROVIDER_SITE_OTHER): Payer: Medicare Other | Admitting: Family Medicine

## 2011-09-18 DIAGNOSIS — Z7901 Long term (current) use of anticoagulants: Secondary | ICD-10-CM

## 2011-09-18 DIAGNOSIS — Z5181 Encounter for therapeutic drug level monitoring: Secondary | ICD-10-CM

## 2011-09-18 DIAGNOSIS — I4891 Unspecified atrial fibrillation: Secondary | ICD-10-CM

## 2011-09-18 NOTE — Patient Instructions (Signed)
Continue 2.5mg daily  check in 4 weeks. 

## 2011-09-24 ENCOUNTER — Ambulatory Visit (INDEPENDENT_AMBULATORY_CARE_PROVIDER_SITE_OTHER): Payer: Medicare Other | Admitting: Cardiovascular Disease

## 2011-09-24 ENCOUNTER — Encounter: Payer: Self-pay | Admitting: Cardiovascular Disease

## 2011-09-24 DIAGNOSIS — I4891 Unspecified atrial fibrillation: Secondary | ICD-10-CM

## 2011-09-24 DIAGNOSIS — I359 Nonrheumatic aortic valve disorder, unspecified: Secondary | ICD-10-CM

## 2011-09-24 DIAGNOSIS — E785 Hyperlipidemia, unspecified: Secondary | ICD-10-CM

## 2011-09-24 DIAGNOSIS — R5381 Other malaise: Secondary | ICD-10-CM

## 2011-09-24 DIAGNOSIS — I10 Essential (primary) hypertension: Secondary | ICD-10-CM

## 2011-09-24 NOTE — Assessment & Plan Note (Signed)
Blood pressure is well controlled on her current medications. If she has more notable bradycardia or pauses, we will hold the verapamil.

## 2011-09-24 NOTE — Assessment & Plan Note (Signed)
Cholesterol is elevated. She's not particularly interested in a cholesterol medication.

## 2011-09-24 NOTE — Progress Notes (Signed)
Patient ID: Christine Stone, female    DOB: 08/10/1926, 78 y.o.   MRN: [id number]  HPI Comments: Christine Stone is an 78 y/o woman with severe COPD, diabetes, chronic atrial fibrillation and HTN , chronic fatigue, who presents for routine followup.  She was previously seen in the clinic in 2011. At that time, Dr. Hetty Ely decreased her verapamil from 80 mg to 40 mg for heart rate of 56 on EKG.   She presents today and reports that her malaise and fatigue has been worse in the past 6 or more months, worse since December. She feels weak, and typically is very active. She is worried about a slow heart rate. She had an episode on Christmas Day with profound weakness requiring her to sit down lasting for one hour. She feels that atrial fibrillation is causing most of her problems. She does report having a cardioversion years ago and has been on warfarin for 20 years.  She denies any significant lower extremity edema. She does report having "emphysema", smoked for 20 years though stopped 20 years ago. She was exposed to various chemicals where she worked. For a short period of time, for a 210 months, she required oxygen but was able to wean herself off this and now does not feel like she needs it.  She denies any significant chest pain. EKGs shows atrial fibrillation with ventricular rate 70 beats per minute, pause seen estimated at 1.6 seconds. She was asymptomatic       Outpatient Encounter Prescriptions as of 09/24/2011  Medication Sig Dispense Refill  . ibuprofen (ADVIL,MOTRIN) 200 MG tablet Take 200 mg by mouth as needed.        Marland Kitchen levothyroxine (LEVOXYL) 88 MCG tablet Take 88 mcg by mouth daily.        Marland Kitchen losartan (COZAAR) 25 MG tablet Take 1 tablet (25 mg total) by mouth daily.  30 tablet  11  . PROAIR HFA 108 (90 BASE) MCG/ACT inhaler INHALE TWO PUFFS EVERY 4 TO 6 HOURS AS NEEDED  9 g  5  . verapamil (CALAN) 40 MG tablet TAKE ONE TABLET BY MOUTH IN THE MORNING  30 tablet  5  . warfarin  (COUMADIN) 5 MG tablet Take 5 mg by mouth as directed. Use as directed.          Review of Systems  Constitutional: Positive for fatigue.  HENT: Negative.   Eyes: Negative.   Respiratory: Negative.   Cardiovascular: Negative.   Gastrointestinal: Negative.   Musculoskeletal: Negative.   Skin: Negative.   Neurological: Positive for weakness.  Hematological: Negative.   Psychiatric/Behavioral: Negative.   All other systems reviewed and are negative.    BP 127/72  Pulse 64  Ht 5\' 4"  (1.626 m)  Wt 143 lb (64.864 kg)  BMI 24.55 kg/m2  Physical Exam  Nursing note and vitals reviewed. Constitutional: She is oriented to person, place, and time. She appears well-developed and well-nourished.  HENT:  Head: Normocephalic.  Nose: Nose normal.  Mouth/Throat: Oropharynx is clear and moist.  Eyes: Conjunctivae are normal. Pupils are equal, round, and reactive to light.  Neck: Normal range of motion. Neck supple. No JVD present.  Cardiovascular: Normal rate, S1 normal, S2 normal and intact distal pulses.  An irregularly irregular rhythm present. Exam reveals no gallop and no friction rub.   Murmur heard.  Crescendo systolic murmur is present with a grade of 2/6  Pulmonary/Chest: Effort normal and breath sounds normal. No respiratory distress. She has no wheezes. She  has no rales. She exhibits no tenderness.  Abdominal: Soft. Bowel sounds are normal. She exhibits no distension. There is no tenderness.  Musculoskeletal: Normal range of motion. She exhibits no edema and no tenderness.  Lymphadenopathy:    She has no cervical adenopathy.  Neurological: She is alert and oriented to person, place, and time. Coordination normal.  Skin: Skin is warm and dry. No rash noted. No erythema.  Psychiatric: She has a normal mood and affect. Her behavior is normal. Judgment and thought content normal.         Assessment and Plan

## 2011-09-24 NOTE — Assessment & Plan Note (Signed)
Mild aortic valve stenosis on previous echocardiogram.

## 2011-09-24 NOTE — Assessment & Plan Note (Signed)
Heart rate is relatively well controlled though she did have a short pause on EKG today. Uncertain if she has underlying sick sinus syndrome and is having pauses at home contributing to the symptoms on Christmas day with severe weakness. We have suggested she have a Holter done and she would like to wait until she has more symptoms.

## 2011-09-24 NOTE — Patient Instructions (Signed)
You are doing well. No medication changes were made.  If you have more weak spells or dizzy episodes, please call the office (Dr. Mariah Milling)  Please call us if you have new issues that need to be addressed before your next appt.  Your physician wants you to follow-up in: 3 months.  You will receive a reminder letter in the mail two months in advance. If you don't receive a letter, please call our office to schedule the follow-up appointment.

## 2011-09-24 NOTE — Assessment & Plan Note (Signed)
She had symptoms of fatigue and malaise on her last clinic visit over one year ago. Etiology uncertain though could be secondary to underlying atrial fibrillation. She did have an episode of severe weakness on Christmas Day. Uncertain if this is secondary to arrhythmia.   We offered her a Holter monitor to rule out pauses and bradycardia and possible sick sinus syndrome. She was not particularly interested in doing a Holter at this time. I suggested if she had more severe episodes of weakness that she contact our office for the Holter monitor, possibly even the 30 day monitor.  We also talked about echocardiogram though I suspect her mild aortic valve stenosis has not significantly progressed in the past few years. Ejection fraction at that time was normal.  We also talked about stress testing and she was not particularly interested at this time unless her symptoms got worse.

## 2011-10-01 ENCOUNTER — Other Ambulatory Visit (INDEPENDENT_AMBULATORY_CARE_PROVIDER_SITE_OTHER): Payer: Medicare Other

## 2011-10-01 ENCOUNTER — Other Ambulatory Visit: Payer: Self-pay | Admitting: Family Medicine

## 2011-10-01 DIAGNOSIS — I10 Essential (primary) hypertension: Secondary | ICD-10-CM

## 2011-10-03 ENCOUNTER — Encounter: Payer: Self-pay | Admitting: Family Medicine

## 2011-10-03 ENCOUNTER — Other Ambulatory Visit: Payer: Self-pay | Admitting: Family Medicine

## 2011-10-03 DIAGNOSIS — N184 Chronic kidney disease, stage 4 (severe): Secondary | ICD-10-CM

## 2011-10-08 ENCOUNTER — Ambulatory Visit (INDEPENDENT_AMBULATORY_CARE_PROVIDER_SITE_OTHER): Payer: Medicare Other | Admitting: Family Medicine

## 2011-10-08 ENCOUNTER — Encounter: Payer: Self-pay | Admitting: Family Medicine

## 2011-10-08 VITALS — BP 110/60 | HR 66 | Temp 98.1°F | Wt 142.2 lb

## 2011-10-08 DIAGNOSIS — R300 Dysuria: Secondary | ICD-10-CM

## 2011-10-08 DIAGNOSIS — N184 Chronic kidney disease, stage 4 (severe): Secondary | ICD-10-CM

## 2011-10-08 LAB — POCT URINALYSIS DIPSTICK
Bilirubin, UA: NEGATIVE
Ketones, UA: NEGATIVE
Protein, UA: 30
Spec Grav, UA: 1.025
pH, UA: 5

## 2011-10-08 MED ORDER — NITROFURANTOIN MONOHYD MACRO 100 MG PO CAPS: ORAL_CAPSULE | ORAL | Status: AC

## 2011-10-08 NOTE — Patient Instructions (Signed)
Nice to meet you. Please complete course of antibiotic as directed- Macrobid 100 mg twice daily for 10 days. Drink plenty of fluids. Call us if no improvement in the next 3-5 days. We will call you with your urine culture results.

## 2011-10-08 NOTE — Progress Notes (Signed)
SUBJECTIVE: Christine Stone is a 78 y.o. female new to me with complicated medical history including CKD stage IV, recurrent GBS UTIs who complains of urinary frequency, urgency and dysuria x 3 days, without flank pain, fever, chills, or abnormal vaginal discharge or bleeding.   Pt has sulfa allergy and Augmentin intolerance.  Patient Active Problem List  Diagnoses  . NEOPLASM UNCERTAIN BEHAVIOR PLEU THYMUS&MEDIAST  . HYPOTHYROIDISM  . DIABETES MELLITUS, TYPE II  . HYPERLIPIDEMIA  . HYPERTENSION, BENIGN  . AORTIC STENOSIS  . ATRIAL FIBRILLATION -CHRONIC  . CONGESTIVE HEART FAILURE  . ALLERGIC RHINITIS  . EMPHYSEMA  . PULMONARY NODULE  . Chronic kidney disease, stage IV (severe)  . POSTMENOPAUSAL BLEEDING  . VASOVAGAL SYNCOPE  . Other Malaise and Fatigue  . SHORTNESS OF BREATH  . Dysuria   Past Medical History  Diagnosis Date  . Atrial fibrillation     On coumadin   . CHF (congestive heart failure)     Diastolic -- echo 16/10 EF 60%moderate diastolic dysfunction mild AS   . Hyperlipidemia   . Hypothyroidism   . Diabetes mellitus     Type II   . GERD (gastroesophageal reflux disease)     Sleep study   . Chest pain     R/Od A. fib   . Syncope 05-22-08 - 05/23/08    [facility] syncope R/o'd ARI   Past Surgical History  Procedure Date  . Appendectomy 1945  . Partial hysterectomy     Ovaries intact   . Pleural scarification     Pleural Fluid B9   History  Substance Use Topics  . Smoking status: Former Smoker -- 1.0 packs/day for 20 years    Types: Cigarettes    Quit date: 08/27/1990  . Smokeless tobacco: Never Used   Comment: 1/2 ppd x 40 years quit 1988.   Marland Kitchen Alcohol Use: No   Family History  Problem Relation Age of Onset  . Thrombosis Father 48    thrombosis of heart   . Coronary artery disease Father   . Cancer Sister   . Heart failure Sister   . Hypertension Sister   . Cancer Brother     kidney   Allergies  Allergen Reactions  . Loratadine    REACTION: makes head feel funny  . Sulfasalazine    Current Outpatient Prescriptions on File Prior to Visit  Medication Sig Dispense Refill  . ibuprofen (ADVIL,MOTRIN) 200 MG tablet Take 200 mg by mouth as needed.        Marland Kitchen LEVOXYL 88 MCG tablet TAKE ONE TABLET BY MOUTH EVERY DAY  30 each  12  . losartan (COZAAR) 25 MG tablet Take 1 tablet (25 mg total) by mouth daily.  30 tablet  11  . PROAIR HFA 108 (90 BASE) MCG/ACT inhaler INHALE TWO PUFFS EVERY 4 TO 6 HOURS AS NEEDED  9 g  5  . verapamil (CALAN) 40 MG tablet TAKE ONE TABLET BY MOUTH IN THE MORNING  30 tablet  5  . warfarin (COUMADIN) 5 MG tablet Take 5 mg by mouth as directed. Use as directed.        The PMH, PSH, Social History, Family History, Medications, and allergies have been reviewed in [facility], and have been updated if relevant.   OBJECTIVE:  BP 110/60  Pulse 66  Temp(Src) 98.1 F (36.7 C) (Oral)  Wt 142 lb 4 oz (64.524 kg)  Appears well, in no apparent distress.  Vital signs are normal. The abdomen is  soft without tenderness, guarding, mass, rebound or organomegaly. No CVA tenderness or inguinal adenopathy noted. Urine dipstick shows positive for RBC's, positive for protein and positive for leukocytes.    ASSESSMENT: UTI  without evidence of pyelonephritis  PLAN: Treatment per orders - macrobid 100 mg twice daily x 10 days, also push fluids, may use Pyridium OTC prn. Call or return to clinic prn if these symptoms worsen or fail to improve as anticipated.

## 2011-10-16 ENCOUNTER — Ambulatory Visit (INDEPENDENT_AMBULATORY_CARE_PROVIDER_SITE_OTHER): Payer: Medicare Other | Admitting: Family Medicine

## 2011-10-16 DIAGNOSIS — I4891 Unspecified atrial fibrillation: Secondary | ICD-10-CM

## 2011-10-16 DIAGNOSIS — Z7901 Long term (current) use of anticoagulants: Secondary | ICD-10-CM

## 2011-10-16 DIAGNOSIS — Z5181 Encounter for therapeutic drug level monitoring: Secondary | ICD-10-CM

## 2011-10-16 LAB — POCT INR: INR: 1.7

## 2011-10-16 NOTE — Patient Instructions (Addendum)
Take 5 mg today only then continue 2.5mg  daily  check in 4 weeks.

## 2011-11-01 ENCOUNTER — Telehealth: Payer: Self-pay | Admitting: *Deleted

## 2011-11-01 NOTE — Telephone Encounter (Signed)
Faxed

## 2011-11-01 NOTE — Telephone Encounter (Signed)
Signed. Please send in.

## 2011-11-01 NOTE — Telephone Encounter (Signed)
Form for diabetic supplies is on your desk, I checked with the patient and she does want these supplies. 

## 2011-11-05 ENCOUNTER — Telehealth: Payer: Self-pay | Admitting: *Deleted

## 2011-11-05 NOTE — Telephone Encounter (Signed)
Patient states this company called her about this product and she told them that if her Medicare and Insurance would cover it, she would like to have any help she could get with her feet because they bother her a lot.  They phoned her back saying it would be covered between the 2 insurances.  Form is in your in box.

## 2011-11-06 NOTE — Telephone Encounter (Signed)
They are asking for details that I don't have so she'll need on OV to eval and do the form.  I'll hold it in meantime.

## 2011-11-06 NOTE — Telephone Encounter (Signed)
Noted  

## 2011-11-06 NOTE — Telephone Encounter (Signed)
Patient notified as instructed by telephone. Patient stated to disregard the request that she has done without this product this long and it seems to be too much trouble to proceed with. Patient stated that if she changes her mind she will call back.

## 2011-11-13 ENCOUNTER — Ambulatory Visit (INDEPENDENT_AMBULATORY_CARE_PROVIDER_SITE_OTHER): Payer: Medicare Other | Admitting: Family Medicine

## 2011-11-13 DIAGNOSIS — Z7901 Long term (current) use of anticoagulants: Secondary | ICD-10-CM

## 2011-11-13 DIAGNOSIS — Z5181 Encounter for therapeutic drug level monitoring: Secondary | ICD-10-CM

## 2011-11-13 DIAGNOSIS — I4891 Unspecified atrial fibrillation: Secondary | ICD-10-CM

## 2011-11-13 LAB — POCT INR: INR: 2.5

## 2011-11-13 NOTE — Patient Instructions (Signed)
Take 5 mg every FRI, 2.5mg  daily  check in 2 weeks.

## 2011-11-27 ENCOUNTER — Ambulatory Visit (INDEPENDENT_AMBULATORY_CARE_PROVIDER_SITE_OTHER): Payer: Medicare Other | Admitting: Family Medicine

## 2011-11-27 ENCOUNTER — Ambulatory Visit: Payer: Medicare Other

## 2011-11-27 ENCOUNTER — Other Ambulatory Visit: Payer: Self-pay | Admitting: Family Medicine

## 2011-11-27 DIAGNOSIS — Z5181 Encounter for therapeutic drug level monitoring: Secondary | ICD-10-CM

## 2011-11-27 DIAGNOSIS — I4891 Unspecified atrial fibrillation: Secondary | ICD-10-CM

## 2011-11-27 DIAGNOSIS — Z7901 Long term (current) use of anticoagulants: Secondary | ICD-10-CM

## 2011-11-27 LAB — POCT INR: INR: 2.5

## 2011-11-27 NOTE — Patient Instructions (Signed)
ake 5 mg every FRI, 2.5mg  daily  check in 4 weeks.

## 2011-12-23 ENCOUNTER — Ambulatory Visit (INDEPENDENT_AMBULATORY_CARE_PROVIDER_SITE_OTHER): Payer: Medicare Other | Admitting: Family Medicine

## 2011-12-23 ENCOUNTER — Encounter: Payer: Self-pay | Admitting: Family Medicine

## 2011-12-23 VITALS — BP 142/70 | HR 58 | Temp 97.8°F | Wt 145.0 lb

## 2011-12-23 DIAGNOSIS — R1030 Lower abdominal pain, unspecified: Secondary | ICD-10-CM

## 2011-12-23 DIAGNOSIS — J438 Other emphysema: Secondary | ICD-10-CM

## 2011-12-23 DIAGNOSIS — I4891 Unspecified atrial fibrillation: Secondary | ICD-10-CM

## 2011-12-23 DIAGNOSIS — R109 Unspecified abdominal pain: Secondary | ICD-10-CM

## 2011-12-23 NOTE — Progress Notes (Signed)
H/o emphysema and using SABA a little more in the last month, with the season change.  Occ wheeze.  Used the SABA a few times in a month with some relief.    She had another episode of weakness, like the episode near xmax 2012; she felt like her heart was slowing down transiently.  She had seen Dr. Elon Alas and had talked about options.  She declined further prev w/u.  She is still on CCB.   Leg pain.  Pain near L groin for a few days.  Had been working in the yard and the 'deep pain' started.  Pain laying flat.  Sore all the time but worse sometimes than others.  She's sleeping in a chair because that is easier than sleeping in the bad.  Took some generic tylenol with some relief.    PMH and SH reviewed  ROS: See HPI, otherwise noncontributory.  Meds, vitals, and allergies reviewed.   nad ncat mmm ctab IRR but not tachy abd soft L hip not ttp at the greater troch.  Minimally ttp with int/ext rotation but this appears to be along the inguinal canal and not the hip joint itself.  No hernia or skin changes noted.

## 2011-12-23 NOTE — Patient Instructions (Signed)
Take tylenol for pain.  It should gradually get better. Stop the verapamil.  If you have a fast heart rate/palpitations, then restart it and let me know.   If you use the inhaler a few times a month, then that is okay.  If you need it more, let me know.

## 2011-12-24 DIAGNOSIS — R1030 Lower abdominal pain, unspecified: Secondary | ICD-10-CM | POA: Insufficient documentation

## 2011-12-24 NOTE — Assessment & Plan Note (Signed)
Doesn't appear to be in the joint itself and she can weight bear well.  No concern for fx given good weight bearing.  Likely strain, no hernia felt.

## 2011-12-24 NOTE — Assessment & Plan Note (Signed)
Sporadic SABA use, if needed more will notify the clinic.  Okay for outpatient f/u.  Discussed proph vs abortive tx.  She understood.

## 2011-12-24 NOTE — Assessment & Plan Note (Signed)
Likely with brady episode.  Will stop CCB and she'll monitor pulse.  If elevated, will restart and notify me or cards.  She agrees.

## 2011-12-25 ENCOUNTER — Ambulatory Visit (INDEPENDENT_AMBULATORY_CARE_PROVIDER_SITE_OTHER): Payer: Medicare Other | Admitting: Family Medicine

## 2011-12-25 DIAGNOSIS — Z7901 Long term (current) use of anticoagulants: Secondary | ICD-10-CM

## 2011-12-25 DIAGNOSIS — Z5181 Encounter for therapeutic drug level monitoring: Secondary | ICD-10-CM

## 2011-12-25 DIAGNOSIS — I4891 Unspecified atrial fibrillation: Secondary | ICD-10-CM

## 2011-12-25 NOTE — Patient Instructions (Signed)
Take 5 mg every FRI, 2.5mg  daily  check in 4 weeks.

## 2012-01-06 ENCOUNTER — Ambulatory Visit
Admission: RE | Admit: 2012-01-06 | Discharge: 2012-01-06 | Disposition: A | Discharge status: Home / Self Care | Payer: Medicare Other | Source: Ambulatory Visit | Attending: Family Medicine | Admitting: Family Medicine

## 2012-01-06 ENCOUNTER — Ambulatory Visit (INDEPENDENT_AMBULATORY_CARE_PROVIDER_SITE_OTHER): Payer: Medicare Other | Admitting: Family Medicine

## 2012-01-06 ENCOUNTER — Encounter: Payer: Self-pay | Admitting: Family Medicine

## 2012-01-06 ENCOUNTER — Ambulatory Visit (INDEPENDENT_AMBULATORY_CARE_PROVIDER_SITE_OTHER)
Admission: RE | Admit: 2012-01-06 | Discharge: 2012-01-06 | Disposition: A | Discharge status: Home / Self Care | Payer: Medicare Other | Source: Ambulatory Visit | Attending: Family Medicine | Admitting: Family Medicine

## 2012-01-06 VITALS — BP 132/84 | HR 88 | Temp 97.6°F | Wt 144.2 lb

## 2012-01-06 DIAGNOSIS — R109 Unspecified abdominal pain: Secondary | ICD-10-CM

## 2012-01-06 DIAGNOSIS — M25552 Pain in left hip: Secondary | ICD-10-CM | POA: Insufficient documentation

## 2012-01-06 DIAGNOSIS — R1030 Lower abdominal pain, unspecified: Secondary | ICD-10-CM

## 2012-01-06 DIAGNOSIS — M25559 Pain in unspecified hip: Secondary | ICD-10-CM

## 2012-01-06 DIAGNOSIS — Z5181 Encounter for therapeutic drug level monitoring: Secondary | ICD-10-CM

## 2012-01-06 DIAGNOSIS — I4891 Unspecified atrial fibrillation: Secondary | ICD-10-CM

## 2012-01-06 DIAGNOSIS — Z7901 Long term (current) use of anticoagulants: Secondary | ICD-10-CM

## 2012-01-06 LAB — POCT INR: INR: 4.1

## 2012-01-06 NOTE — Assessment & Plan Note (Signed)
Checked xrays today - no evidence of hip or pelvic fracture.  ?groin strain On coumadin, check INR today as well as CBC to eval for bleed. If anemic - check further imaging (ie CT LLE). In meantime, recommend use scheduled tylenol.   Pt states very sensitive to narcotics so avoided today.

## 2012-01-06 NOTE — Progress Notes (Signed)
  Subjective:    Patient ID: Christine Stone, female    DOB: 1926-04-05, 78 y.o.   MRN: [id number]  HPI CC: L leg pain  Pleasant 78 yo pt of Dr. Ashok Cordia with h/o atrial fibrillation, T2DM, emphysema, HTN, HLD, hypothyroid, stage IV CKD, CHF and aortic stenosis, hard of hearing presents to discuss left leg pain for several weeks.  Present since late April, prior eval by PCP thought to be groin strain, no hernia appreciated.  Started after working in yard.  Getting worse instead of better.  Using muscle rub for pain, using tylenol twice daily, at times pain shoots down leg.  Worse at night.  Sleeping on back.  Trouble getting up, husband has had to help her with transfers.  Was using sheet to pull herself out of bed 2/2 L leg pain, now left shoulder started hurting 2/2 exhertion.  Denies recent falls or injury to left leg.  Not on bisphosphonate.  Uses lift chair at home.  On coumadin, last INR was 3.5. Lab Results  Component Value Date   INR 3.5 12/25/2011   INR 2.5 11/27/2011   INR 2.5 11/13/2011   No recent travel, immobility.  No personal h/o blood clots.  No previous pain like this in past.  Denies back pain, denies shooting pain down buttock.    Past Medical History  Diagnosis Date  . Atrial fibrillation     On coumadin   . CHF (congestive heart failure)     Diastolic -- echo 40/98 EF 60%moderate diastolic dysfunction mild AS   . Hyperlipidemia   . Hypothyroidism   . Diabetes mellitus     Type II   . GERD (gastroesophageal reflux disease)     Sleep study   . Chest pain     R/Od A. fib   . Syncope 05-22-08 - 05/23/08    [facility] syncope R/o'd ARI    Review of Systems Per HPI    Objective:   Physical Exam  Nursing note and vitals reviewed. Constitutional: She appears well-developed and well-nourished.       Uncomfortable with position changes 2/2 left groin/hip pain  Abdominal:       No hernia appreciated  Musculoskeletal: Normal range of motion.       No midline spine  tenderness, no paraspinous mm tenderness. No pain at SIJ or GTB bilaterally. Some discomfort with palpation of left sciatic notch. Standing neutral left posterior pelvis displaced posteriorly compared to right side Tender to palpation at left groin region. Neg SLR bilaterally Pain with int/ext rotation of left hip.  Neurological: She has normal reflexes. No sensory deficit.  Reflex Scores:      Patellar reflexes are 2+ on the right side and 2+ on the left side.      Achilles reflexes are 2+ on the right side and 2+ on the left side.      Slightly diminished strength LLE, thought 2/2 pain.  Skin: Skin is warm and dry.       No bruising       Assessment & Plan:

## 2012-01-06 NOTE — Patient Instructions (Addendum)
xrays today - no fracture on my read.  If change based on radiology read, we will call you. Take tylenol 500mg  2 pills 2-3 times daily. We will check INR and blood count today. Let me know if not improving or any worsening.

## 2012-01-06 NOTE — Assessment & Plan Note (Signed)
INR 4.1 - too high.  Held coumadin for next 2 days then recheck in middle of week.

## 2012-01-07 LAB — CBC WITH DIFFERENTIAL/PLATELET
Basophils Relative: 0.5 % (ref 0.0–3.0)
Eosinophils Absolute: 0.2 10*3/uL (ref 0.0–0.7)
Eosinophils Relative: 2.3 % (ref 0.0–5.0)
Lymphocytes Relative: 24.6 % (ref 12.0–46.0)
Neutrophils Relative %: 64.4 % (ref 43.0–77.0)
Platelets: 210 10*3/uL (ref 150.0–400.0)
RBC: 4.16 Mil/uL (ref 3.87–5.11)
WBC: 9.3 10*3/uL (ref 4.5–10.5)

## 2012-01-08 ENCOUNTER — Ambulatory Visit (INDEPENDENT_AMBULATORY_CARE_PROVIDER_SITE_OTHER): Payer: Medicare Other | Admitting: Family Medicine

## 2012-01-08 ENCOUNTER — Telehealth: Payer: Self-pay | Admitting: *Deleted

## 2012-01-08 DIAGNOSIS — I4891 Unspecified atrial fibrillation: Secondary | ICD-10-CM

## 2012-01-08 DIAGNOSIS — Z7901 Long term (current) use of anticoagulants: Secondary | ICD-10-CM

## 2012-01-08 DIAGNOSIS — Z5181 Encounter for therapeutic drug level monitoring: Secondary | ICD-10-CM

## 2012-01-08 LAB — POCT INR: INR: 2.8

## 2012-01-08 MED ORDER — HYDROCODONE-ACETAMINOPHEN 5-500 MG PO TABS: 0.5000 | ORAL_TABLET | Freq: Three times a day (TID) | ORAL | Status: AC | PRN

## 2012-01-08 NOTE — Patient Instructions (Signed)
Decrease to 2.5mg  daily  recheck in 1 week.

## 2012-01-08 NOTE — Telephone Encounter (Signed)
Pt c/o pain in shoulders from having to push herself up due to hip, leg pain, previously refused pain med, but now she would like a muscle relaxer.

## 2012-01-08 NOTE — Telephone Encounter (Signed)
Rx called in as directed. Patient advised. She said her leg pain maybe has improved slightly, but now her shoulders are giving her a fit because of having to push herself up. She will try the vicodin and see if it helps. If not, she will call back.

## 2012-01-08 NOTE — Telephone Encounter (Signed)
i'd like [place] to try narcotic - vicodin.  Have her take 1/2 pill to see if will help.  plz phone in. Any improvement in leg pain?

## 2012-01-10 ENCOUNTER — Telehealth: Payer: Self-pay

## 2012-01-10 ENCOUNTER — Ambulatory Visit (INDEPENDENT_AMBULATORY_CARE_PROVIDER_SITE_OTHER)
Admission: RE | Admit: 2012-01-10 | Discharge: 2012-01-10 | Disposition: A | Discharge status: Home / Self Care | Payer: Medicare Other | Source: Ambulatory Visit | Attending: Family Medicine | Admitting: Family Medicine

## 2012-01-10 ENCOUNTER — Other Ambulatory Visit: Payer: Self-pay | Admitting: Family Medicine

## 2012-01-10 DIAGNOSIS — R1030 Lower abdominal pain, unspecified: Secondary | ICD-10-CM

## 2012-01-10 DIAGNOSIS — M25552 Pain in left hip: Secondary | ICD-10-CM

## 2012-01-10 DIAGNOSIS — M25559 Pain in unspecified hip: Secondary | ICD-10-CM

## 2012-01-10 DIAGNOSIS — R109 Unspecified abdominal pain: Secondary | ICD-10-CM

## 2012-01-10 NOTE — Telephone Encounter (Signed)
Patient notified. She will come in today for xrays and begin whole hydrocodone instead of 1/2. Understood possible ortho referral.

## 2012-01-10 NOTE — Telephone Encounter (Signed)
?  radiculopathy - can we have her come in today for lumbar xrays.  If normal, will recommend referral to ortho for further evaluation given worsening pain despite treatment approach.  I want her to take whole hydrocodone pill at a time.

## 2012-01-10 NOTE — Telephone Encounter (Signed)
Pt seen 01/06/12 and left groin pain has worsened; pain radiates down lt leg, pain level now is 7. Pt having trouble getting up and new symptom is pt walking with cane due to leg pain when walks now. Hydrocodone/apap not helping pain. Pt has no abdominal or back pain.pt uses [facility] [address] and can be reached [phone].

## 2012-01-12 ENCOUNTER — Other Ambulatory Visit: Payer: Self-pay | Admitting: Family Medicine

## 2012-01-12 DIAGNOSIS — I714 Abdominal aortic aneurysm, without rupture: Secondary | ICD-10-CM | POA: Insufficient documentation

## 2012-01-12 DIAGNOSIS — Z136 Encounter for screening for cardiovascular disorders: Secondary | ICD-10-CM

## 2012-01-13 ENCOUNTER — Other Ambulatory Visit: Payer: Self-pay | Admitting: Family Medicine

## 2012-01-13 ENCOUNTER — Telehealth: Payer: Self-pay | Admitting: Family Medicine

## 2012-01-13 DIAGNOSIS — M25552 Pain in left hip: Secondary | ICD-10-CM

## 2012-01-13 DIAGNOSIS — M25512 Pain in left shoulder: Secondary | ICD-10-CM

## 2012-01-13 DIAGNOSIS — R1030 Lower abdominal pain, unspecified: Secondary | ICD-10-CM

## 2012-01-13 MED ORDER — METHOCARBAMOL 500 MG PO TABS: 500.0000 mg | ORAL_TABLET | Freq: Four times a day (QID) | ORAL | Status: AC

## 2012-01-13 MED ORDER — CYCLOBENZAPRINE HCL 5 MG PO TABS: 5.0000 mg | ORAL_TABLET | Freq: Two times a day (BID) | ORAL | Status: DC | PRN

## 2012-01-13 NOTE — Telephone Encounter (Signed)
Will change to robaxin 500mg  po qid prn pain.  #40, 1rf.  I called in the rx.

## 2012-01-13 NOTE — Telephone Encounter (Signed)
Pharmacy questions if patient has congestive heart failure because Flexoril has been prescribed and is contraindication if patient has congestive heart failure.  Please return Kimberly's call at [facility] at [phone].

## 2012-01-14 ENCOUNTER — Other Ambulatory Visit: Payer: Self-pay | Admitting: *Deleted

## 2012-01-14 MED ORDER — LEVOXYL 88 MCG PO TABS: ORAL_TABLET | ORAL | Status: DC

## 2012-01-22 ENCOUNTER — Ambulatory Visit (INDEPENDENT_AMBULATORY_CARE_PROVIDER_SITE_OTHER): Payer: Medicare Other | Admitting: Family Medicine

## 2012-01-22 DIAGNOSIS — Z7901 Long term (current) use of anticoagulants: Secondary | ICD-10-CM

## 2012-01-22 DIAGNOSIS — Z5181 Encounter for therapeutic drug level monitoring: Secondary | ICD-10-CM

## 2012-01-22 DIAGNOSIS — I4891 Unspecified atrial fibrillation: Secondary | ICD-10-CM

## 2012-01-22 NOTE — Patient Instructions (Signed)
Continue current dose, check in 4 weeks  

## 2012-01-27 ENCOUNTER — Telehealth: Payer: Self-pay | Admitting: Family Medicine

## 2012-01-27 DIAGNOSIS — M791 Myalgia, unspecified site: Secondary | ICD-10-CM

## 2012-01-27 NOTE — Telephone Encounter (Signed)
Patient wants to know if she should still get the US done that is scheduled on 01/30/12 since she has had all of these x-rays?  She is willing to come to see you for an OV and/or get labs.  Please advise.

## 2012-01-27 NOTE — Telephone Encounter (Signed)
Call taken from Dr. Zachery Dauer.  He was concerned about possible PMR.  He was going to talk with patient about checking a sed rate.  Based on his report, I think she wanted to f/u here.  I think checking the sed rate is a good idea.  I would either get it done at a lab visit, or if she wants to see me about it first then set up OV.  Order is in.  Thanks.

## 2012-01-28 NOTE — Telephone Encounter (Signed)
Patient advised.  Lab appt scheduled.  

## 2012-01-28 NOTE — Telephone Encounter (Signed)
Yes, I'd still get the u/s.  It's looking at a different (potential) issue.  I would get a lab visit, unless she wants to see me, for the sed rate.  Thanks.

## 2012-01-30 ENCOUNTER — Encounter (INDEPENDENT_AMBULATORY_CARE_PROVIDER_SITE_OTHER): Payer: Medicare Other

## 2012-01-30 ENCOUNTER — Other Ambulatory Visit: Payer: Medicare Other

## 2012-01-30 ENCOUNTER — Ambulatory Visit (INDEPENDENT_AMBULATORY_CARE_PROVIDER_SITE_OTHER): Payer: Medicare Other | Admitting: Family Medicine

## 2012-01-30 DIAGNOSIS — IMO0001 Reserved for inherently not codable concepts without codable children: Secondary | ICD-10-CM

## 2012-01-30 DIAGNOSIS — M791 Myalgia, unspecified site: Secondary | ICD-10-CM

## 2012-01-30 DIAGNOSIS — Z7901 Long term (current) use of anticoagulants: Secondary | ICD-10-CM

## 2012-01-30 DIAGNOSIS — I4891 Unspecified atrial fibrillation: Secondary | ICD-10-CM

## 2012-01-30 DIAGNOSIS — I714 Abdominal aortic aneurysm, without rupture: Secondary | ICD-10-CM

## 2012-01-30 DIAGNOSIS — Z5181 Encounter for therapeutic drug level monitoring: Secondary | ICD-10-CM

## 2012-01-30 DIAGNOSIS — R109 Unspecified abdominal pain: Secondary | ICD-10-CM

## 2012-01-30 DIAGNOSIS — Z136 Encounter for screening for cardiovascular disorders: Secondary | ICD-10-CM

## 2012-01-30 LAB — POCT INR: INR: 2.3

## 2012-01-30 NOTE — Patient Instructions (Signed)
Continue 2.5mg  daily  recheck in 4 weeks.

## 2012-01-31 MED ORDER — PREDNISONE 10 MG PO TABS: 10.0000 mg | ORAL_TABLET | Freq: Every day | ORAL | Status: DC

## 2012-01-31 NOTE — Progress Notes (Signed)
Addended by: Lars Mage on: 01/31/2012 12:23 PM   Modules accepted: Orders

## 2012-02-05 ENCOUNTER — Encounter: Payer: Self-pay | Admitting: Family Medicine

## 2012-02-05 ENCOUNTER — Ambulatory Visit: Payer: Medicare Other

## 2012-02-10 ENCOUNTER — Ambulatory Visit (INDEPENDENT_AMBULATORY_CARE_PROVIDER_SITE_OTHER): Payer: Medicare Other | Admitting: Family Medicine

## 2012-02-10 ENCOUNTER — Encounter: Payer: Self-pay | Admitting: Family Medicine

## 2012-02-10 VITALS — BP 108/80 | HR 67 | Temp 98.1°F | Wt 139.8 lb

## 2012-02-10 DIAGNOSIS — M353 Polymyalgia rheumatica: Secondary | ICD-10-CM

## 2012-02-10 DIAGNOSIS — E119 Type 2 diabetes mellitus without complications: Secondary | ICD-10-CM

## 2012-02-10 LAB — BASIC METABOLIC PANEL
GFR: 40.44 mL/min — ABNORMAL LOW (ref >60.00)
Glucose, Bld: 120 mg/dL — ABNORMAL HIGH (ref 70–99)
Potassium: 5.1 mEq/L (ref 3.5–5.1)
Sodium: 141 mEq/L (ref 135–145)

## 2012-02-10 LAB — HEMOGLOBIN A1C: Hgb A1c MFr Bld: 6.5 % (ref 4.6–6.5)

## 2012-02-10 MED ORDER — PREDNISONE 10 MG PO TABS: 10.0000 mg | ORAL_TABLET | Freq: Every day | ORAL | Status: DC

## 2012-02-10 NOTE — Progress Notes (Signed)
Shoulder pain is much better by 3rd day of prednisone.  Her leg pain is better.  ESR was elevated.  She's sore from trimming hedges but she's still felling better.  She couldn't have done that before the prednisone.  She feels weaker than baseline (but recently improved), but her diet was off due to pain prev.  She is likely relatively deconditioned per her report.  Sugar was prev 140 or lower, now 120-~200, usually on the lower end of that range.  Tolerating prednisone 10mg  a day in meantime o/w.    Labs d/w pt.    PMH and SH reviewed  ROS: See HPI, otherwise noncontributory.  Meds, vitals, and allergies reviewed.   nad ncat Neck supple, no LA rrr ctab Ext w/o edema Normal rom at the shoulders, hips, and rom is w/o pain.

## 2012-02-10 NOTE — Patient Instructions (Addendum)
It looks like you have PMR (polymyalgia rheumtica).  It causes aching muscles in the arms and legs.  It will usually get better with a slow taper of prednisone over a period of months.  You need to take the prednisone with food and keep track of your sugar.  If your sugar level is consistent above 150, then notify me.  Plan on coming back in 6 weeks.  Take care.   We'll contact you with your lab report.

## 2012-02-11 ENCOUNTER — Encounter: Payer: Self-pay | Admitting: Family Medicine

## 2012-02-11 DIAGNOSIS — M353 Polymyalgia rheumatica: Secondary | ICD-10-CM | POA: Insufficient documentation

## 2012-02-11 NOTE — Assessment & Plan Note (Signed)
ESR was elevated, sx improved on day 3 of treatment.  Sugar at reasonable level for now.  Steroids tolerated, GI caution given.  Appears not to be fluid overloaded now.  Continue current dose, will try to limit steroid load/duration.  She'll monitor sugar and will plan on f/u in ~6 weeks.  She agrees.  Much improved.  Labs and dx d/w pt. >25 min spent with face to face with patient, >50% counseling and/or coordinating care

## 2012-02-24 ENCOUNTER — Ambulatory Visit (INDEPENDENT_AMBULATORY_CARE_PROVIDER_SITE_OTHER): Payer: Medicare Other | Admitting: Family Medicine

## 2012-02-24 DIAGNOSIS — I4891 Unspecified atrial fibrillation: Secondary | ICD-10-CM

## 2012-02-24 DIAGNOSIS — Z7901 Long term (current) use of anticoagulants: Secondary | ICD-10-CM

## 2012-02-24 DIAGNOSIS — Z5181 Encounter for therapeutic drug level monitoring: Secondary | ICD-10-CM

## 2012-02-24 LAB — POCT INR: INR: 2.9

## 2012-02-24 NOTE — Patient Instructions (Signed)
Continue current dose, check in 4 weeks  

## 2012-02-28 ENCOUNTER — Ambulatory Visit: Payer: Medicare Other

## 2012-03-11 ENCOUNTER — Other Ambulatory Visit: Payer: Self-pay | Admitting: Family Medicine

## 2012-03-23 ENCOUNTER — Ambulatory Visit (INDEPENDENT_AMBULATORY_CARE_PROVIDER_SITE_OTHER): Payer: Medicare Other | Admitting: Family Medicine

## 2012-03-23 ENCOUNTER — Encounter: Payer: Self-pay | Admitting: Family Medicine

## 2012-03-23 VITALS — BP 142/76 | HR 83 | Temp 98.2°F | Wt 139.8 lb

## 2012-03-23 DIAGNOSIS — R7309 Other abnormal glucose: Secondary | ICD-10-CM

## 2012-03-23 DIAGNOSIS — M353 Polymyalgia rheumatica: Secondary | ICD-10-CM

## 2012-03-23 DIAGNOSIS — Z5181 Encounter for therapeutic drug level monitoring: Secondary | ICD-10-CM

## 2012-03-23 DIAGNOSIS — Z7901 Long term (current) use of anticoagulants: Secondary | ICD-10-CM

## 2012-03-23 DIAGNOSIS — I4891 Unspecified atrial fibrillation: Secondary | ICD-10-CM

## 2012-03-23 DIAGNOSIS — R739 Hyperglycemia, unspecified: Secondary | ICD-10-CM

## 2012-03-23 LAB — POCT INR: INR: 4.5

## 2012-03-23 MED ORDER — PREDNISONE 5 MG PO TABS: 5.0000 mg | ORAL_TABLET | Freq: Every day | ORAL | Status: DC

## 2012-03-23 MED ORDER — PREDNISONE 1 MG PO TABS: 3.0000 mg | ORAL_TABLET | Freq: Every day | ORAL | Status: DC

## 2012-03-23 MED ORDER — PREDNISONE 1 MG PO TABS: 4.0000 mg | ORAL_TABLET | Freq: Every day | ORAL | Status: DC

## 2012-03-23 MED ORDER — PREDNISONE 5 MG PO TABS: 5.0000 mg | ORAL_TABLET | Freq: Every day | ORAL | Status: AC

## 2012-03-23 MED ORDER — PREDNISONE 1 MG PO TABS: 2.0000 mg | ORAL_TABLET | Freq: Every day | ORAL | Status: DC

## 2012-03-23 MED ORDER — PREDNISONE 1 MG PO TABS: 1.0000 mg | ORAL_TABLET | Freq: Every day | ORAL | Status: AC

## 2012-03-23 NOTE — Progress Notes (Signed)
PMR f/u.  Sugar is mildly elevated but still usually less than 150 at 2 hours after eating.  Aches are resolved.  On 10mg  of prednisone a day.  Asking about taper.  Discussed.  Some heartburn since starting prednisone along with some mild insomnia.  Steroid precautions discussed.   Meds, vitals, and allergies reviewed.   ROS: See HPI.  Otherwise, noncontributory.  nad ncat Mmm rrr ctab abd soft, not tt Ext w/o edema No muscle weakness on exam grossly x4

## 2012-03-23 NOTE — Patient Instructions (Addendum)
Take 5mg  + 4mg  for 30 days.  Take 5mg  + 3mg  for 30 days.  Take 5mg  + 2mg  for 30 days.  Take 5mg  + 1mg  for 30 days.  Take 5mg  for 30 days.  Check A1c (sugar test) in 10/13.  Office visit with Para March in 12/13.  Take the prednisone with food.  If the aches return, notify me.

## 2012-03-23 NOTE — Patient Instructions (Signed)
Hold this Tues and Wed , then  2.5mg  daily except DO NOT TAKE ANY EVERY SAT check this FRI

## 2012-03-24 NOTE — Assessment & Plan Note (Signed)
Will taper by 1mg  per month, 9 mg in August, 8mg  in September, 7mg  in October, etc.  Will use 5mg  tab + Xmg (with 1mg  tabs) to make up the difference.  She agrees.  Recheck A1c later in the fall.  F/u with me in 12/13.  She agrees.  Steroid cautions given.

## 2012-03-27 ENCOUNTER — Ambulatory Visit (INDEPENDENT_AMBULATORY_CARE_PROVIDER_SITE_OTHER): Payer: Medicare Other | Admitting: Family Medicine

## 2012-03-27 DIAGNOSIS — Z5181 Encounter for therapeutic drug level monitoring: Secondary | ICD-10-CM

## 2012-03-27 DIAGNOSIS — I4891 Unspecified atrial fibrillation: Secondary | ICD-10-CM

## 2012-03-27 DIAGNOSIS — Z7901 Long term (current) use of anticoagulants: Secondary | ICD-10-CM

## 2012-03-27 LAB — POCT INR: INR: 2.3

## 2012-03-27 NOTE — Patient Instructions (Signed)
2.5mg  daily except DO NOT TAKE ANY ON SATURDAY'S 2 weeks

## 2012-04-10 ENCOUNTER — Ambulatory Visit (INDEPENDENT_AMBULATORY_CARE_PROVIDER_SITE_OTHER): Payer: Medicare Other | Admitting: Family Medicine

## 2012-04-10 ENCOUNTER — Encounter: Payer: Self-pay | Admitting: Family Medicine

## 2012-04-10 VITALS — BP 120/60 | HR 86 | Temp 97.5°F | Wt 136.8 lb

## 2012-04-10 DIAGNOSIS — R319 Hematuria, unspecified: Secondary | ICD-10-CM

## 2012-04-10 DIAGNOSIS — R42 Dizziness and giddiness: Secondary | ICD-10-CM

## 2012-04-10 DIAGNOSIS — R531 Weakness: Secondary | ICD-10-CM

## 2012-04-10 DIAGNOSIS — I10 Essential (primary) hypertension: Secondary | ICD-10-CM

## 2012-04-10 DIAGNOSIS — Z5181 Encounter for therapeutic drug level monitoring: Secondary | ICD-10-CM

## 2012-04-10 DIAGNOSIS — R5383 Other fatigue: Secondary | ICD-10-CM

## 2012-04-10 DIAGNOSIS — I4891 Unspecified atrial fibrillation: Secondary | ICD-10-CM

## 2012-04-10 DIAGNOSIS — Z7901 Long term (current) use of anticoagulants: Secondary | ICD-10-CM

## 2012-04-10 LAB — CBC WITH DIFFERENTIAL/PLATELET
Basophils Absolute: 0 10*3/uL (ref 0.0–0.1)
Eosinophils Absolute: 0.1 10*3/uL (ref 0.0–0.7)
Eosinophils Relative: 1 % (ref 0.0–5.0)
Lymphs Abs: 1.9 10*3/uL (ref 0.7–4.0)
MCV: 91.4 fl (ref 78.0–100.0)
Monocytes Absolute: 0.6 10*3/uL (ref 0.1–1.0)
Neutrophils Relative %: 78 % — ABNORMAL HIGH (ref 43.0–77.0)
Platelets: 238 10*3/uL (ref 150.0–400.0)
RDW: 15.3 % — ABNORMAL HIGH (ref 11.5–14.6)
WBC: 11.9 10*3/uL — ABNORMAL HIGH (ref 4.5–10.5)

## 2012-04-10 LAB — POCT URINALYSIS DIPSTICK
Ketones, UA: NEGATIVE
Nitrite, UA: NEGATIVE
Urobilinogen, UA: NEGATIVE
pH, UA: 6.5

## 2012-04-10 LAB — BASIC METABOLIC PANEL
BUN: 30 mg/dL — ABNORMAL HIGH (ref 6–23)
Chloride: 101 mEq/L (ref 96–112)
Creatinine, Ser: 1.8 mg/dL — ABNORMAL HIGH (ref 0.4–1.2)
Glucose, Bld: 168 mg/dL — ABNORMAL HIGH (ref 70–99)

## 2012-04-10 MED ORDER — LOSARTAN POTASSIUM 25 MG PO TABS: ORAL_TABLET | ORAL | Status: DC

## 2012-04-10 NOTE — Patient Instructions (Signed)
2.5mg daily except DO NOT TAKE ANY ON SATURDAY'S 2 weeks 

## 2012-04-10 NOTE — Progress Notes (Signed)
BP was elevated earlier this week.  Pred was tapered at the start of 8/13. She doesn't have return of PMR aches.  She feels weak and light headed.  No syncope.  Sweaty episodically.  She's had some intermittent chest pain at rest under the left breast, 2-3 seconds of sharp pain that will self resolve.  No exertional CP.  CP free now.  SOB can happen, worse with high humidity and hot days.  No fevers.  No med changes.  Sugar has been a little higher than normal, max 180, was 120 this AM.    Meds, vitals, and allergies reviewed.   ROS: See HPI.  Otherwise, noncontributory.  nad ncat Mmm IRR not tachy ctab abd soft, not ttp.  Ext w/o edema No rash  EKG, u/a noted and reviewed.

## 2012-04-10 NOTE — Patient Instructions (Signed)
Go to the lab on the way out.  We'll contact you with your lab report. Don't take the losartan until we talk next week.  Let me know about your blood pressure and how you feel next week.

## 2012-04-12 DIAGNOSIS — R531 Weakness: Secondary | ICD-10-CM | POA: Insufficient documentation

## 2012-04-12 NOTE — Assessment & Plan Note (Signed)
This doesn't appear to be a primary cardiopulmonary issue- she doesn't appear to have acutely decompensated pulmonary or cardiac function.  I doubt this is steroid related. She has been light headed so we'll hold the ARB for now.  Check ucx- will rx when sensitivity is ready (we had discussed starting if the ucx was positive) and recheck BMET later in the week. >25 min spent with face to face with patient, >50% counseling and/or coordinating care.

## 2012-04-13 ENCOUNTER — Other Ambulatory Visit: Payer: Self-pay | Admitting: *Deleted

## 2012-04-13 LAB — URINE CULTURE: Colony Count: >=100000

## 2012-04-13 MED ORDER — CEPHALEXIN 500 MG PO CAPS: 500.0000 mg | ORAL_CAPSULE | Freq: Two times a day (BID) | ORAL | Status: AC

## 2012-04-16 ENCOUNTER — Ambulatory Visit (INDEPENDENT_AMBULATORY_CARE_PROVIDER_SITE_OTHER): Payer: Medicare Other | Admitting: Family Medicine

## 2012-04-16 DIAGNOSIS — I10 Essential (primary) hypertension: Secondary | ICD-10-CM

## 2012-04-16 DIAGNOSIS — I4891 Unspecified atrial fibrillation: Secondary | ICD-10-CM

## 2012-04-16 DIAGNOSIS — Z5181 Encounter for therapeutic drug level monitoring: Secondary | ICD-10-CM

## 2012-04-16 DIAGNOSIS — Z7901 Long term (current) use of anticoagulants: Secondary | ICD-10-CM

## 2012-04-16 LAB — BASIC METABOLIC PANEL
BUN: 29 mg/dL — ABNORMAL HIGH (ref 6–23)
Calcium: 9.6 mg/dL (ref 8.4–10.5)
GFR: 36.01 mL/min — ABNORMAL LOW (ref >60.00)
Glucose, Bld: 99 mg/dL (ref 70–99)

## 2012-04-16 NOTE — Patient Instructions (Signed)
2.5mg  daily except DO NOT TAKE ANY ON  Mon/ Wed/SAT recheck 1 week

## 2012-04-24 ENCOUNTER — Ambulatory Visit (INDEPENDENT_AMBULATORY_CARE_PROVIDER_SITE_OTHER): Payer: Medicare Other | Admitting: Family Medicine

## 2012-04-24 DIAGNOSIS — Z5181 Encounter for therapeutic drug level monitoring: Secondary | ICD-10-CM

## 2012-04-24 DIAGNOSIS — Z7901 Long term (current) use of anticoagulants: Secondary | ICD-10-CM

## 2012-04-24 DIAGNOSIS — I4891 Unspecified atrial fibrillation: Secondary | ICD-10-CM

## 2012-04-24 NOTE — Patient Instructions (Signed)
Continue current dose, check in 4 weeks  

## 2012-05-22 ENCOUNTER — Ambulatory Visit (INDEPENDENT_AMBULATORY_CARE_PROVIDER_SITE_OTHER): Payer: Medicare Other | Admitting: Family Medicine

## 2012-05-22 DIAGNOSIS — Z7901 Long term (current) use of anticoagulants: Secondary | ICD-10-CM

## 2012-05-22 DIAGNOSIS — Z5181 Encounter for therapeutic drug level monitoring: Secondary | ICD-10-CM

## 2012-05-22 DIAGNOSIS — I4891 Unspecified atrial fibrillation: Secondary | ICD-10-CM

## 2012-05-22 NOTE — Patient Instructions (Signed)
Continue current dose, check in 4 weeks  

## 2012-05-25 ENCOUNTER — Ambulatory Visit (INDEPENDENT_AMBULATORY_CARE_PROVIDER_SITE_OTHER): Payer: Medicare Other

## 2012-05-25 ENCOUNTER — Encounter: Payer: Self-pay | Admitting: Family Medicine

## 2012-05-25 DIAGNOSIS — Z23 Encounter for immunization: Secondary | ICD-10-CM

## 2012-06-19 ENCOUNTER — Other Ambulatory Visit: Payer: Self-pay | Admitting: *Deleted

## 2012-06-19 ENCOUNTER — Encounter: Payer: Self-pay | Admitting: *Deleted

## 2012-06-19 ENCOUNTER — Ambulatory Visit (INDEPENDENT_AMBULATORY_CARE_PROVIDER_SITE_OTHER): Payer: Medicare Other | Admitting: Family Medicine

## 2012-06-19 DIAGNOSIS — Z7901 Long term (current) use of anticoagulants: Secondary | ICD-10-CM

## 2012-06-19 DIAGNOSIS — Z5181 Encounter for therapeutic drug level monitoring: Secondary | ICD-10-CM

## 2012-06-19 DIAGNOSIS — R7309 Other abnormal glucose: Secondary | ICD-10-CM

## 2012-06-19 DIAGNOSIS — I4891 Unspecified atrial fibrillation: Secondary | ICD-10-CM

## 2012-06-19 DIAGNOSIS — R739 Hyperglycemia, unspecified: Secondary | ICD-10-CM

## 2012-06-19 LAB — HEMOGLOBIN A1C: Hgb A1c MFr Bld: 6.4 % (ref 4.6–6.5)

## 2012-06-19 MED ORDER — WARFARIN SODIUM 1 MG PO TABS: 1.0000 mg | ORAL_TABLET | ORAL | Status: DC

## 2012-06-19 NOTE — Patient Instructions (Signed)
2 mg daily, except 2.5 mg T/TH.SUN. Reduced dose by 1.5 mg weekly. Check in 2 weeks

## 2012-07-03 ENCOUNTER — Ambulatory Visit (INDEPENDENT_AMBULATORY_CARE_PROVIDER_SITE_OTHER): Payer: Medicare Other | Admitting: Family Medicine

## 2012-07-03 ENCOUNTER — Encounter: Payer: Self-pay | Admitting: Family Medicine

## 2012-07-03 VITALS — BP 130/82 | HR 67 | Temp 98.3°F | Ht 64.0 in | Wt 140.4 lb

## 2012-07-03 DIAGNOSIS — Z7901 Long term (current) use of anticoagulants: Secondary | ICD-10-CM

## 2012-07-03 DIAGNOSIS — Z5181 Encounter for therapeutic drug level monitoring: Secondary | ICD-10-CM

## 2012-07-03 DIAGNOSIS — I4891 Unspecified atrial fibrillation: Secondary | ICD-10-CM

## 2012-07-03 DIAGNOSIS — R319 Hematuria, unspecified: Secondary | ICD-10-CM

## 2012-07-03 LAB — POCT URINALYSIS DIPSTICK
Bilirubin, UA: NEGATIVE
Ketones, UA: NEGATIVE
Protein, UA: NEGATIVE
pH, UA: 5

## 2012-07-03 LAB — POCT UA - MICROSCOPIC ONLY

## 2012-07-03 LAB — POCT INR: INR: 1.5

## 2012-07-03 MED ORDER — CIPROFLOXACIN HCL 250 MG PO TABS: 250.0000 mg | ORAL_TABLET | Freq: Two times a day (BID) | ORAL | Status: DC

## 2012-07-03 NOTE — Progress Notes (Signed)
  Subjective:    Patient ID: Christine Stone, female    DOB: 10-28-1925, 78 y.o.   MRN: [id number]  HPI  78 year old female with DM, HTN, CKD, Afib (coumadin, todays INR 1.3), CHF presents with new blood in urine few days ago. Noted on underware pad, none in toilet. She has history of recurrent GBS UTIs.  She has  had  recent UTI, last in 03/2012.Ivan Croft, pansensitive.      She has also noted pressure on her bladder, some uncomfortable sensation earlier in week. Some frequency and urgency. Improved with increase in water.  Without flank pain, fever, chills, or abnormal vaginal discharge.   Since then she has not had any symptoms and has not noted any more blood.  Review of Systems  Constitutional: Negative for fever and fatigue.  HENT: Negative for ear pain.   Eyes: Negative for pain.  Respiratory: Negative for chest tightness and shortness of breath.   Cardiovascular: Negative for chest pain, palpitations and leg swelling.  Gastrointestinal: Negative for abdominal pain.       Objective:   Physical Exam  Constitutional: She appears well-developed and well-nourished.  HENT:  Head: Normocephalic.  Cardiovascular: Normal rate and regular rhythm.   No murmur heard. Pulmonary/Chest: Effort normal and breath sounds normal.  Abdominal: Soft. Bowel sounds are normal. There is no tenderness.  Skin: Skin is warm and dry.          Assessment & Plan:

## 2012-07-03 NOTE — Assessment & Plan Note (Addendum)
Most consistent with UTI. Send for culture. Start on cipro... INR is subtheraputic, she has follow up INR in 1 week.  Follow up for recehck UA to make sure hematuria resolved in 2 weeks.

## 2012-07-03 NOTE — Patient Instructions (Signed)
2.5 mg daily, except 2 mg T/TH check in 1 week( increased 2 mg weekly, stepping down dose of steroids)

## 2012-07-03 NOTE — Patient Instructions (Signed)
Start antibiotics for urinary infection. We will call with culture results. Make appt to see me or PCP in 2 weeks for recheck UA to make sure blood resolved in urine. Push fluids some, Rest.  Call if fever or not improving.

## 2012-07-10 ENCOUNTER — Telehealth: Payer: Self-pay | Admitting: *Deleted

## 2012-07-10 ENCOUNTER — Ambulatory Visit (INDEPENDENT_AMBULATORY_CARE_PROVIDER_SITE_OTHER): Payer: Medicare Other | Admitting: Family Medicine

## 2012-07-10 DIAGNOSIS — Z5181 Encounter for therapeutic drug level monitoring: Secondary | ICD-10-CM

## 2012-07-10 DIAGNOSIS — Z7901 Long term (current) use of anticoagulants: Secondary | ICD-10-CM

## 2012-07-10 DIAGNOSIS — I4891 Unspecified atrial fibrillation: Secondary | ICD-10-CM

## 2012-07-10 LAB — POCT INR: INR: 2

## 2012-07-10 NOTE — Telephone Encounter (Signed)
Patient advised.

## 2012-07-10 NOTE — Patient Instructions (Addendum)
Continue 2.5 mg daily, except 2 mg T/TH check in 1 week at Dr appt  I would change to 2mg  today, Saturday, but skip on Sunday.  Take 2mg  on Monday and recheck on Tuesday since she is going to be on cipro-- GSD

## 2012-07-10 NOTE — Telephone Encounter (Signed)
Message copied by Annamarie Major on Fri Jul 10, 2012  2:31 PM ------      Message from: Joaquim Nam      Created: Fri Jul 10, 2012  1:08 PM       See instructions re: coumadin on INR lab visit since she is going to be on cipro.  2mg  today, Saturday, skip Sunday, 2mg  Monday and then recheck Tuesday.

## 2012-07-10 NOTE — Telephone Encounter (Signed)
Pt was prescribed an abx for uti, she hasn't started taking it yet because she received info from pharmacy that cipro could cause tendon problems with people taking steroids. She has been having shoulder and back pain for over a year and read that symptoms can present months after taking cipro. She has taken it twice a year for uti's for the past couple years. Please advise pt is concerned that abx might be causing her pain and need something for her uti.

## 2012-07-10 NOTE — Telephone Encounter (Signed)
I think that is not likely to be causing any of her symptoms in her back.  I would still take the cipro as the other docs had rx'd as the bacteria was sensitive to that.  Also, it is a low dose of the abx.

## 2012-07-14 ENCOUNTER — Ambulatory Visit (INDEPENDENT_AMBULATORY_CARE_PROVIDER_SITE_OTHER): Payer: Medicare Other | Admitting: Family Medicine

## 2012-07-14 DIAGNOSIS — Z5181 Encounter for therapeutic drug level monitoring: Secondary | ICD-10-CM

## 2012-07-14 DIAGNOSIS — Z7901 Long term (current) use of anticoagulants: Secondary | ICD-10-CM

## 2012-07-14 DIAGNOSIS — I4891 Unspecified atrial fibrillation: Secondary | ICD-10-CM

## 2012-07-14 NOTE — Patient Instructions (Signed)
restart 2.5 mg daily, except 2 mg T/TH check recheck 2 weeks

## 2012-07-17 ENCOUNTER — Ambulatory Visit: Payer: Medicare Other

## 2012-07-17 ENCOUNTER — Encounter: Payer: Self-pay | Admitting: Family Medicine

## 2012-07-17 ENCOUNTER — Ambulatory Visit (INDEPENDENT_AMBULATORY_CARE_PROVIDER_SITE_OTHER): Payer: Medicare Other | Admitting: Family Medicine

## 2012-07-17 VITALS — BP 144/78 | HR 96 | Temp 98.2°F | Wt 140.0 lb

## 2012-07-17 DIAGNOSIS — Z7901 Long term (current) use of anticoagulants: Secondary | ICD-10-CM

## 2012-07-17 DIAGNOSIS — Z5181 Encounter for therapeutic drug level monitoring: Secondary | ICD-10-CM

## 2012-07-17 DIAGNOSIS — E119 Type 2 diabetes mellitus without complications: Secondary | ICD-10-CM

## 2012-07-17 DIAGNOSIS — I4891 Unspecified atrial fibrillation: Secondary | ICD-10-CM

## 2012-07-17 DIAGNOSIS — M353 Polymyalgia rheumatica: Secondary | ICD-10-CM

## 2012-07-17 DIAGNOSIS — R319 Hematuria, unspecified: Secondary | ICD-10-CM

## 2012-07-17 LAB — POCT INR: INR: 1.6

## 2012-07-17 MED ORDER — PREDNISONE 5 MG PO TABS: 5.0000 mg | ORAL_TABLET | Freq: Every day | ORAL | Status: DC

## 2012-07-17 NOTE — Progress Notes (Signed)
We talked about the PMR and prednisone.  She thought the potential tendon issues related to cipro caused the shoulder pain.  I have talked with her in detail that she has PMR and this doesn't appear to be in any way related to the abx prev used.  She is much improved from a PMR standpoint in the midst of pred taper.  We talked about the pred taper and plan for the next few months.    She has some pressure with urination due to inc in fluid intake.  Minimal burning now, this is improved- it seems to be triggered by certain foods/drinks.  She is on the last day of cipro rx.  The burning is much improved.  No fevers.   Due for INR.   Meds, vitals, and allergies reviewed.   ROS: See HPI.  Otherwise, noncontributory.  nad ncat Mmm IRR not tachy ctab abd soft, not ttp Ext w/o edema

## 2012-07-17 NOTE — Patient Instructions (Addendum)
Go to the lab on the way out.  We'll contact you with your lab report. Finish the antibiotics today.  If the burning with urination comes back, then notify the clinic.  When you finish the 5+1mg  of prednisone, take 5mg  a day for 1 month.   When you are finishing the 5mg /day of prednisone, notify the clinic and we'll take care of the remaining prednisone prescriptions.

## 2012-07-18 NOTE — Assessment & Plan Note (Signed)
Improved, no need to recheck u/a today as she delayed the start of the cipro. Would recheck in 2 weeks at next INR check.

## 2012-07-18 NOTE — Assessment & Plan Note (Signed)
Continue taper, will take 5mg  a day in 12/13.  She'll call about about getting 4/3/2/1 mg per month when running low on 5mg s.  Currently on 6mg  a day. Reviewed in detail.  Arm pain is resolved at this point. No apparent relation to the abx. >25 min spent with face to face with patient, >50% counseling and/or coordinating care

## 2012-07-21 ENCOUNTER — Other Ambulatory Visit: Payer: Self-pay | Admitting: Family Medicine

## 2012-07-21 DIAGNOSIS — R319 Hematuria, unspecified: Secondary | ICD-10-CM

## 2012-07-30 ENCOUNTER — Encounter: Payer: Self-pay | Admitting: *Deleted

## 2012-07-30 ENCOUNTER — Ambulatory Visit (INDEPENDENT_AMBULATORY_CARE_PROVIDER_SITE_OTHER): Payer: Medicare Other | Admitting: General Practice

## 2012-07-30 ENCOUNTER — Other Ambulatory Visit (INDEPENDENT_AMBULATORY_CARE_PROVIDER_SITE_OTHER): Payer: Medicare Other

## 2012-07-30 DIAGNOSIS — I4891 Unspecified atrial fibrillation: Secondary | ICD-10-CM

## 2012-07-30 DIAGNOSIS — Z7901 Long term (current) use of anticoagulants: Secondary | ICD-10-CM

## 2012-07-30 DIAGNOSIS — E119 Type 2 diabetes mellitus without complications: Secondary | ICD-10-CM

## 2012-07-30 LAB — BASIC METABOLIC PANEL
CO2: 29 mEq/L (ref 19–32)
Calcium: 9.2 mg/dL (ref 8.4–10.5)
Creatinine, Ser: 1.5 mg/dL — ABNORMAL HIGH (ref 0.4–1.2)
GFR: 34.36 mL/min — ABNORMAL LOW (ref >60.00)
Sodium: 140 mEq/L (ref 135–145)

## 2012-07-31 ENCOUNTER — Inpatient Hospital Stay: Payer: Self-pay | Admitting: Internal Medicine

## 2012-07-31 LAB — TSH: Thyroid Stimulating Horm: 1.05 u[IU]/mL

## 2012-07-31 LAB — CBC
HGB: 12.2 g/dL (ref 12.0–16.0)
MCH: 29.8 pg (ref 26.0–34.0)
MCHC: 33.3 g/dL (ref 32.0–36.0)
MCV: 90 fL (ref 80–100)
Platelet: 174 10*3/uL (ref 150–440)
RBC: 4.1 10*6/uL (ref 3.80–5.20)
WBC: 10 10*3/uL (ref 3.6–11.0)

## 2012-07-31 LAB — BASIC METABOLIC PANEL
BUN: 26 mg/dL — ABNORMAL HIGH (ref 7–18)
Creatinine: 1.43 mg/dL — ABNORMAL HIGH (ref 0.60–1.30)
EGFR (Non-African Amer.): 34 — ABNORMAL LOW
Glucose: 109 mg/dL — ABNORMAL HIGH (ref 65–99)
Osmolality: 283 (ref 275–301)
Potassium: 4.1 mmol/L (ref 3.5–5.1)
Sodium: 139 mmol/L (ref 136–145)

## 2012-07-31 LAB — URINALYSIS, COMPLETE
Bacteria: NONE SEEN
Glucose,UR: NEGATIVE mg/dL (ref 0–75)
Hyaline Cast: 3
Ketone: NEGATIVE
RBC,UR: 3 /HPF (ref 0–5)
Specific Gravity: 1.019 (ref 1.003–1.030)
Squamous Epithelial: <1
WBC UR: 3 /HPF (ref 0–5)

## 2012-08-01 ENCOUNTER — Ambulatory Visit: Payer: Self-pay | Admitting: Orthopedic Surgery

## 2012-08-01 LAB — BASIC METABOLIC PANEL
Anion Gap: 6 — ABNORMAL LOW (ref 7–16)
BUN: 24 mg/dL — ABNORMAL HIGH (ref 7–18)
Calcium, Total: 8.5 mg/dL (ref 8.5–10.1)
Creatinine: 1.4 mg/dL — ABNORMAL HIGH (ref 0.60–1.30)
EGFR (African American): 40 — ABNORMAL LOW
EGFR (Non-African Amer.): 34 — ABNORMAL LOW
Glucose: 130 mg/dL — ABNORMAL HIGH (ref 65–99)
Osmolality: 285 (ref 275–301)
Potassium: 3.9 mmol/L (ref 3.5–5.1)

## 2012-08-01 LAB — PROTIME-INR
INR: 1.4
Prothrombin Time: 17.5 secs — ABNORMAL HIGH (ref 11.5–14.7)

## 2012-08-01 LAB — CBC WITH DIFFERENTIAL/PLATELET
Basophil %: 0.3 %
Eosinophil %: 1.2 %
HGB: 12 g/dL (ref 12.0–16.0)
Lymphocyte %: 20.4 %
Monocyte #: 1 x10 3/mm — ABNORMAL HIGH (ref 0.2–0.9)
Neutrophil %: 69.7 %
RBC: 4.05 10*6/uL (ref 3.80–5.20)

## 2012-08-02 LAB — BASIC METABOLIC PANEL
Anion Gap: 5 — ABNORMAL LOW (ref 7–16)
Calcium, Total: 8.7 mg/dL (ref 8.5–10.1)
Chloride: 106 mmol/L (ref 98–107)
Co2: 28 mmol/L (ref 21–32)
EGFR (Non-African Amer.): 40 — ABNORMAL LOW
Osmolality: 278 (ref 275–301)
Sodium: 139 mmol/L (ref 136–145)

## 2012-08-02 LAB — PROTIME-INR: Prothrombin Time: 18.5 secs — ABNORMAL HIGH (ref 11.5–14.7)

## 2012-08-03 ENCOUNTER — Telehealth: Payer: Self-pay

## 2012-08-03 NOTE — Telephone Encounter (Signed)
Erica at [facility] said pt is being discharged to [facility] today with pelvic fx. Pt scheduled for PT/INR and f/u appt to Dr Para March for discharge from [facility] on 08/06/12. Pt upset because will have to come by ambulance; pt cannot afford cost of ambulance ride to come for doctors appt. And Alcario Drought wants to know if labs can be drawn today for PT/INR and then pt come later date for f/u appt.Please advise.

## 2012-08-03 NOTE — Telephone Encounter (Signed)
If she can't come for the OV, they'll need home health set up for blood draws.  They should set this up before leaving [facility].  Once [facility] is set up, we can give an order for blood draws via [facility].  Thanks.

## 2012-08-03 NOTE — Telephone Encounter (Signed)
Erika at [facility] advised.

## 2012-08-04 ENCOUNTER — Encounter: Payer: Self-pay | Admitting: Internal Medicine

## 2012-08-06 ENCOUNTER — Encounter: Payer: Self-pay | Admitting: Family Medicine

## 2012-08-06 ENCOUNTER — Ambulatory Visit: Payer: Medicare Other

## 2012-08-06 ENCOUNTER — Ambulatory Visit: Payer: Medicare Other | Admitting: Family Medicine

## 2012-08-06 DIAGNOSIS — S32599A Other specified fracture of unspecified pubis, initial encounter for closed fracture: Secondary | ICD-10-CM | POA: Insufficient documentation

## 2012-08-06 LAB — PROTIME-INR: INR: 1.6

## 2012-08-07 NOTE — Assessment & Plan Note (Signed)
Appointment cancelled

## 2012-08-07 NOTE — Progress Notes (Signed)
Appointment cancelled

## 2012-08-08 LAB — CBC WITH DIFFERENTIAL/PLATELET
Basophil #: 0 10*3/uL (ref 0.0–0.1)
Basophil %: 0.6 %
Eosinophil %: 3.5 %
HCT: 40 % (ref 35.0–47.0)
HGB: 13 g/dL (ref 12.0–16.0)
MCH: 29.4 pg (ref 26.0–34.0)
MCHC: 32.6 g/dL (ref 32.0–36.0)
Monocyte #: 0.7 x10 3/mm (ref 0.2–0.9)
Neutrophil %: 64.6 %
Platelet: 262 10*3/uL (ref 150–440)
RBC: 4.44 10*6/uL (ref 3.80–5.20)

## 2012-08-08 LAB — BASIC METABOLIC PANEL
Anion Gap: 7 (ref 7–16)
BUN: 40 mg/dL — ABNORMAL HIGH (ref 7–18)
Chloride: 101 mmol/L (ref 98–107)
Co2: 29 mmol/L (ref 21–32)
Creatinine: 1.52 mg/dL — ABNORMAL HIGH (ref 0.60–1.30)
EGFR (African American): 36 — ABNORMAL LOW
EGFR (Non-African Amer.): 31 — ABNORMAL LOW
Osmolality: 287 (ref 275–301)

## 2012-08-11 LAB — PROTIME-INR
INR: 3.1
Prothrombin Time: 31.9 secs — ABNORMAL HIGH (ref 11.5–14.7)

## 2012-08-13 LAB — PROTIME-INR
INR: 4.3
Prothrombin Time: 41.1 secs — ABNORMAL HIGH (ref 11.5–14.7)

## 2012-08-18 LAB — BASIC METABOLIC PANEL
Anion Gap: 8 (ref 7–16)
BUN: 34 mg/dL — ABNORMAL HIGH (ref 7–18)
Chloride: 104 mmol/L (ref 98–107)
Co2: 27 mmol/L (ref 21–32)
Creatinine: 1.55 mg/dL — ABNORMAL HIGH (ref 0.60–1.30)
EGFR (African American): 35 — ABNORMAL LOW
Glucose: 83 mg/dL (ref 65–99)
Potassium: 4.4 mmol/L (ref 3.5–5.1)
Sodium: 139 mmol/L (ref 136–145)

## 2012-08-18 LAB — PROTIME-INR
INR: 2.4
Prothrombin Time: 26 secs — ABNORMAL HIGH (ref 11.5–14.7)

## 2012-08-20 LAB — PROTIME-INR
INR: 2.5
Prothrombin Time: 27.1 secs — ABNORMAL HIGH (ref 11.5–14.7)

## 2012-08-25 LAB — PROTIME-INR
INR: 2
Prothrombin Time: 22.7 secs — ABNORMAL HIGH (ref 11.5–14.7)

## 2012-08-26 ENCOUNTER — Encounter: Payer: Self-pay | Admitting: Internal Medicine

## 2012-09-01 LAB — PROTIME-INR: INR: 2.6

## 2012-09-08 ENCOUNTER — Ambulatory Visit (INDEPENDENT_AMBULATORY_CARE_PROVIDER_SITE_OTHER): Payer: Medicare Other | Admitting: Family Medicine

## 2012-09-08 ENCOUNTER — Encounter: Payer: Self-pay | Admitting: Family Medicine

## 2012-09-08 VITALS — BP 122/66 | HR 87 | Temp 98.2°F | Wt 136.0 lb

## 2012-09-08 DIAGNOSIS — M949 Disorder of cartilage, unspecified: Secondary | ICD-10-CM

## 2012-09-08 DIAGNOSIS — S32509A Unspecified fracture of unspecified pubis, initial encounter for closed fracture: Secondary | ICD-10-CM

## 2012-09-08 DIAGNOSIS — M899 Disorder of bone, unspecified: Secondary | ICD-10-CM

## 2012-09-08 DIAGNOSIS — S32599A Other specified fracture of unspecified pubis, initial encounter for closed fracture: Secondary | ICD-10-CM

## 2012-09-08 DIAGNOSIS — M353 Polymyalgia rheumatica: Secondary | ICD-10-CM

## 2012-09-08 DIAGNOSIS — I10 Essential (primary) hypertension: Secondary | ICD-10-CM

## 2012-09-08 DIAGNOSIS — Z5181 Encounter for therapeutic drug level monitoring: Secondary | ICD-10-CM

## 2012-09-08 DIAGNOSIS — I4891 Unspecified atrial fibrillation: Secondary | ICD-10-CM

## 2012-09-08 DIAGNOSIS — Z7901 Long term (current) use of anticoagulants: Secondary | ICD-10-CM

## 2012-09-08 DIAGNOSIS — T148XXA Other injury of unspecified body region, initial encounter: Secondary | ICD-10-CM

## 2012-09-08 LAB — BASIC METABOLIC PANEL
BUN: 23 mg/dL (ref 6–23)
CO2: 28 mEq/L (ref 19–32)
Chloride: 105 mEq/L (ref 96–112)
Creatinine, Ser: 1.4 mg/dL — ABNORMAL HIGH (ref 0.4–1.2)
Glucose, Bld: 150 mg/dL — ABNORMAL HIGH (ref 70–99)

## 2012-09-08 LAB — CBC WITH DIFFERENTIAL/PLATELET
Basophils Relative: 0.3 % (ref 0.0–3.0)
Eosinophils Absolute: 0.2 10*3/uL (ref 0.0–0.7)
Eosinophils Relative: 1.4 % (ref 0.0–5.0)
Hemoglobin: 12 g/dL (ref 12.0–15.0)
Lymphocytes Relative: 12.8 % (ref 12.0–46.0)
MCHC: 32.7 g/dL (ref 30.0–36.0)
Neutro Abs: 9.4 10*3/uL — ABNORMAL HIGH (ref 1.4–7.7)
RBC: 4.06 Mil/uL (ref 3.87–5.11)

## 2012-09-08 MED ORDER — PREDNISONE 1 MG PO TABS: 1.0000 mg | ORAL_TABLET | Freq: Every day | ORAL | Status: DC

## 2012-09-08 MED ORDER — PREDNISONE 1 MG PO TABS: 4.0000 mg | ORAL_TABLET | Freq: Every day | ORAL | Status: DC

## 2012-09-08 MED ORDER — PREDNISONE 1 MG PO TABS: 2.0000 mg | ORAL_TABLET | Freq: Every day | ORAL | Status: DC

## 2012-09-08 MED ORDER — PREDNISONE 1 MG PO TABS: 3.0000 mg | ORAL_TABLET | Freq: Every day | ORAL | Status: DC

## 2012-09-08 NOTE — Assessment & Plan Note (Signed)
Back on coumadin, check INR today and her lytes given the ARF in the hospital.  She has no edema and appear euvolemic. >25 min spent with face to face with patient, >50% counseling and/or coordinating care

## 2012-09-08 NOTE — Progress Notes (Signed)
L pubic ramus fx.  Fell at home.  To [facility], admitted, anemia note and followed as inpatient.  Pain controlled with opiates initially, hgb was stable and transferred to rehab.  In interval continued on steroids for PMR with prev aches controlled.  She likely had component of ARF during admission.  Now restarted on coumadin with some bruising on the L arm, where she had been scratching her elbow at night.  No trauma o/w.    Now walking with a cane.  L hip region is sore but not painful.  Using a walker only at night.  Has 'graduated' from HHPT.  No other falls.  D/w pt about DXA.   PMH and SH reviewed  ROS: See HPI, otherwise noncontributory.  Meds, vitals, and allergies reviewed.   nad ncat Irr, not tachy ctab abd soft Ext w/o edema Bruising on L elbow noted but no dec in ROM Walking with cane, minimal limp on L side

## 2012-09-08 NOTE — Assessment & Plan Note (Signed)
Pain controlled, done with rehab and walking well.  We talked about DXA and she wanted to defer for now.  D/w pt about risks/benefits and she would like to put this on hold.  This is reasonable.  Will check CBC given the prev hematoma/anemia, see notes on labs.  Check vit D today.

## 2012-09-08 NOTE — Assessment & Plan Note (Signed)
Will continue taper, finish the 5mg  pills she has and then change to 4-->3-->2-->1mg  per day over the next 4 months.  She agrees.

## 2012-09-08 NOTE — Patient Instructions (Signed)
Go to the lab on the way out.  We'll contact you with your lab report.  Finish the 5mg  prednisone tabs, then change to 4mg  for 30 days, then 3mg  for 30 days, then 2mg  for 30 days, then 1mg  for 30 days. Plan to recheck with Para March in 3 months.  We can recheck your sugar then and talk about the possible bone density test.

## 2012-09-09 LAB — VITAMIN D 25 HYDROXY (VIT D DEFICIENCY, FRACTURES): Vit D, 25-Hydroxy: 26 ng/mL — ABNORMAL LOW (ref 30–89)

## 2012-09-17 ENCOUNTER — Ambulatory Visit: Payer: Medicare Other

## 2012-09-20 DIAGNOSIS — N189 Chronic kidney disease, unspecified: Secondary | ICD-10-CM

## 2012-09-20 DIAGNOSIS — E119 Type 2 diabetes mellitus without complications: Secondary | ICD-10-CM

## 2012-09-20 DIAGNOSIS — IMO0001 Reserved for inherently not codable concepts without codable children: Secondary | ICD-10-CM

## 2012-09-20 DIAGNOSIS — S3289XA Fracture of other parts of pelvis, initial encounter for closed fracture: Secondary | ICD-10-CM

## 2012-09-21 ENCOUNTER — Ambulatory Visit: Payer: Medicare Other

## 2012-09-23 ENCOUNTER — Encounter: Payer: Self-pay | Admitting: Family Medicine

## 2012-09-23 ENCOUNTER — Ambulatory Visit (INDEPENDENT_AMBULATORY_CARE_PROVIDER_SITE_OTHER): Payer: Medicare Other | Admitting: Family Medicine

## 2012-09-23 VITALS — BP 120/58 | HR 76 | Temp 97.7°F | Wt 133.0 lb

## 2012-09-23 DIAGNOSIS — M549 Dorsalgia, unspecified: Secondary | ICD-10-CM

## 2012-09-23 DIAGNOSIS — I4891 Unspecified atrial fibrillation: Secondary | ICD-10-CM

## 2012-09-23 LAB — PROTIME-INR: INR: 2.6 ratio — ABNORMAL HIGH (ref 0.8–1.0)

## 2012-09-23 MED ORDER — TRAMADOL HCL 50 MG PO TABS: 50.0000 mg | ORAL_TABLET | Freq: Two times a day (BID) | ORAL | Status: DC | PRN

## 2012-09-23 NOTE — Patient Instructions (Signed)
Go to the lab on the way out.  We'll contact you with your lab report.  Take 2 tylenol at a time, up to 3 times a day (6 pills per day), for pain.  If that doesn't help, then use the tramadol for pain.  Keep working on the exercises for your legs.   Take care.

## 2012-09-23 NOTE — Progress Notes (Signed)
Ramus fx on L recently.  No pain on L side but now with pain on R side of back but not midline.  No falls.  Going on for about 1 week.  "I was thinking it was going to go away."  She has been using a walker.  Getting out of bed is most difficult for her.  She had 'graduated' from HHPT.  No pain sitting down.  Pain when weight bearing.  Pain starts immediately with standing.  No radicular pain.  No troubles with BM or UOP.  No fevers.  No rash.  She tired aspercreme w/o much relief.  She can get nauseated from the pain but this resolves.  She has taken some tylenol, usually at night, only about 2 a day.    Still on coumadin.  Due for recheck.  No missed doses.  No bleeding.  Occ superficial bruising, but this was at baseline.    On 5mg  a day of prednisone for a few more days.  She has a plan for the taper by 1mg  per month.    Meds, vitals, and allergies reviewed.   ROS: See HPI.  Otherwise, noncontributory.  nad ncat Mmm Neck supple IRR with murmur noted abd soft, not ttp Back w/o midline pain No rash ttp near R SI R SI pain on testing Distally nv intact

## 2012-09-24 DIAGNOSIS — M549 Dorsalgia, unspecified: Secondary | ICD-10-CM | POA: Insufficient documentation

## 2012-09-24 NOTE — Assessment & Plan Note (Signed)
Likely SI irritation from compensation.  D/w pt.  No need to image today.  Continue exercises for L leg to inc strength and dec need for compensation.  Continue walker for now for off loading and stability.  Inc to 2 tylenol tid and then tramadol bid prn for pain.  She agrees.  F/u prn.

## 2012-09-29 ENCOUNTER — Other Ambulatory Visit: Payer: Self-pay | Admitting: *Deleted

## 2012-09-29 MED ORDER — VALSARTAN 80 MG PO TABS: 80.0000 mg | ORAL_TABLET | Freq: Every day | ORAL | Status: DC

## 2012-09-29 MED ORDER — VERAPAMIL HCL 80 MG PO TABS: 80.0000 mg | ORAL_TABLET | Freq: Every day | ORAL | Status: DC

## 2012-09-29 MED ORDER — WARFARIN SODIUM 2 MG PO TABS: ORAL_TABLET | ORAL | Status: DC

## 2012-10-19 ENCOUNTER — Other Ambulatory Visit: Payer: Self-pay | Admitting: *Deleted

## 2012-10-19 MED ORDER — LEVOXYL 88 MCG PO TABS: ORAL_TABLET | ORAL | Status: DC

## 2012-10-22 ENCOUNTER — Ambulatory Visit (INDEPENDENT_AMBULATORY_CARE_PROVIDER_SITE_OTHER): Payer: Medicare Other | Admitting: General Practice

## 2012-10-22 ENCOUNTER — Telehealth: Payer: Self-pay | Admitting: Family Medicine

## 2012-10-22 DIAGNOSIS — Z5181 Encounter for therapeutic drug level monitoring: Secondary | ICD-10-CM

## 2012-10-22 DIAGNOSIS — I4891 Unspecified atrial fibrillation: Secondary | ICD-10-CM

## 2012-10-22 DIAGNOSIS — Z7901 Long term (current) use of anticoagulants: Secondary | ICD-10-CM

## 2012-10-22 NOTE — Telephone Encounter (Signed)
Pt came in today.  I talked with her.  Please call pharmacy and clarify if there was a change in her levothyroid.  She also had a much higher price for valsartan.  See if another ARB would be much cheaper for her.  Thanks.

## 2012-10-26 ENCOUNTER — Encounter: Payer: Self-pay | Admitting: *Deleted

## 2012-10-26 NOTE — Telephone Encounter (Signed)
Faxed note to pharmacy. 

## 2012-10-27 ENCOUNTER — Encounter: Payer: Self-pay | Admitting: *Deleted

## 2012-10-29 ENCOUNTER — Telehealth: Payer: Self-pay

## 2012-10-29 ENCOUNTER — Ambulatory Visit (INDEPENDENT_AMBULATORY_CARE_PROVIDER_SITE_OTHER): Payer: Medicare Other | Admitting: General Practice

## 2012-10-29 DIAGNOSIS — Z7901 Long term (current) use of anticoagulants: Secondary | ICD-10-CM

## 2012-10-29 DIAGNOSIS — I4891 Unspecified atrial fibrillation: Secondary | ICD-10-CM

## 2012-10-29 DIAGNOSIS — Z5181 Encounter for therapeutic drug level monitoring: Secondary | ICD-10-CM

## 2012-10-29 NOTE — Telephone Encounter (Signed)
Pharmacy has no way of knowing what each insurance company will pay for.  The patient should call her insurance company and ask which medication (ARB) is covered by the insurance.  Patient came by the office for other reasons and I shared this information with her in person.  She agreed.

## 2012-10-29 NOTE — Telephone Encounter (Signed)
Please call back to the pharmacy and see if another ARB is less expensive.  They didn't respond to that on the fax today.

## 2012-10-29 NOTE — Telephone Encounter (Signed)
Pt said when seen 09/23/12 discussed with Dr Para March about getting less expensive med to replace Diovan. Pt has not heard back and needs to refill Diovan today if there is no substitute. Please advise. [facility] [address] Pt request call back today.

## 2012-10-29 NOTE — Telephone Encounter (Signed)
Note returned from [facility] saying that Brand Name was called in on Levoxyl.  Patient says she will call insurance company to get info and let us know.

## 2012-11-18 ENCOUNTER — Telehealth: Payer: Self-pay | Admitting: Family Medicine

## 2012-11-18 NOTE — Telephone Encounter (Signed)
Okay to change?  Thanks!   

## 2012-11-18 NOTE — Telephone Encounter (Signed)
Pharmacy Sel[facility] Batten, [place])  is calling to get authorization to change Levoxyl from brand name to generic.  Pt is there and requesting the change due to cost reasons.  Per standing orders, RN is able to authorize the change from brand name to generic for cost reasons.  No other action needed.Marland Kitchen

## 2012-11-19 ENCOUNTER — Ambulatory Visit (INDEPENDENT_AMBULATORY_CARE_PROVIDER_SITE_OTHER): Payer: Medicare Other | Admitting: General Practice

## 2012-11-19 DIAGNOSIS — Z5181 Encounter for therapeutic drug level monitoring: Secondary | ICD-10-CM

## 2012-11-19 DIAGNOSIS — Z7901 Long term (current) use of anticoagulants: Secondary | ICD-10-CM

## 2012-11-19 DIAGNOSIS — I4891 Unspecified atrial fibrillation: Secondary | ICD-10-CM

## 2012-11-23 ENCOUNTER — Ambulatory Visit (INDEPENDENT_AMBULATORY_CARE_PROVIDER_SITE_OTHER): Payer: Medicare Other | Admitting: General Practice

## 2012-11-23 DIAGNOSIS — Z5181 Encounter for therapeutic drug level monitoring: Secondary | ICD-10-CM

## 2012-11-23 DIAGNOSIS — Z7901 Long term (current) use of anticoagulants: Secondary | ICD-10-CM

## 2012-11-23 DIAGNOSIS — I4891 Unspecified atrial fibrillation: Secondary | ICD-10-CM

## 2012-11-23 LAB — POCT INR: INR: 2.1

## 2012-12-07 ENCOUNTER — Encounter: Payer: Self-pay | Admitting: Family Medicine

## 2012-12-07 ENCOUNTER — Ambulatory Visit (INDEPENDENT_AMBULATORY_CARE_PROVIDER_SITE_OTHER): Payer: Medicare Other | Admitting: Family Medicine

## 2012-12-07 VITALS — BP 124/58 | HR 92 | Temp 98.0°F | Wt 135.2 lb

## 2012-12-07 DIAGNOSIS — E119 Type 2 diabetes mellitus without complications: Secondary | ICD-10-CM

## 2012-12-07 DIAGNOSIS — M353 Polymyalgia rheumatica: Secondary | ICD-10-CM

## 2012-12-07 DIAGNOSIS — L989 Disorder of the skin and subcutaneous tissue, unspecified: Secondary | ICD-10-CM

## 2012-12-07 NOTE — Assessment & Plan Note (Signed)
Much improved, continue slow taper. D/w pt. She agrees. If another flare, she'll notify me.

## 2012-12-07 NOTE — Progress Notes (Signed)
PMR.  She has done well. She is down to 2 mg of pred a day, w/o ADE.  She has been digging ditches (!) at home recently w/o muscle pain.  "I can do a man's work.  I don't sit around."    H/o pubic ramus fracture.  The pain is resolved.  See above.    DM2.  On pred as above. Has been ~120 on home checks w/o lows.  She is due for A1c. No meds for DM2 currently.  She had low on meds prev.    Lesions on R side of face.  They were long standing but more irritated recently.  She has a new lesion inferiorly on the R cheek.   Meds, vitals, and allergies reviewed.   ROS: See HPI.  Otherwise, noncontributory.  nad ncat Several ~1cm lesions that appear to SKs on the R side of the face, but the most inferior appear more irritated.  Mmm IRR, not tachy Murmur noted abd soft ctab Ext w/o edema

## 2012-12-07 NOTE — Assessment & Plan Note (Signed)
Recheck A1c today.  She agrees. Continue pred taper.

## 2012-12-07 NOTE — Assessment & Plan Note (Signed)
Refer to derm. She agrees.

## 2012-12-07 NOTE — Patient Instructions (Signed)
See Shirlee Limerick about your referral before you leave today. Get your A1c test done with your next coumadin check.  Take care. If the aches come back, then let me know.

## 2012-12-14 ENCOUNTER — Encounter: Payer: Self-pay | Admitting: General Practice

## 2012-12-14 ENCOUNTER — Ambulatory Visit (INDEPENDENT_AMBULATORY_CARE_PROVIDER_SITE_OTHER): Payer: Medicare Other | Admitting: General Practice

## 2012-12-14 DIAGNOSIS — Z5181 Encounter for therapeutic drug level monitoring: Secondary | ICD-10-CM

## 2012-12-14 DIAGNOSIS — Z7901 Long term (current) use of anticoagulants: Secondary | ICD-10-CM

## 2012-12-14 DIAGNOSIS — I4891 Unspecified atrial fibrillation: Secondary | ICD-10-CM

## 2013-01-07 ENCOUNTER — Other Ambulatory Visit: Payer: Self-pay | Admitting: *Deleted

## 2013-01-07 DIAGNOSIS — E039 Hypothyroidism, unspecified: Secondary | ICD-10-CM

## 2013-01-07 MED ORDER — WARFARIN SODIUM 5 MG PO TABS: ORAL_TABLET | ORAL | Status: DC

## 2013-01-07 MED ORDER — LEVOXYL 88 MCG PO TABS: ORAL_TABLET | ORAL | Status: DC

## 2013-01-07 NOTE — Telephone Encounter (Signed)
She is also due for A1c, future order is in.  Do that with the TSH and schedule her for DM2 check a few days after the labs.  Thanks.

## 2013-01-07 NOTE — Telephone Encounter (Signed)
Pt has not has a tsh done in over a year. She has an appt for protime on 5/19@ 1:30. Would you like her to have labs drawn at that time? Is it ok to refill levoxyl?

## 2013-01-08 NOTE — Telephone Encounter (Signed)
Left detailed message on voicemail.  Appointment scheduled for lab.  Asked the lab to remind her to schedule with Dr. Para March in a few days after labs are done.

## 2013-01-11 ENCOUNTER — Ambulatory Visit (INDEPENDENT_AMBULATORY_CARE_PROVIDER_SITE_OTHER): Payer: Medicare Other | Admitting: General Practice

## 2013-01-11 ENCOUNTER — Ambulatory Visit (INDEPENDENT_AMBULATORY_CARE_PROVIDER_SITE_OTHER): Payer: Medicare Other | Admitting: Family Medicine

## 2013-01-11 ENCOUNTER — Ambulatory Visit: Payer: Medicare Other | Admitting: Family Medicine

## 2013-01-11 ENCOUNTER — Encounter: Payer: Self-pay | Admitting: Family Medicine

## 2013-01-11 ENCOUNTER — Other Ambulatory Visit (INDEPENDENT_AMBULATORY_CARE_PROVIDER_SITE_OTHER): Payer: Medicare Other

## 2013-01-11 VITALS — BP 120/60 | HR 66 | Temp 97.5°F | Ht 64.0 in | Wt 131.8 lb

## 2013-01-11 DIAGNOSIS — H60399 Other infective otitis externa, unspecified ear: Secondary | ICD-10-CM

## 2013-01-11 DIAGNOSIS — Z7901 Long term (current) use of anticoagulants: Secondary | ICD-10-CM

## 2013-01-11 DIAGNOSIS — E119 Type 2 diabetes mellitus without complications: Secondary | ICD-10-CM

## 2013-01-11 DIAGNOSIS — E039 Hypothyroidism, unspecified: Secondary | ICD-10-CM

## 2013-01-11 DIAGNOSIS — H6122 Impacted cerumen, left ear: Secondary | ICD-10-CM

## 2013-01-11 DIAGNOSIS — Z5181 Encounter for therapeutic drug level monitoring: Secondary | ICD-10-CM

## 2013-01-11 DIAGNOSIS — H60392 Other infective otitis externa, left ear: Secondary | ICD-10-CM

## 2013-01-11 DIAGNOSIS — I4891 Unspecified atrial fibrillation: Secondary | ICD-10-CM

## 2013-01-11 DIAGNOSIS — H6120 Impacted cerumen, unspecified ear: Secondary | ICD-10-CM

## 2013-01-11 LAB — POCT INR: INR: 3.9

## 2013-01-11 MED ORDER — CIPROFLOXACIN-DEXAMETHASONE 0.3-0.1 % OT SUSP: 4.0000 [drp] | Freq: Two times a day (BID) | OTIC | Status: DC

## 2013-01-12 ENCOUNTER — Encounter: Payer: Self-pay | Admitting: *Deleted

## 2013-01-12 NOTE — Progress Notes (Signed)
Nature conservation officer at [facility] [address] [place] [address] Phone: [phone] Fax: [phone]  Date:  01/11/2013   Name:  Christine Stone   DOB:  [birth year]   MRN:  [id number] Gender: female Age: 79 y.o.  Primary Physician:  Crawford Givens, MD  Evaluating MD: Hannah Beat, MD  Chief Complaint: Otalgia   History of Present Illness:  Christine Stone is a 79 y.o. very pleasant female patient who presents with the following:  Pleasant elderly woman with L sided ear pain over the last few days. Hurts to touch. No fever or URI sx.  Past Medical History, Surgical History, Social History, Family History, Problem List, Medications, and Allergies have been reviewed and updated if relevant.  Current Outpatient Prescriptions on File Prior to Visit  Medication Sig Dispense Refill  . cholecalciferol (VITAMIN D) 1000 UNITS tablet Take 2 tablets by mouth daily      . lactulose (CEPHULAC) 10 G packet Take 10 g by mouth 3 (three) times daily.      Marland Kitchen LEVOXYL 88 MCG tablet Take one tablet by mouth every day.  30 tablet  12  . predniSONE (DELTASONE) 1 MG tablet Take 2 tablets (2 mg total) by mouth daily. For 30 days, then change to 1 mg a day  60 tablet  0  . predniSONE (DELTASONE) 1 MG tablet Take 1 tablet (1 mg total) by mouth daily. For 30 days, then stop  30 tablet  0  . PROAIR HFA 108 (90 BASE) MCG/ACT inhaler INHALE TWO PUFFS EVERY 4 TO 6 HOURS AS NEEDED  9 g  5  . senna (SENOKOT) 8.6 MG tablet Take 1 tablet by mouth 2 (two) times daily.      . traMADol (ULTRAM) 50 MG tablet Take 1 tablet (50 mg total) by mouth 2 (two) times daily as needed for pain.  30 tablet  0  . valsartan (DIOVAN) 80 MG tablet Take 1 tablet (80 mg total) by mouth daily.  30 tablet  6  . verapamil (CALAN) 80 MG tablet Take 1 tablet (80 mg total) by mouth daily.  30 tablet  6  . warfarin (COUMADIN) 2 MG tablet Take one tablet daily or as directed  30 tablet  1  . warfarin (COUMADIN) 5 MG tablet USE AS  DIRECTED  60 tablet  5   No current facility-administered medications on file prior to visit.    Review of Systems: ROS: GEN: Acute illness details above GI: Tolerating PO intake GU: maintaining adequate hydration and urination Pulm: No SOB Interactive and getting along well at home.  Otherwise, ROS is as per the HPI.   Physical Examination: BP 120/60  Pulse 66  Temp(Src) 97.5 F (36.4 C) (Oral)  Ht 5\' 4"  (1.626 m)  Wt 131 lb 12 oz (59.761 kg)  BMI 22.6 kg/m2  SpO2 96%   GEN: WDWN, NAD, Non-toxic, Alert & Oriented x 3 HEENT: Atraumatic, Normocephalic. R TM clear. L TM not seen. Cerumen impaction. Post-clearing, cerumen persists, canal swollen and tragus ttp. Ears and Nose: No external deformity. EXTR: No clubbing/cyanosis/edema NEURO: Normal gait.  PSYCH: Normally interactive. Conversant. Not depressed or anxious appearing.  Calm demeanor.    Assessment and Plan:  Otitis, externa, infective, left  Cerumen impaction, left  Classic oe  Orders Today:  No orders of the defined types were placed in this encounter.    Updated Medication List: (Includes new medications, updates to list, dose adjustments) Meds ordered this encounter  Medications  . ciprofloxacin-dexamethasone (CIPRODEX) otic suspension    Sig: Place 4 drops into the left ear 2 (two) times daily.    Dispense:  7.5 mL    Refill:  0    Medications Discontinued: There are no discontinued medications.    Signed, Elpidio Galea. Sherrika Weakland, MD 01/12/2013 10:25 AM

## 2013-01-25 ENCOUNTER — Ambulatory Visit (INDEPENDENT_AMBULATORY_CARE_PROVIDER_SITE_OTHER): Payer: Medicare Other | Admitting: General Practice

## 2013-01-25 DIAGNOSIS — Z7901 Long term (current) use of anticoagulants: Secondary | ICD-10-CM

## 2013-01-25 DIAGNOSIS — I4891 Unspecified atrial fibrillation: Secondary | ICD-10-CM

## 2013-01-25 DIAGNOSIS — Z5181 Encounter for therapeutic drug level monitoring: Secondary | ICD-10-CM

## 2013-01-25 LAB — POCT INR: INR: 2.3

## 2013-02-04 ENCOUNTER — Telehealth: Payer: Self-pay | Admitting: Family Medicine

## 2013-02-04 DIAGNOSIS — I714 Abdominal aortic aneurysm, without rupture: Secondary | ICD-10-CM

## 2013-02-04 NOTE — Telephone Encounter (Signed)
Please call pt.  Due for f/u AAA check- prev noted, didn't need intervention last year but needs f/u this year to check the size.  Order is in.  Thanks.

## 2013-02-05 NOTE — Telephone Encounter (Signed)
Wed August 6th at 9:30 at [facility]. MK

## 2013-02-22 ENCOUNTER — Ambulatory Visit (INDEPENDENT_AMBULATORY_CARE_PROVIDER_SITE_OTHER): Payer: Medicare Other | Admitting: Family Medicine

## 2013-02-22 DIAGNOSIS — I4891 Unspecified atrial fibrillation: Secondary | ICD-10-CM

## 2013-02-22 DIAGNOSIS — Z5181 Encounter for therapeutic drug level monitoring: Secondary | ICD-10-CM

## 2013-02-22 DIAGNOSIS — Z7901 Long term (current) use of anticoagulants: Secondary | ICD-10-CM

## 2013-02-22 LAB — POCT INR: INR: 2.6

## 2013-03-04 ENCOUNTER — Ambulatory Visit (INDEPENDENT_AMBULATORY_CARE_PROVIDER_SITE_OTHER): Payer: Medicare Other | Admitting: Family Medicine

## 2013-03-04 ENCOUNTER — Other Ambulatory Visit: Payer: Self-pay

## 2013-03-04 ENCOUNTER — Encounter: Payer: Self-pay | Admitting: Family Medicine

## 2013-03-04 ENCOUNTER — Ambulatory Visit (INDEPENDENT_AMBULATORY_CARE_PROVIDER_SITE_OTHER)
Admission: RE | Admit: 2013-03-04 | Discharge: 2013-03-04 | Disposition: A | Discharge status: Home / Self Care | Payer: Medicare Other | Source: Ambulatory Visit | Attending: Family Medicine | Admitting: Family Medicine

## 2013-03-04 VITALS — BP 130/64 | HR 92 | Temp 98.5°F | Wt 135.5 lb

## 2013-03-04 DIAGNOSIS — R0602 Shortness of breath: Secondary | ICD-10-CM | POA: Insufficient documentation

## 2013-03-04 MED ORDER — VALSARTAN 80 MG PO TABS: 40.0000 mg | ORAL_TABLET | Freq: Every day | ORAL | Status: DC

## 2013-03-04 NOTE — Patient Instructions (Addendum)
We'll contact you with your lab report. If you get profoundly short of breath, then go to the ER.  Call back with an update tomorrow.   We may need to get you back to see the cardiology clinic.  Cut the diovan in half for now (take 40mg ).

## 2013-03-04 NOTE — Assessment & Plan Note (Signed)
Episodic, nontoxic.  CXR w/o acute changes. She doesn't look fluid overloaded.  EKG w/o sig change. AF noted.  Would check basic labs today and cut ARB to 50% of prev dose and have her report back.  She agrees.  If worsening, to ER.  She agrees.  No clear cause for sx seen yet.  >25 min spent with face to face with patient, >50% counseling and/or coordinating care

## 2013-03-04 NOTE — Progress Notes (Signed)
H/o CHF with diastolic dysfunction, AF, mild AS on last echo, PMR (treated with prednisone did well with return to baseline activity prev).  In April 2014 she had been digging ditches (!) at home w/o muscle pain.  As discussed at prev visit, "I can do a man's work. I don't sit around."  Since then she has had mult episodes over the last few weeks, increasing in frequency recently.  Was intermittent, now more frequent.   No CP.  She'll get SOB and lightheaded with exertion and with position changes.  She is avoiding going out in the heat.  No swelling.  She is off pred taper and doesn't have the prev PMR pain.    She has baseline bruising with coumadin.  No fevers documented, she may have felt hot prev during the week.  Some clear sputum/chest congestion.  She has seasonal allergies.    She continues in AF but w/o syncope.  She is on her baseline meds o/w.    PMH and SH reviewed  ROS: See HPI, otherwise noncontributory.  Meds, vitals, and allergies reviewed.   nad ncat Mmm, OP wnl Neck supple, no LA, no stridor but occ UAN with expiration IRR, murmur noted as expected ctab except for faint UAN noted, this doesn't appear to originate in the chest itself, no focal dec in BS abd soft Ext w/o edema

## 2013-03-05 LAB — CBC WITH DIFFERENTIAL/PLATELET
Basophils Absolute: 0 10*3/uL (ref 0.0–0.1)
Eosinophils Absolute: 0.3 10*3/uL (ref 0.0–0.7)
Lymphocytes Relative: 26.5 % (ref 12.0–46.0)
MCHC: 33 g/dL (ref 30.0–36.0)
Monocytes Relative: 10.7 % (ref 3.0–12.0)
Neutrophils Relative %: 58.1 % (ref 43.0–77.0)
RDW: 13.6 % (ref 11.5–14.6)

## 2013-03-05 LAB — COMPREHENSIVE METABOLIC PANEL
ALT: 10 U/L (ref 0–35)
AST: 22 U/L (ref 0–37)
Albumin: 3.8 g/dL (ref 3.5–5.2)
BUN: 28 mg/dL — ABNORMAL HIGH (ref 6–23)
CO2: 29 mEq/L (ref 19–32)
Calcium: 9.5 mg/dL (ref 8.4–10.5)
Chloride: 105 mEq/L (ref 96–112)
Creatinine, Ser: 1.9 mg/dL — ABNORMAL HIGH (ref 0.4–1.2)
GFR: 26.56 mL/min — ABNORMAL LOW (ref >60.00)
Potassium: 4.8 mEq/L (ref 3.5–5.1)

## 2013-03-05 LAB — BRAIN NATRIURETIC PEPTIDE: Pro B Natriuretic peptide (BNP): 424 pg/mL — ABNORMAL HIGH (ref 0.0–100.0)

## 2013-03-05 LAB — TROPONIN I: Troponin I: 0.01 ng/mL (ref <0.06)

## 2013-03-11 ENCOUNTER — Ambulatory Visit (INDEPENDENT_AMBULATORY_CARE_PROVIDER_SITE_OTHER): Payer: Medicare Other | Admitting: Family Medicine

## 2013-03-11 ENCOUNTER — Encounter: Payer: Self-pay | Admitting: Family Medicine

## 2013-03-11 VITALS — BP 112/72 | HR 82 | Temp 97.7°F | Wt 134.8 lb

## 2013-03-11 DIAGNOSIS — Z789 Other specified health status: Secondary | ICD-10-CM

## 2013-03-11 DIAGNOSIS — I10 Essential (primary) hypertension: Secondary | ICD-10-CM

## 2013-03-11 DIAGNOSIS — R0602 Shortness of breath: Secondary | ICD-10-CM

## 2013-03-11 LAB — BASIC METABOLIC PANEL
CO2: 28 mEq/L (ref 19–32)
Chloride: 103 mEq/L (ref 96–112)
Potassium: 4.6 mEq/L (ref 3.5–5.1)
Sodium: 138 mEq/L (ref 135–145)

## 2013-03-11 NOTE — Patient Instructions (Addendum)
Stop the diovan for now.  Go to the lab on the way out.  We'll contact you with your lab report.  Call me with an update on Monday or Tuesday.  Try to check your BP out of the clinic (in a few days). Take care.

## 2013-03-12 ENCOUNTER — Encounter: Payer: Self-pay | Admitting: Family Medicine

## 2013-03-12 DIAGNOSIS — Z789 Other specified health status: Secondary | ICD-10-CM | POA: Insufficient documentation

## 2013-03-12 NOTE — Progress Notes (Signed)
Prev felt weak. Cr elevated and ARB cut 50%.  In interval, less overall weakness, less SOB. Feels some better, not back to baseline yet.  No new sx. No FCNAVD.   Meds, vitals, and allergies reviewed.   ROS: See HPI.  Otherwise, noncontributory.  nad  ncat  Mmm, OP wnl  Neck supple, no LA IRR, murmur noted as expected  ctab  abd soft  Ext w/o edema

## 2013-03-12 NOTE — Assessment & Plan Note (Signed)
Some better, will stop ARB. See notes on labs. Will recheck next week. This may all be related to relative hypotension and stopping the ARB should help. Okay for outpatient f/u.  CP free.

## 2013-03-16 ENCOUNTER — Telehealth: Payer: Self-pay

## 2013-03-16 NOTE — Telephone Encounter (Signed)
Pt called to let Dr Para March know pt feels better; not as light headed and weak; pts BP today 134/60.

## 2013-03-17 NOTE — Telephone Encounter (Signed)
Good, thanks, I'll see her on the 28th.  Continue as is for now.

## 2013-03-17 NOTE — Telephone Encounter (Signed)
Patient notified as instructed by telephone. 

## 2013-03-17 NOTE — Telephone Encounter (Signed)
Left message on machine to call back  

## 2013-03-22 ENCOUNTER — Ambulatory Visit: Payer: Medicare Other | Admitting: Family Medicine

## 2013-03-22 ENCOUNTER — Ambulatory Visit: Payer: Medicare Other

## 2013-03-25 ENCOUNTER — Ambulatory Visit (INDEPENDENT_AMBULATORY_CARE_PROVIDER_SITE_OTHER): Payer: Medicare Other | Admitting: Family Medicine

## 2013-03-25 ENCOUNTER — Ambulatory Visit: Payer: Medicare Other

## 2013-03-25 ENCOUNTER — Encounter: Payer: Self-pay | Admitting: Family Medicine

## 2013-03-25 VITALS — BP 112/52 | HR 63 | Temp 97.9°F | Wt 134.8 lb

## 2013-03-25 DIAGNOSIS — I4891 Unspecified atrial fibrillation: Secondary | ICD-10-CM

## 2013-03-25 DIAGNOSIS — Z7901 Long term (current) use of anticoagulants: Secondary | ICD-10-CM

## 2013-03-25 DIAGNOSIS — Z5181 Encounter for therapeutic drug level monitoring: Secondary | ICD-10-CM

## 2013-03-25 DIAGNOSIS — R7989 Other specified abnormal findings of blood chemistry: Secondary | ICD-10-CM

## 2013-03-25 DIAGNOSIS — I10 Essential (primary) hypertension: Secondary | ICD-10-CM

## 2013-03-25 DIAGNOSIS — R799 Abnormal finding of blood chemistry, unspecified: Secondary | ICD-10-CM

## 2013-03-25 NOTE — Progress Notes (Signed)
Her knees and shoulders are sore.  This doesn't feel like the PMR from previous.  No clear cause for the changes. Her sleep is disrupted, hasn't been good recently.  It is possible that her sleeping posture may be causing some of the pain.  She is waking with some shoulder pain B.  She doesn't want to take meds for sleep or much for the pain.  She occ takes some tylenol and that helps some.  No nsaids.    HTN: She has cut the ARB out totally. Now of prednisone and no "weak" spells.  No CP, not SOB.  She feels better in the interval.  Due for recheck BMET due to inc in CR recently.  D/w pt.    Meds, vitals, and allergies reviewed.   ROS: See HPI.  Otherwise, noncontributory.  nad ncat Mmm IRR. Murmur noted ctab abd soft Ext w/o edema

## 2013-03-25 NOTE — Patient Instructions (Addendum)
Go to the lab on the way out.  We'll contact you with your lab report.  We needed to recheck your kidney function and BP today.  You don't need to be on the diovan now.  I think coming off that medicine will help your kidney function.  Check your BP episodically.  If you have swelling or other concerns, then let me know.    Take two tylenol for the aches and see if that helps.

## 2013-03-26 LAB — BASIC METABOLIC PANEL
CO2: 29 mEq/L (ref 19–32)
Chloride: 103 mEq/L (ref 96–112)
Glucose, Bld: 94 mg/dL (ref 70–99)
Potassium: 4.6 mEq/L (ref 3.5–5.1)
Sodium: 140 mEq/L (ref 135–145)

## 2013-03-26 NOTE — Assessment & Plan Note (Signed)
She may not need the BP med at all after coming of prednisone.  See notes on BMET from today. Avoid nsaids.  D/w pt in detail.  She'll use tylenol prn for pain, likely not from [facility].  >25 min spent with face to face with patient, >50% counseling and/or coordinating care.

## 2013-03-29 ENCOUNTER — Other Ambulatory Visit: Payer: Self-pay | Admitting: Family Medicine

## 2013-03-29 DIAGNOSIS — N184 Chronic kidney disease, stage 4 (severe): Secondary | ICD-10-CM

## 2013-03-31 ENCOUNTER — Encounter (INDEPENDENT_AMBULATORY_CARE_PROVIDER_SITE_OTHER): Payer: Medicare Other

## 2013-03-31 DIAGNOSIS — I714 Abdominal aortic aneurysm, without rupture, unspecified: Secondary | ICD-10-CM

## 2013-04-22 ENCOUNTER — Ambulatory Visit (INDEPENDENT_AMBULATORY_CARE_PROVIDER_SITE_OTHER): Payer: Medicare Other | Admitting: Family Medicine

## 2013-04-22 ENCOUNTER — Other Ambulatory Visit: Payer: Self-pay | Admitting: Family Medicine

## 2013-04-22 ENCOUNTER — Ambulatory Visit: Payer: Medicare Other

## 2013-04-22 DIAGNOSIS — Z7901 Long term (current) use of anticoagulants: Secondary | ICD-10-CM

## 2013-04-22 DIAGNOSIS — I4891 Unspecified atrial fibrillation: Secondary | ICD-10-CM

## 2013-04-22 DIAGNOSIS — Z5181 Encounter for therapeutic drug level monitoring: Secondary | ICD-10-CM

## 2013-04-22 MED ORDER — WARFARIN SODIUM 2 MG PO TABS: ORAL_TABLET | ORAL | Status: DC

## 2013-04-23 ENCOUNTER — Encounter: Payer: Self-pay | Admitting: Nephrology

## 2013-05-12 ENCOUNTER — Encounter: Payer: Self-pay | Admitting: Family Medicine

## 2013-05-17 ENCOUNTER — Ambulatory Visit (INDEPENDENT_AMBULATORY_CARE_PROVIDER_SITE_OTHER): Payer: Medicare Other | Admitting: Family Medicine

## 2013-05-17 ENCOUNTER — Encounter: Payer: Self-pay | Admitting: Family Medicine

## 2013-05-17 ENCOUNTER — Ambulatory Visit: Payer: Medicare Other | Admitting: Internal Medicine

## 2013-05-17 VITALS — BP 120/62 | HR 89 | Temp 98.3°F | Wt 135.5 lb

## 2013-05-17 DIAGNOSIS — I4891 Unspecified atrial fibrillation: Secondary | ICD-10-CM

## 2013-05-17 DIAGNOSIS — R319 Hematuria, unspecified: Secondary | ICD-10-CM

## 2013-05-17 DIAGNOSIS — R300 Dysuria: Secondary | ICD-10-CM

## 2013-05-17 LAB — POCT UA - MICROSCOPIC ONLY

## 2013-05-17 LAB — POCT URINALYSIS DIPSTICK
Ketones, UA: NEGATIVE
Urobilinogen, UA: 0.2
pH, UA: 6

## 2013-05-17 MED ORDER — CIPROFLOXACIN HCL 250 MG PO TABS: 250.0000 mg | ORAL_TABLET | Freq: Two times a day (BID) | ORAL | Status: DC

## 2013-05-17 NOTE — Progress Notes (Signed)
Nature conservation officer at [facility] [address] [place] [address] Phone: [phone] Fax: [phone]  Date:  05/17/2013   Name:  Christine Stone   DOB:  1925-11-18   MRN:  [id number] Gender: female Age: 79 y.o.  Primary Physician:  Crawford Givens, MD  Evaluating MD: Hannah Beat, MD   Chief Complaint: Urinary Tract Infection   History of Present Illness:  Christine Stone is a 79 y.o. pleasant patient who presents with the following:  Felt like a couple of days ago, thought might have pressure on her bladder. Last night had bathroom and had pure blood.   Elderly patient with chronic kidney disease stage IV, and chronically anticoagulated on Coumadin for atrial fibrillation presents with dysuria. She also has had some hematuria yesterday which is discontinued currently. She had some sensation as if she was burning and needed to go to the bathroom more normal.  Patient Active Problem List   Diagnosis Date Noted  . Living will on file at physician's office 03/12/2013  . SOB (shortness of breath) 03/04/2013  . Skin lesion 12/07/2012  . Back pain 09/24/2012  . Pubic ramus fracture 08/06/2012  . Hematuria 07/03/2012  . Feeling weak 04/12/2012  . PMR (polymyalgia rheumatica) 02/11/2012  . AAA (abdominal aortic aneurysm) 01/12/2012  . HYPERTENSION, BENIGN 02/21/2010  . AORTIC STENOSIS 02/24/2009  . EMPHYSEMA 08/15/2008  . VASOVAGAL SYNCOPE 05/31/2008  . Chronic kidney disease, stage IV (severe) 02/19/2008  . NEOPLASM UNCERTAIN BEHAVIOR PLEU THYMUS&MEDIAST 09/21/2007  . PULMONARY NODULE 09/21/2007  . ALLERGIC RHINITIS 02/25/2007  . HYPOTHYROIDISM 02/07/2007  . HYPERLIPIDEMIA 02/07/2007  . DIABETES MELLITUS, TYPE II 02/06/2007  . ATRIAL FIBRILLATION -CHRONIC 02/06/2007  . CONGESTIVE HEART FAILURE 02/06/2007  . POSTMENOPAUSAL BLEEDING 02/06/2007    Past Medical History  Diagnosis Date  . Atrial fibrillation     On coumadin   . CHF (congestive heart failure)      Diastolic -- echo 02/72 EF 60%moderate diastolic dysfunction mild AS   . Hyperlipidemia   . Hypothyroidism   . Diabetes mellitus     Type II   . GERD (gastroesophageal reflux disease)     Sleep study   . Chest pain     R/Od A. fib   . Syncope 05-22-08 - 05/23/08    [facility] syncope R/o'd ARI  . PMR (polymyalgia rheumatica)   . Fracture of multiple pubic rami     [facility] 2013    Past Surgical History  Procedure Laterality Date  . Appendectomy  1945  . Partial hysterectomy      Ovaries intact   . Pleural scarification      Pleural Fluid B9    History   Social History  . Marital Status: Married    Spouse Name: N/A    Number of Children: N/A  . Years of Education: N/A   Occupational History  . Not on file.   Social History Main Topics  . Smoking status: Former Smoker -- 1.00 packs/day for 20 years    Types: Cigarettes    Quit date: 08/27/1990  . Smokeless tobacco: Never Used     Comment: 1/2 ppd x 40 years quit 1988.   Marland Kitchen Alcohol Use: No  . Drug Use: No  . Sexual Activity: Not on file   Other Topics Concern  . Not on file   Social History Narrative   Retired from [employer]   Married 1945    Family History  Problem Relation Age  of Onset  . Thrombosis Father 48    thrombosis of heart   . Coronary artery disease Father   . Cancer Sister   . Heart failure Sister   . Hypertension Sister   . Cancer Brother     kidney    Allergies  Allergen Reactions  . Loratadine     REACTION: makes head feel funny  . Other     Allergic to bandaid adhesive- local recation  . Sulfasalazine   . Tape     Local reaction to adhesive.      Medication list has been reviewed and updated.  Outpatient Prescriptions Prior to Visit  Medication Sig Dispense Refill  . cholecalciferol (VITAMIN D) 1000 UNITS tablet Take 2 tablets by mouth daily      . LEVOXYL 88 MCG tablet Take one tablet by mouth every day.  30 tablet  12  . PROAIR HFA 108 (90 BASE) MCG/ACT  inhaler INHALE TWO PUFFS EVERY 4 TO 6 HOURS AS NEEDED  9 g  5  . senna (SENOKOT) 8.6 MG tablet Take 1 tablet by mouth 2 (two) times daily.      . verapamil (CALAN) 80 MG tablet Take 1 tablet (80 mg total) by mouth daily.  30 tablet  6  . warfarin (COUMADIN) 2 MG tablet Take one tablet daily or as directed  30 tablet  2  . warfarin (COUMADIN) 5 MG tablet USE AS DIRECTED  60 tablet  5  . lactulose (CEPHULAC) 10 G packet Take 10 g by mouth 3 (three) times daily.      . traMADol (ULTRAM) 50 MG tablet Take 1 tablet (50 mg total) by mouth 2 (two) times daily as needed for pain.  30 tablet  0   No facility-administered medications prior to visit.    Review of Systems:  ROS: GEN: Acute illness details above GI: Tolerating PO intake GU: maintaining adequate hydration and urination Pulm: No SOB Interactive and getting along well at home.  Otherwise, ROS is as per the HPI.   Physical Examination: BP 120/62  Pulse 89  Temp(Src) 98.3 F (36.8 C) (Oral)  Wt 135 lb 8 oz (61.462 kg)  BMI 23.25 kg/m2  SpO2 96%  Ideal Body Weight:     GEN: WDWN, A&Ox4,NAD. Non-toxic HEENT: Atraumatc, normocephalic. CV: RRR, No M/G/R PULM: CTA B, No wheezes, crackles, or rhonchi ABD: S, NT, ND, +BS, no rebound. No CVAT. No suprapubic tenderness. EXT: No c/c/e   Assessment and Plan:  Hematuria - Plan: POCT Urinalysis Dipstick, POCT UA - Microscopic Only, Urine culture, INR, POCT INR  Dysuria - Plan: Urine culture  ATRIAL FIBRILLATION -CHRONIC - Plan: INR, POCT INR  Probable UTI. Check culture. Check INR.  Reviewed notes from nephrology and recent GFR. Adequate for Cipro 250 twice a day dosing.  Results for orders placed in visit on 05/17/13  POCT URINALYSIS DIPSTICK      Result Value Range   Color, UA yellow     Clarity, UA clear     Glucose, UA negative     Bilirubin, UA small     Ketones, UA negative     Spec Grav, UA 1.020     Blood, UA large     pH, UA 6.0     Protein, UA 1+      Urobilinogen, UA 0.2     Nitrite, UA negative     Leukocytes, UA moderate (2+)    POCT UA - MICROSCOPIC ONLY  Result Value Range   WBC, Ur, HPF, POC 20+     RBC, urine, microscopic 20+     Bacteria, U Microscopic 3+     Mucus, UA       Epithelial cells, urine per micros 1-5     Crystals, Ur, HPF, POC       Casts, Ur, LPF, POC       Yeast, UA      POCT INR      Result Value Range   INR 2.2       Orders Today:  Orders Placed This Encounter  Procedures  . Urine culture  . POCT Urinalysis Dipstick  . POCT UA - Microscopic Only  . INR  . POCT INR    Updated Medication List: (Includes new medications, updates to list, dose adjustments) Meds ordered this encounter  Medications  . ciprofloxacin (CIPRO) 250 MG tablet    Sig: Take 1 tablet (250 mg total) by mouth 2 (two) times daily.    Dispense:  14 tablet    Refill:  0    Medications Discontinued: There are no discontinued medications.    Signed, Elpidio Galea. Sanuel Ladnier, MD 05/17/2013 1:05 PM

## 2013-05-20 ENCOUNTER — Ambulatory Visit (INDEPENDENT_AMBULATORY_CARE_PROVIDER_SITE_OTHER): Payer: Medicare Other

## 2013-05-20 ENCOUNTER — Ambulatory Visit: Payer: Medicare Other | Admitting: Family Medicine

## 2013-05-20 DIAGNOSIS — I4891 Unspecified atrial fibrillation: Secondary | ICD-10-CM

## 2013-05-20 DIAGNOSIS — Z23 Encounter for immunization: Secondary | ICD-10-CM

## 2013-05-20 LAB — URINE CULTURE

## 2013-06-17 ENCOUNTER — Ambulatory Visit (INDEPENDENT_AMBULATORY_CARE_PROVIDER_SITE_OTHER): Payer: Medicare Other | Admitting: Family Medicine

## 2013-06-17 ENCOUNTER — Ambulatory Visit: Payer: Medicare Other

## 2013-06-17 DIAGNOSIS — I4891 Unspecified atrial fibrillation: Secondary | ICD-10-CM

## 2013-06-17 DIAGNOSIS — Z7901 Long term (current) use of anticoagulants: Secondary | ICD-10-CM

## 2013-06-17 DIAGNOSIS — Z5181 Encounter for therapeutic drug level monitoring: Secondary | ICD-10-CM

## 2013-06-17 LAB — POCT INR: INR: 2.5

## 2013-06-25 ENCOUNTER — Emergency Department: Payer: Self-pay | Admitting: Emergency Medicine

## 2013-06-25 LAB — URINALYSIS, COMPLETE
Glucose,UR: NEGATIVE mg/dL (ref 0–75)
Hyaline Cast: 2
Ketone: NEGATIVE
Protein: 30
RBC,UR: 13 /HPF (ref 0–5)
Specific Gravity: 1.018 (ref 1.003–1.030)
WBC UR: 23 /HPF (ref 0–5)

## 2013-06-25 LAB — COMPREHENSIVE METABOLIC PANEL
Albumin: 3.5 g/dL (ref 3.4–5.0)
Alkaline Phosphatase: 74 U/L (ref 50–136)
Bilirubin,Total: 0.3 mg/dL (ref 0.2–1.0)
Calcium, Total: 9.2 mg/dL (ref 8.5–10.1)
Co2: 29 mmol/L (ref 21–32)
Creatinine: 2.09 mg/dL — ABNORMAL HIGH (ref 0.60–1.30)
Glucose: 104 mg/dL — ABNORMAL HIGH (ref 65–99)
Sodium: 138 mmol/L (ref 136–145)
Total Protein: 7.7 g/dL (ref 6.4–8.2)

## 2013-06-25 LAB — CBC
MCH: 29.1 pg (ref 26.0–34.0)
MCV: 88 fL (ref 80–100)
Platelet: 180 10*3/uL (ref 150–440)
RDW: 14.1 % (ref 11.5–14.5)
WBC: 7.1 10*3/uL (ref 3.6–11.0)

## 2013-06-25 LAB — TROPONIN I: Troponin-I: <0.02 ng/mL

## 2013-06-25 LAB — CK TOTAL AND CKMB (NOT AT ARMC)
CK, Total: 48 U/L (ref 21–215)
CK-MB: 0.6 ng/mL (ref 0.5–3.6)

## 2013-06-28 ENCOUNTER — Telehealth: Payer: Self-pay | Admitting: Family Medicine

## 2013-06-28 NOTE — Telephone Encounter (Signed)
[facility] Triage Call Report Triage Record Num: [id number] Operator: Patriciaann Clan Patient Name: Christine Stone Call Date & Time: 06/25/2013 5:06:43PM Patient Phone: [phone] PCP: Christine Stone Patient Gender: Female PCP Fax : Patient DOB: [birth year] Practice Name: [facility] Reason for Call: Caller: Christine Stone/Patient; PCP: Christine Stone Christine Stone) Christine Stone); CB#: [phone]; Call regarding Blood pressure elevation; Patient states she has hostory of Hypertension. Patient states she experienced an episode of dizziness, onset 06/25/13 0930. States episode lasted approx. 5 minutes and resolved spontaneously. States blood pressure was 173/104 at 0930 06/25/13. Patient states she took her blood pressure medication and blood pressure improved to 156/95 at approx. 1000 06/25/13. Patient states blood pressure elevated to 195/86 at 1630 06/25/13. Patient denies headache, dizziness, chest pain or dyspnea. Denies numbness or tingling. Speech intact. Triage per Hypertension, Diagnosed Protocol. No emergent sx identified. Disposition of " See Provider within 4 hours" obtained related to positive triage assessment for " Systolic blood pressure of more than ." Care advice given per guidelines. Patient advised to change positions slowly, with assistance. Patient advised to be evalauted in the ED now. Patient advised not to drive self. Patient declines to go to [facility]. Patient states she uses [facility] due to location. Patient states she will go to the ED at [facility] for evaluation. Protocol(s) Used: Hypertension, Diagnosed or Suspected Recommended Outcome per Protocol: See Provider within 4 hours Reason for Outcome: Systolic blood pressure of more than 180 mmHg OR diastolic blood pressure of more than 120 mmHg Care Advice: ~ Another adult should drive. Call EMS 911 if new symptoms develop, such as severe shortness of breath, chest pain, change  in mental status, acute neurologic deficit, seizure, visual disturbances, pulse rate > 120 / minute, or very irregular pulse. ~ ~ HEALTH PROMOTION / MAINTENANCE Medication Advice: - Discontinue all nonprescription and alternative medications, especially stimulants, until evaluated by provider. - Take prescribed medications as directed, following label instructions for the medication. - Do not change medications or dosing regimen until provider is consulted. - Know possible side effects of medication and what to do if they occur. - Tell provider all prescription, nonprescription or alternative medications that you take ~ 06/25/2013 5:22:40PM Page 1 of 1 [id number]

## 2013-06-28 NOTE — Telephone Encounter (Signed)
Noted, thanks.  Please make sure she has f/u either here or with renal about her BP.  Thanks.

## 2013-06-28 NOTE — Telephone Encounter (Signed)
Patient advised and says she saw her kidney MD today as advised by ER.

## 2013-06-28 NOTE — Telephone Encounter (Signed)
Please get an update on patient.  Thanks. 

## 2013-06-28 NOTE — Telephone Encounter (Signed)
BP went even higher after she reached the ER.  They kept her until 2:30 am.  They increased one of her medicines that the kidney MD had decreased and she is doing better now.

## 2013-07-15 ENCOUNTER — Other Ambulatory Visit: Payer: Self-pay | Admitting: Family Medicine

## 2013-07-21 ENCOUNTER — Ambulatory Visit (INDEPENDENT_AMBULATORY_CARE_PROVIDER_SITE_OTHER): Payer: Medicare Other | Admitting: Family Medicine

## 2013-07-21 DIAGNOSIS — I4891 Unspecified atrial fibrillation: Secondary | ICD-10-CM

## 2013-07-21 DIAGNOSIS — Z7901 Long term (current) use of anticoagulants: Secondary | ICD-10-CM

## 2013-07-21 DIAGNOSIS — Z5181 Encounter for therapeutic drug level monitoring: Secondary | ICD-10-CM

## 2013-07-21 LAB — POCT INR: INR: 1.7

## 2013-08-24 ENCOUNTER — Ambulatory Visit: Payer: Medicare Other

## 2013-08-24 ENCOUNTER — Inpatient Hospital Stay: Payer: Self-pay | Admitting: Specialist

## 2013-08-24 LAB — RAPID INFLUENZA A&B ANTIGENS

## 2013-08-24 LAB — COMPREHENSIVE METABOLIC PANEL
Alkaline Phosphatase: 60 U/L
Anion Gap: 7 (ref 7–16)
BUN: 24 mg/dL — ABNORMAL HIGH (ref 7–18)
Bilirubin,Total: 0.4 mg/dL (ref 0.2–1.0)
Calcium, Total: 8.9 mg/dL (ref 8.5–10.1)
EGFR (African American): 39 — ABNORMAL LOW
Potassium: 3.4 mmol/L — ABNORMAL LOW (ref 3.5–5.1)
SGOT(AST): 42 U/L — ABNORMAL HIGH (ref 15–37)
SGPT (ALT): 23 U/L (ref 12–78)
Sodium: 134 mmol/L — ABNORMAL LOW (ref 136–145)

## 2013-08-24 LAB — CBC
HCT: 42.3 % (ref 35.0–47.0)
HGB: 14.4 g/dL (ref 12.0–16.0)
MCHC: 34.2 g/dL (ref 32.0–36.0)
MCV: 87 fL (ref 80–100)
Platelet: 121 10*3/uL — ABNORMAL LOW (ref 150–440)
RDW: 14.1 % (ref 11.5–14.5)

## 2013-08-24 LAB — CK TOTAL AND CKMB (NOT AT ARMC): CK-MB: 1.8 ng/mL (ref 0.5–3.6)

## 2013-08-24 LAB — TROPONIN I
Troponin-I: 0.06 ng/mL — ABNORMAL HIGH
Troponin-I: 0.05 ng/mL

## 2013-08-24 LAB — PROTIME-INR: INR: 2.2

## 2013-08-25 LAB — CBC WITH DIFFERENTIAL/PLATELET
Basophil %: 0.1 %
HGB: 12.7 g/dL (ref 12.0–16.0)
MCH: 29.3 pg (ref 26.0–34.0)
MCHC: 33.9 g/dL (ref 32.0–36.0)
MCV: 87 fL (ref 80–100)
Monocyte #: 0.2 x10 3/mm (ref 0.2–0.9)
Monocyte %: 9.7 %
Neutrophil #: 1.6 10*3/uL (ref 1.4–6.5)
Neutrophil %: 65.7 %
Platelet: 116 10*3/uL — ABNORMAL LOW (ref 150–440)
RBC: 4.33 10*6/uL (ref 3.80–5.20)
WBC: 2.4 10*3/uL — ABNORMAL LOW (ref 3.6–11.0)

## 2013-08-25 LAB — BASIC METABOLIC PANEL
Chloride: 105 mmol/L (ref 98–107)
EGFR (Non-African Amer.): 40 — ABNORMAL LOW
Glucose: 157 mg/dL — ABNORMAL HIGH (ref 65–99)
Osmolality: 281 (ref 275–301)
Sodium: 137 mmol/L (ref 136–145)

## 2013-08-25 LAB — PRO B NATRIURETIC PEPTIDE: B-Type Natriuretic Peptide: 6420 pg/mL — ABNORMAL HIGH (ref 0–450)

## 2013-08-25 LAB — MAGNESIUM: Magnesium: 2 mg/dL

## 2013-08-25 LAB — TSH: Thyroid Stimulating Horm: 0.13 u[IU]/mL — ABNORMAL LOW

## 2013-08-25 LAB — T4, FREE: Free Thyroxine: 1.64 ng/dL — ABNORMAL HIGH (ref 0.76–1.46)

## 2013-08-25 LAB — PROTIME-INR
INR: 3.4
Prothrombin Time: 33.1 secs — ABNORMAL HIGH (ref 11.5–14.7)

## 2013-08-26 ENCOUNTER — Ambulatory Visit: Payer: Self-pay | Admitting: Nurse Practitioner

## 2013-08-26 LAB — CBC WITH DIFFERENTIAL/PLATELET
Basophil #: 0 10*3/uL (ref 0.0–0.1)
Basophil %: 0 %
Eosinophil #: 0 10*3/uL (ref 0.0–0.7)
Eosinophil %: 0 %
HCT: 37.7 % (ref 35.0–47.0)
HGB: 12.8 g/dL (ref 12.0–16.0)
Lymphocyte #: 0.7 10*3/uL — ABNORMAL LOW (ref 1.0–3.6)
Lymphocyte %: 10.9 %
MCH: 29.2 pg (ref 26.0–34.0)
MCHC: 34 g/dL (ref 32.0–36.0)
MCV: 86 fL (ref 80–100)
Monocyte #: 0.6 x10 3/mm (ref 0.2–0.9)
Monocyte %: 8.5 %
Neutrophil #: 5.4 10*3/uL (ref 1.4–6.5)
Neutrophil %: 80.6 %
Platelet: 118 10*3/uL — ABNORMAL LOW (ref 150–440)
RBC: 4.39 10*6/uL (ref 3.80–5.20)
RDW: 14.1 % (ref 11.5–14.5)
WBC: 6.8 10*3/uL (ref 3.6–11.0)

## 2013-08-26 LAB — BASIC METABOLIC PANEL
Anion Gap: 4 — ABNORMAL LOW (ref 7–16)
BUN: 31 mg/dL — ABNORMAL HIGH (ref 7–18)
Calcium, Total: 8.4 mg/dL — ABNORMAL LOW (ref 8.5–10.1)
Chloride: 106 mmol/L (ref 98–107)
Co2: 29 mmol/L (ref 21–32)
Creatinine: 1.25 mg/dL (ref 0.60–1.30)
EGFR (African American): 45 — ABNORMAL LOW
EGFR (Non-African Amer.): 39 — ABNORMAL LOW
Glucose: 120 mg/dL — ABNORMAL HIGH (ref 65–99)
Osmolality: 285 (ref 275–301)
Potassium: 3.8 mmol/L (ref 3.5–5.1)
Sodium: 139 mmol/L (ref 136–145)

## 2013-08-26 LAB — PROTIME-INR
INR: 4.1
Prothrombin Time: 38.7 secs — ABNORMAL HIGH (ref 11.5–14.7)

## 2013-08-27 DIAGNOSIS — I359 Nonrheumatic aortic valve disorder, unspecified: Secondary | ICD-10-CM

## 2013-08-27 LAB — BASIC METABOLIC PANEL
Anion Gap: 3 — ABNORMAL LOW (ref 7–16)
BUN: 34 mg/dL — AB (ref 7–18)
CALCIUM: 9.5 mg/dL (ref 8.5–10.1)
Chloride: 100 mmol/L (ref 98–107)
Co2: 35 mmol/L — ABNORMAL HIGH (ref 21–32)
Creatinine: 1.3 mg/dL (ref 0.60–1.30)
EGFR (African American): 43 — ABNORMAL LOW
GFR CALC NON AF AMER: 37 — AB
Glucose: 117 mg/dL — ABNORMAL HIGH (ref 65–99)
OSMOLALITY: 284 (ref 275–301)
Potassium: 3.8 mmol/L (ref 3.5–5.1)
SODIUM: 138 mmol/L (ref 136–145)

## 2013-08-27 LAB — PROTIME-INR
INR: 3
Prothrombin Time: 30.2 secs — ABNORMAL HIGH (ref 11.5–14.7)

## 2013-08-28 LAB — PROTIME-INR
INR: 2.9
PROTHROMBIN TIME: 29.4 s — AB (ref 11.5–14.7)

## 2013-08-30 ENCOUNTER — Other Ambulatory Visit: Payer: Self-pay | Admitting: Family Medicine

## 2013-08-31 ENCOUNTER — Telehealth: Payer: Self-pay

## 2013-08-31 NOTE — Telephone Encounter (Signed)
Pt was discharged from [facility] on 08/30/13 with COPD, Acute Bronchitis; pt did not want to go to [facility] to stay so and home health was ordered. Pts son cannot take care of herself and pt cannot stay at home. Ron, pts son wants pt admitted to [facility] and pt is agreeable to go for short term. Ron pts son request FL2 form filled out and faxed to [facility] fax # [phone]. Pt has f/u [facility] appt on 09/07/13 with Webb Silversmith NP. Ron will bring FL2 form by office on 09/01/13. Ron request cb when faxed to [employer].

## 2013-09-01 ENCOUNTER — Telehealth: Payer: Self-pay | Admitting: Family Medicine

## 2013-09-01 ENCOUNTER — Emergency Department: Payer: Self-pay | Admitting: Emergency Medicine

## 2013-09-01 LAB — BASIC METABOLIC PANEL
ANION GAP: 6 — AB (ref 7–16)
BUN: 49 mg/dL — ABNORMAL HIGH (ref 7–18)
CALCIUM: 8.9 mg/dL (ref 8.5–10.1)
Chloride: 100 mmol/L (ref 98–107)
Co2: 30 mmol/L (ref 21–32)
Creatinine: 1.29 mg/dL (ref 0.60–1.30)
EGFR (African American): 44 — ABNORMAL LOW
GFR CALC NON AF AMER: 38 — AB
GLUCOSE: 187 mg/dL — AB (ref 65–99)
Osmolality: 290 (ref 275–301)
Potassium: 4.3 mmol/L (ref 3.5–5.1)
Sodium: 136 mmol/L (ref 136–145)

## 2013-09-01 LAB — CBC
HCT: 45.4 % (ref 35.0–47.0)
HGB: 15 g/dL (ref 12.0–16.0)
MCH: 28.6 pg (ref 26.0–34.0)
MCHC: 33 g/dL (ref 32.0–36.0)
MCV: 87 fL (ref 80–100)
PLATELETS: 239 10*3/uL (ref 150–440)
RBC: 5.24 10*6/uL — ABNORMAL HIGH (ref 3.80–5.20)
RDW: 14.1 % (ref 11.5–14.5)
WBC: 17.1 10*3/uL — ABNORMAL HIGH (ref 3.6–11.0)

## 2013-09-01 LAB — PRO B NATRIURETIC PEPTIDE: B-Type Natriuretic Peptide: 3103 pg/mL — ABNORMAL HIGH (ref 0–450)

## 2013-09-01 LAB — PROTIME-INR
INR: 4
Prothrombin Time: 37.6 secs — ABNORMAL HIGH (ref 11.5–14.7)

## 2013-09-01 LAB — TROPONIN I

## 2013-09-01 NOTE — Telephone Encounter (Signed)
See prev note., form placed in your Inbox

## 2013-09-01 NOTE — Telephone Encounter (Signed)
Before I can fill out the FL2 I need the d/c summary from [facility] - the H and P is in epic -but not the d/c summary I will hold the FL2 on my desk until I get it , thanks

## 2013-09-01 NOTE — Telephone Encounter (Signed)
Pt's son Honora Searson) came by the office and dropped off FL2 forms. I gave them to [facility] to be filled out by [facility]. Please contact son ([phone]) when completed and fax to Sandria Bales (Fax # [phone]).

## 2013-09-01 NOTE — Telephone Encounter (Signed)
Being that Dr Damita Dunnings is out. I have placed this paperwork on your desk

## 2013-09-03 ENCOUNTER — Encounter: Payer: Self-pay | Admitting: Internal Medicine

## 2013-09-03 LAB — CBC WITH DIFFERENTIAL/PLATELET
BASOS PCT: 0.1 %
Basophil #: 0 10*3/uL (ref 0.0–0.1)
EOS ABS: 0.1 10*3/uL (ref 0.0–0.7)
Eosinophil %: 0.5 %
HCT: 47 % (ref 35.0–47.0)
HGB: 15.4 g/dL (ref 12.0–16.0)
LYMPHS ABS: 0.6 10*3/uL — AB (ref 1.0–3.6)
LYMPHS PCT: 2.5 %
MCH: 28.7 pg (ref 26.0–34.0)
MCHC: 32.7 g/dL (ref 32.0–36.0)
MCV: 88 fL (ref 80–100)
MONO ABS: 1.3 x10 3/mm — AB (ref 0.2–0.9)
Monocyte %: 5.8 %
NEUTROS ABS: 20.5 10*3/uL — AB (ref 1.4–6.5)
Neutrophil %: 91.1 %
Platelet: 267 10*3/uL (ref 150–440)
RBC: 5.35 10*6/uL — ABNORMAL HIGH (ref 3.80–5.20)
RDW: 14.3 % (ref 11.5–14.5)
WBC: 22.5 10*3/uL — ABNORMAL HIGH (ref 3.6–11.0)

## 2013-09-03 LAB — COMPREHENSIVE METABOLIC PANEL
ALBUMIN: 2.7 g/dL — AB (ref 3.4–5.0)
ALK PHOS: 53 U/L
ALT: 21 U/L (ref 12–78)
AST: 14 U/L — AB (ref 15–37)
Anion Gap: 5 — ABNORMAL LOW (ref 7–16)
BILIRUBIN TOTAL: 0.9 mg/dL (ref 0.2–1.0)
BUN: 43 mg/dL — ABNORMAL HIGH (ref 7–18)
CREATININE: 1.28 mg/dL (ref 0.60–1.30)
Calcium, Total: 8.4 mg/dL — ABNORMAL LOW (ref 8.5–10.1)
Chloride: 103 mmol/L (ref 98–107)
Co2: 33 mmol/L — ABNORMAL HIGH (ref 21–32)
EGFR (African American): 44 — ABNORMAL LOW
EGFR (Non-African Amer.): 38 — ABNORMAL LOW
GLUCOSE: 117 mg/dL — AB (ref 65–99)
Osmolality: 293 (ref 275–301)
POTASSIUM: 3.6 mmol/L (ref 3.5–5.1)
Sodium: 141 mmol/L (ref 136–145)
TOTAL PROTEIN: 6.4 g/dL (ref 6.4–8.2)

## 2013-09-03 LAB — MAGNESIUM: Magnesium: 2 mg/dL

## 2013-09-03 LAB — PROTIME-INR
INR: 3.3
Prothrombin Time: 32.3 secs — ABNORMAL HIGH (ref 11.5–14.7)

## 2013-09-03 LAB — TSH: Thyroid Stimulating Horm: 1.14 u[IU]/mL

## 2013-09-03 NOTE — Telephone Encounter (Signed)
D/c summary received and placed in your inbox

## 2013-09-06 NOTE — Telephone Encounter (Signed)
In IN box 

## 2013-09-06 NOTE — Telephone Encounter (Signed)
Mailed to son per son's request

## 2013-09-07 ENCOUNTER — Ambulatory Visit: Payer: Medicare Other | Admitting: Internal Medicine

## 2013-09-07 DIAGNOSIS — Z0289 Encounter for other administrative examinations: Secondary | ICD-10-CM

## 2013-09-10 LAB — PROTIME-INR
INR: 12.9 — AB
PROTHROMBIN TIME: 92.3 s — AB (ref 11.5–14.7)

## 2013-09-12 LAB — PROTIME-INR
INR: 5.1
Prothrombin Time: 45.4 secs — ABNORMAL HIGH (ref 11.5–14.7)

## 2013-09-13 LAB — PROTIME-INR
INR: 4.4 — AB
PROTHROMBIN TIME: 40.8 s — AB (ref 11.5–14.7)

## 2013-09-15 LAB — PROTIME-INR
INR: 2.8
Prothrombin Time: 28.4 secs — ABNORMAL HIGH (ref 11.5–14.7)

## 2013-09-17 LAB — PROTIME-INR
INR: 2.9
Prothrombin Time: 29.7 secs — ABNORMAL HIGH (ref 11.5–14.7)

## 2013-09-20 LAB — PROTIME-INR
INR: 3.8
PROTHROMBIN TIME: 36.3 s — AB (ref 11.5–14.7)

## 2013-09-21 LAB — PROTIME-INR
INR: 3.2
PROTHROMBIN TIME: 31.8 s — AB (ref 11.5–14.7)

## 2013-09-23 LAB — PROTIME-INR
INR: 2.3
Prothrombin Time: 25 secs — ABNORMAL HIGH (ref 11.5–14.7)

## 2013-09-26 ENCOUNTER — Encounter: Payer: Self-pay | Admitting: Internal Medicine

## 2013-09-26 ENCOUNTER — Ambulatory Visit: Payer: Self-pay | Admitting: Nurse Practitioner

## 2013-09-28 LAB — PROTIME-INR
INR: 1.5
PROTHROMBIN TIME: 17.8 s — AB (ref 11.5–14.7)

## 2013-10-01 LAB — PROTIME-INR
INR: 1.4
PROTHROMBIN TIME: 17.1 s — AB (ref 11.5–14.7)

## 2013-10-10 ENCOUNTER — Telehealth: Payer: Self-pay | Admitting: Family Medicine

## 2013-10-10 NOTE — Telephone Encounter (Signed)
Christine Stone, CMA Tonia Ghent, MD            After asking around to [place] and some others, I was alerted to the fact that the patient's son brought some ppw here to be filled out back in January, in your absence. He left his phone number at that time and I was able to contact him this morning. He says she is in [facility] and likely will not return back to her home. She is happy there and is doing reasonably well. The facility does have it's own MD that rounds weekly and if they need any records from our office, he will ask them to fax [place] for the info.

## 2014-01-19 ENCOUNTER — Telehealth: Payer: Self-pay | Admitting: Family Medicine

## 2014-01-19 NOTE — Telephone Encounter (Signed)
Attempted to call pt to schedule INR check.  No answer and unable to leave vm.

## 2014-04-03 ENCOUNTER — Telehealth: Payer: Self-pay | Admitting: Family Medicine

## 2014-04-03 NOTE — Telephone Encounter (Signed)
Call pt.  She is due for repeat AAA u/s.  If she is willing to go, I'll put in the order.  Thanks.

## 2014-04-04 NOTE — Telephone Encounter (Signed)
Ms Speranza is in ALF now and is taken care of by the house MD according to her son.

## 2014-04-04 NOTE — Telephone Encounter (Signed)
Noted, thanks.  I'll defer.  Will you have me taken off as the PCP? Thanks.

## 2014-04-04 NOTE — Telephone Encounter (Signed)
Done

## 2014-04-14 ENCOUNTER — Ambulatory Visit: Payer: Self-pay | Admitting: Family Medicine

## 2014-04-15 IMAGING — CR DG CHEST 2V
2 series · 2 of 2 positions shown · non-contrast
Comparison: 03/27/2007

CLINICAL DATA: Shortness of breath.

CHEST - 2 VIEW

[view not recorded (1 of 2)]
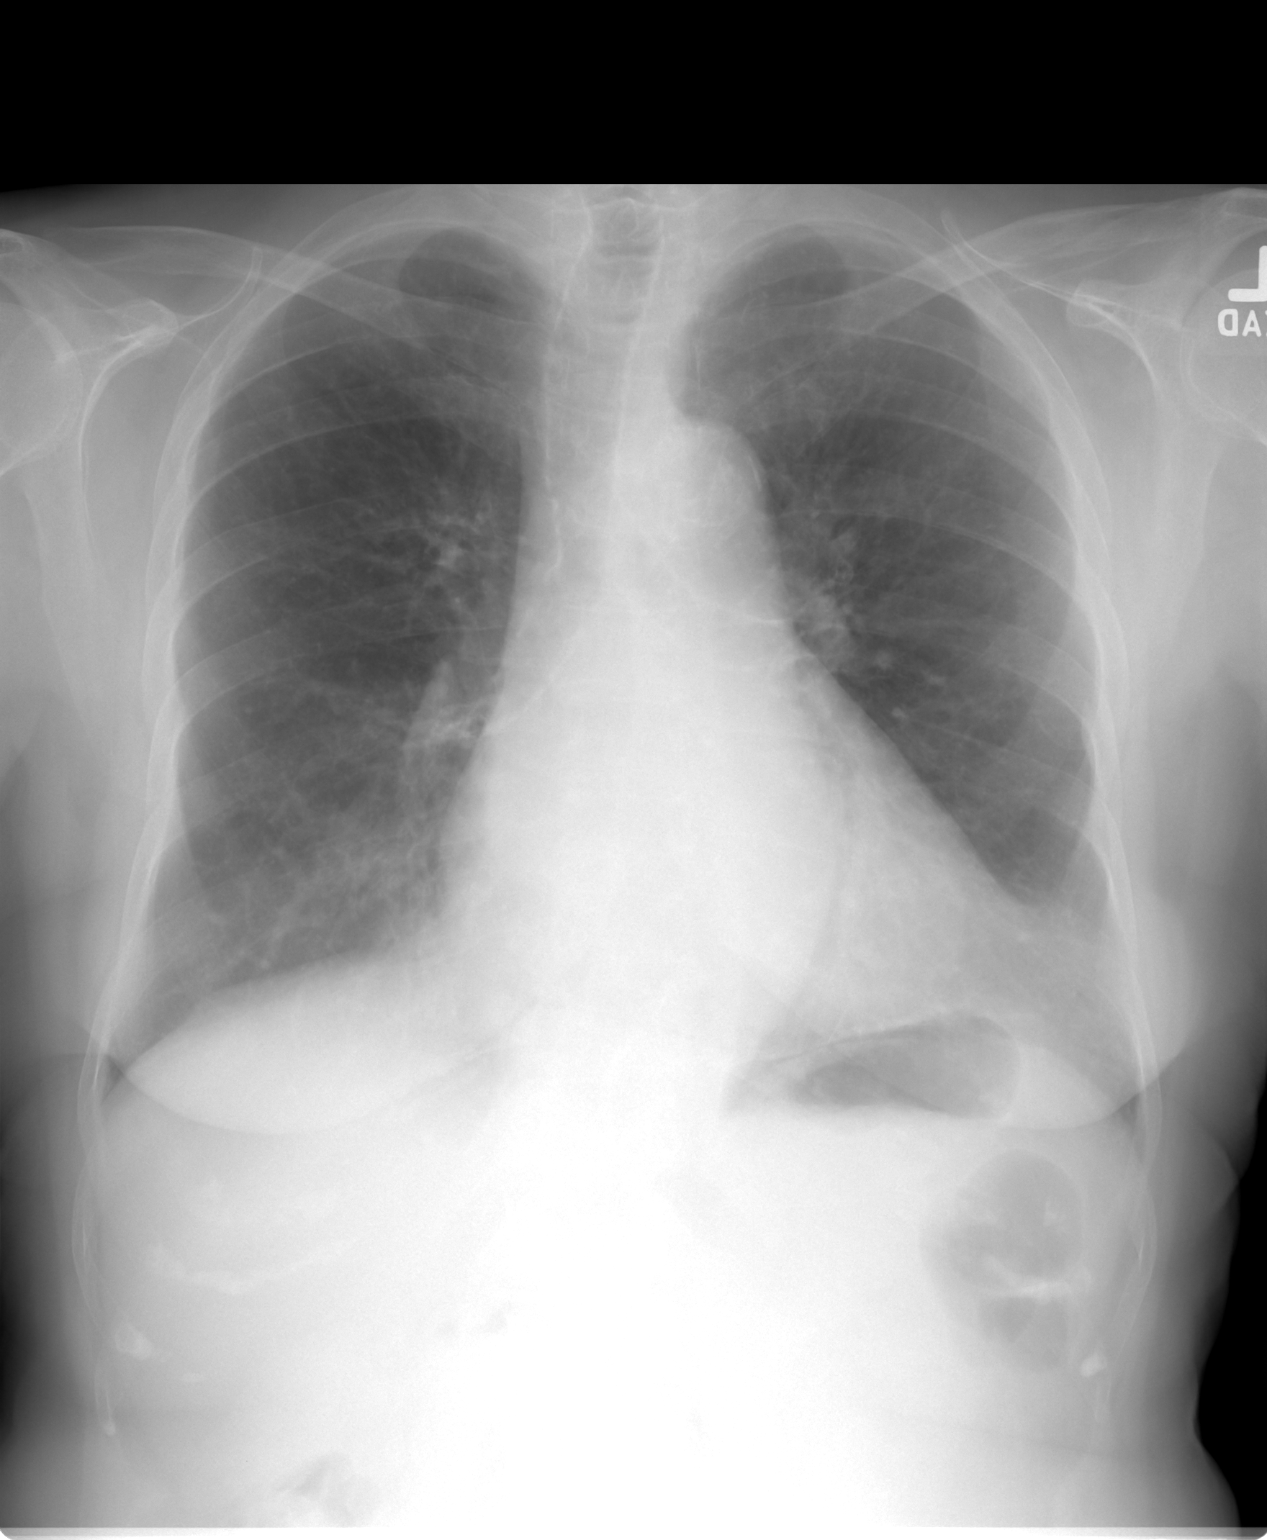

[view not recorded (2 of 2)]
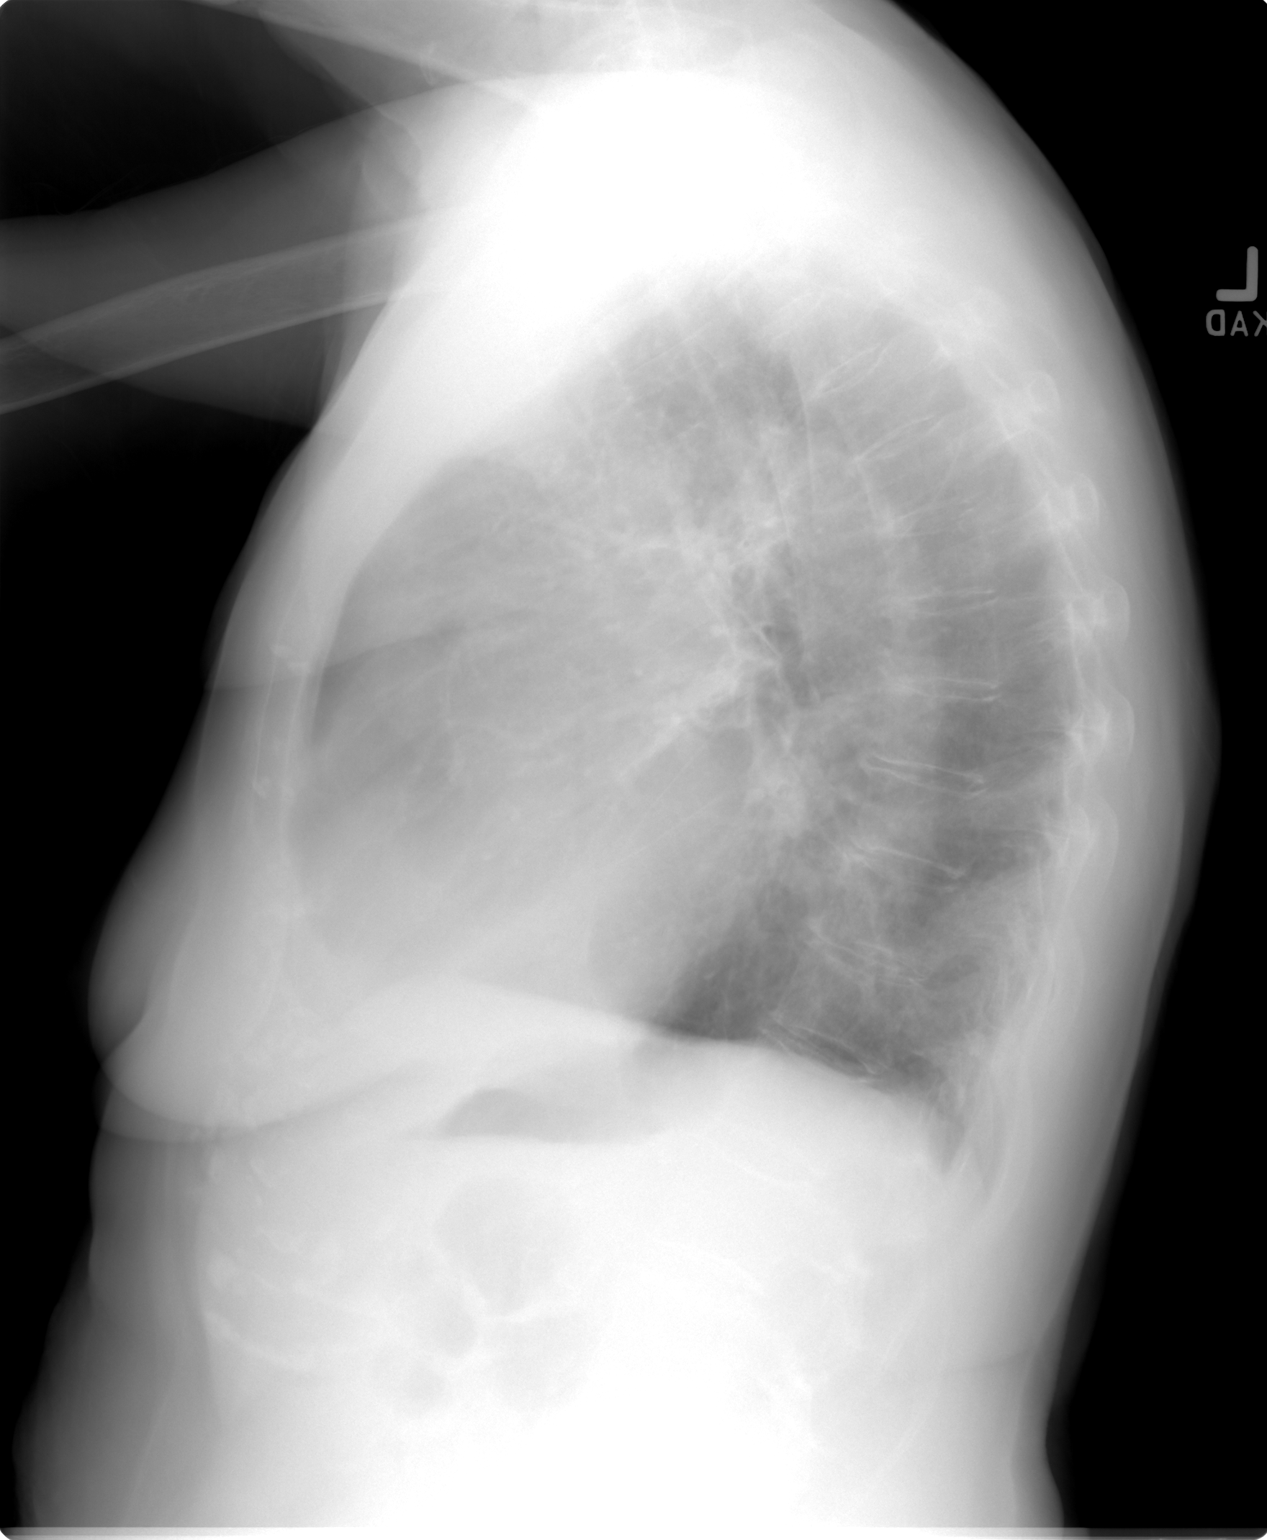

[2 of 2 positions shown; findings below may reference images not displayed]

FINDINGS: Mild cardiomegaly.  Scarring in the lingula.  Mild
hyperinflation of the lungs and peribronchial thickening, stable.
No new/acute opacities.  No effusions.  No acute bony abnormality.
IMPRESSION: Mild COPD/chronic changes.  No acute findings.

## 2014-05-13 ENCOUNTER — Telehealth: Payer: Self-pay | Admitting: *Deleted

## 2014-05-13 NOTE — Telephone Encounter (Signed)
Lmom to call our office. Time to schedule Abd Aorta u/s (1 yr f/u).

## 2014-06-05 ENCOUNTER — Emergency Department: Payer: Self-pay | Admitting: Emergency Medicine

## 2014-06-05 LAB — URINALYSIS, COMPLETE
Bacteria: NONE SEEN
Bilirubin,UR: NEGATIVE
GLUCOSE, UR: NEGATIVE mg/dL (ref 0–75)
NITRITE: NEGATIVE
PH: 5 (ref 4.5–8.0)
Protein: 100
SPECIFIC GRAVITY: 1.023 (ref 1.003–1.030)
Squamous Epithelial: NONE SEEN
Transitional Epi: 4

## 2014-06-05 LAB — COMPREHENSIVE METABOLIC PANEL
ALBUMIN: 3.4 g/dL (ref 3.4–5.0)
ANION GAP: 9 (ref 7–16)
Alkaline Phosphatase: 76 U/L
BUN: 26 mg/dL — AB (ref 7–18)
Bilirubin,Total: 0.6 mg/dL (ref 0.2–1.0)
CHLORIDE: 102 mmol/L (ref 98–107)
Calcium, Total: 8.7 mg/dL (ref 8.5–10.1)
Co2: 26 mmol/L (ref 21–32)
Creatinine: 1.69 mg/dL — ABNORMAL HIGH (ref 0.60–1.30)
EGFR (Non-African Amer.): 31 — ABNORMAL LOW
GFR CALC AF AMER: 37 — AB
Glucose: 95 mg/dL (ref 65–99)
OSMOLALITY: 278 (ref 275–301)
POTASSIUM: 4.5 mmol/L (ref 3.5–5.1)
SGOT(AST): 14 U/L — ABNORMAL LOW (ref 15–37)
SGPT (ALT): 12 U/L — ABNORMAL LOW
Sodium: 137 mmol/L (ref 136–145)
Total Protein: 6.8 g/dL (ref 6.4–8.2)

## 2014-06-05 LAB — CBC
HCT: 39.9 % (ref 35.0–47.0)
HGB: 12.8 g/dL (ref 12.0–16.0)
MCH: 29 pg (ref 26.0–34.0)
MCHC: 31.9 g/dL — ABNORMAL LOW (ref 32.0–36.0)
MCV: 91 fL (ref 80–100)
PLATELETS: 156 10*3/uL (ref 150–440)
RBC: 4.39 10*6/uL (ref 3.80–5.20)
RDW: 13.4 % (ref 11.5–14.5)
WBC: 9.3 10*3/uL (ref 3.6–11.0)

## 2014-06-07 LAB — URINE CULTURE

## 2014-06-10 ENCOUNTER — Ambulatory Visit: Payer: Self-pay

## 2014-06-10 LAB — URINALYSIS, COMPLETE
BACTERIA: NEGATIVE
Bilirubin,UR: NEGATIVE
GLUCOSE, UR: NEGATIVE
Ketone: NEGATIVE
NITRITE: NEGATIVE
Ph: 5 (ref 5.0–8.0)
Specific Gravity: >=1.03 (ref 1.000–1.030)

## 2014-06-12 LAB — URINE CULTURE

## 2014-06-16 ENCOUNTER — Encounter (INDEPENDENT_AMBULATORY_CARE_PROVIDER_SITE_OTHER): Payer: Self-pay

## 2014-06-16 ENCOUNTER — Ambulatory Visit (INDEPENDENT_AMBULATORY_CARE_PROVIDER_SITE_OTHER): Payer: Medicare Other | Admitting: Cardiovascular Disease

## 2014-06-16 ENCOUNTER — Encounter: Payer: Self-pay | Admitting: Cardiovascular Disease

## 2014-06-16 ENCOUNTER — Ambulatory Visit: Payer: Self-pay

## 2014-06-16 ENCOUNTER — Telehealth: Payer: Self-pay

## 2014-06-16 VITALS — BP 112/80 | HR 96 | Ht 63.0 in | Wt 139.5 lb

## 2014-06-16 DIAGNOSIS — R42 Dizziness and giddiness: Secondary | ICD-10-CM | POA: Insufficient documentation

## 2014-06-16 DIAGNOSIS — R05 Cough: Secondary | ICD-10-CM

## 2014-06-16 DIAGNOSIS — N184 Chronic kidney disease, stage 4 (severe): Secondary | ICD-10-CM

## 2014-06-16 DIAGNOSIS — I350 Nonrheumatic aortic (valve) stenosis: Secondary | ICD-10-CM

## 2014-06-16 DIAGNOSIS — R053 Chronic cough: Secondary | ICD-10-CM | POA: Insufficient documentation

## 2014-06-16 DIAGNOSIS — E119 Type 2 diabetes mellitus without complications: Secondary | ICD-10-CM

## 2014-06-16 DIAGNOSIS — I4891 Unspecified atrial fibrillation: Secondary | ICD-10-CM

## 2014-06-16 DIAGNOSIS — J438 Other emphysema: Secondary | ICD-10-CM

## 2014-06-16 DIAGNOSIS — I10 Essential (primary) hypertension: Secondary | ICD-10-CM

## 2014-06-16 LAB — URINALYSIS, COMPLETE
BACTERIA: NEGATIVE
BILIRUBIN, UR: NEGATIVE
Blood: NEGATIVE
GLUCOSE, UR: NEGATIVE
LEUKOCYTE ESTERASE: NEGATIVE
NITRITE: NEGATIVE
PROTEIN: NEGATIVE
Ph: 5 (ref 5.0–8.0)
RBC,UR: NONE SEEN /HPF (ref 0–5)
SPECIFIC GRAVITY: 1.02 (ref 1.000–1.030)

## 2014-06-16 NOTE — Assessment & Plan Note (Signed)
Most recent creatinine 1.69, BUN 26. Unable to exclude mild dehydration. Encouraged by mouth fluids

## 2014-06-16 NOTE — Assessment & Plan Note (Signed)
High-pitched murmur on exam today concerning for progression of her aortic valve stenosis. Sounds worse than mild to moderate by auscultation. Echocardiogram has been ordered to evaluate.

## 2014-06-16 NOTE — Progress Notes (Addendum)
Patient ID: Christine Stone, female    DOB: [birth year], 80 y.o.   MRN: [id number]  HPI Comments: Ms. Ratto is a 80 year old woman with history of aortic valve stenosis, hard of hearing,  hypertension, COPD , prior smoking history , diabetes , chronic atrial fibrillation on anticoagulation , who presents to establish care in the [place] office .  Notes indicate long history of atrial fibrillation with rate control, previously on warfarin, now on eliquis  5 mg twice a day.  Hospital admission January 2015 for COPD exacerbation, acute bronchitis treated with prednisone and antibiotics  She reports that she has periodic episodes of dizziness. Sometimes the room is spinning, other times she is dizzy. Uncertain if this happens when she is standing or sitting, she is unclear. Denies having any chest pain with exertion. She reports doing her exercise class every morning and does not typically have any problems such as shortness of breath or chest pain. Rarely has a tightness in her chest but this comes on at rest  Previous echocardiogram January 2015 showing mild to moderate aortic valve stenosis, low normal ejection fraction 50-55%, mild LVH, normal right ventricular systolic pressure  Recent lab work 06/05/2014 showing creatinine 1.69, BUN 26  One of her biggest complaints is a chronic cough. She feels that he has been worse this month. Of note amlodipine was held at the beginning of October and she was changed to lisinopril.   EKG shows atrial fibrillation with no significant ST or T wave changes    Outpatient Encounter Prescriptions as of 06/16/2014  Medication Sig  . budesonide-formoterol (SYMBICORT) 160-4.5 MCG/ACT inhaler Inhale 2 puffs into the lungs 2 (two) times daily.  . carvedilol (COREG) 12.5 MG tablet Take 12.5 mg by mouth 2 (two) times daily with a meal.   . Cetirizine HCl (ZYRTEC ALLERGY) 10 MG CAPS Take 10 mg by mouth daily.  Marland Kitchen ELIQUIS 5 MG TABS tablet Take 5 mg by mouth 2  (two) times daily.   Marland Kitchen levothyroxine (SYNTHROID, LEVOTHROID) 88 MCG tablet Take 88 mcg by mouth daily before breakfast.  . lisinopril (PRINIVIL,ZESTRIL) 2.5 MG tablet Take 2.5 mg by mouth daily.  . metFORMIN (GLUCOPHAGE) 500 MG tablet Take 500 mg by mouth 2 (two) times daily with a meal.   . SPIRIVA HANDIHALER 18 MCG inhalation capsule Place 18 mcg into inhaler and inhale daily.    Social History Main Topics   .  Smoking status:  Former Smoker -- 1.00 packs/day for 20 years     Types:  Cigarettes     Quit date:  08/27/1990   .  Smokeless tobacco:  Never Used      Comment: 1/2 ppd x 40 years quit 1988.   Marland Kitchen  Alcohol Use:  No   .  Drug Use:  No   .  Sexual Activity:  Not on file    Other Topics  Concern   .  Not on file    Social History Narrative    Retired from [employer]    Married 1945    Family History   Problem  Relation  Age of Onset   .  Thrombosis  Father  48     thrombosis of heart   .  Coronary artery disease  Father    .  Cancer  Sister    .  Heart failure  Sister    .  Hypertension  Sister    .  Cancer  Brother  kidney      Review of Systems  Constitutional: Negative.   HENT: Positive for hearing loss.   Eyes: Negative.   Respiratory: Negative.   Cardiovascular: Negative.   Gastrointestinal: Negative.   Endocrine: Negative.   Musculoskeletal: Positive for gait problem.  Skin: Negative.   Allergic/Immunologic: Negative.   Neurological: Positive for dizziness.  Hematological: Negative.   Psychiatric/Behavioral: Negative.   All other systems reviewed and are negative.   BP 112/80  Pulse 96  Ht 5\' 3"  (1.6 m)  Wt 139 lb 8 oz (63.277 kg)  BMI 24.72 kg/m2  Physical Exam  Nursing note and vitals reviewed. Constitutional: She is oriented to person, place, and time. She appears well-developed and well-nourished.  HENT:  Head: Normocephalic.  Nose: Nose normal.  Mouth/Throat: Oropharynx is clear and moist.  Eyes: Conjunctivae are  normal. Pupils are equal, round, and reactive to light.  Neck: Normal range of motion. Neck supple. No JVD present.  Cardiovascular: Normal rate, S1 normal, S2 normal and intact distal pulses.  An irregularly irregular rhythm present. Exam reveals no gallop and no friction rub.   Murmur heard.  Systolic murmur is present with a grade of 2/6  Pulmonary/Chest: Effort normal and breath sounds normal. No respiratory distress. She has no wheezes. She has no rales. She exhibits no tenderness.  Abdominal: Soft. Bowel sounds are normal. She exhibits no distension. There is no tenderness.  Musculoskeletal: Normal range of motion. She exhibits no edema and no tenderness.  Lymphadenopathy:    She has no cervical adenopathy.  Neurological: She is alert and oriented to person, place, and time. Coordination normal.  Skin: Skin is warm and dry. No rash noted. No erythema.  Psychiatric: She has a normal mood and affect. Her behavior is normal. Judgment and thought content normal.    Assessment and Plan

## 2014-06-16 NOTE — Patient Instructions (Signed)
We have scheduled you for an echocardiogram for aortic valve stenosis  Please hold the lisinopril Start amlodipine 2.5 mg daily, Hold the amlodipine for SBP <110  Please call us if you have new issues that need to be addressed before your next appt.  Your physician wants you to follow-up in: 6 months.  You will receive a reminder letter in the mail two months in advance. If you don't receive a letter, please call our office to schedule the follow-up appointment.   Your next appointment will be scheduled in our new office located at :  [facility]  [address] [place]  [place], [place] [address]

## 2014-06-16 NOTE — Assessment & Plan Note (Signed)
Prior smoking history. Currently not smoking. She is on inhalers

## 2014-06-16 NOTE — Assessment & Plan Note (Signed)
We'll hold the ACE inhibitor to see if symptoms get better. May benefit from proton pump inhibitor trial if symptoms persist

## 2014-06-16 NOTE — Assessment & Plan Note (Signed)
Blood pressure typically runs 536 up to 144 systolic, but does appear to be labile occasionally down to 315 systolic. This is concerning given her periods of dizziness. Given her recent worsening cough, we will hold the lisinopril, restart amlodipine 2.5 mg daily with hold parameters

## 2014-06-16 NOTE — Assessment & Plan Note (Signed)
Etiology is not clear. Documentation from the nursing home showing labile blood pressures, sometimes 517 systolic. We'll put hold parameters on her amlodipine. Patient denies clear orthostatic symptoms as sometimes she is dizzy when sitting. Possibly rare vertigo as she does report very rare spinning of the room. This is likely different from her chronic dizziness.

## 2014-06-16 NOTE — Assessment & Plan Note (Signed)
Heart rate relatively well-controlled on her current medications. No changes made. We'll continue on anticoagulation. No recent falls

## 2014-06-16 NOTE — Assessment & Plan Note (Signed)
Currently on metformin for diabetes. Managed by primary care

## 2014-06-16 NOTE — Telephone Encounter (Signed)
NP called wants to talk w dr Rockey Situ regarding pt Lisinopril

## 2014-06-18 LAB — URINE CULTURE

## 2014-06-19 NOTE — Telephone Encounter (Signed)
Would fax my note to NP She can call the clinic anytime to talk at her convenience

## 2014-06-21 NOTE — Telephone Encounter (Signed)
Dr. Rockey Situ spoke w/ nurse.

## 2014-06-30 ENCOUNTER — Other Ambulatory Visit (INDEPENDENT_AMBULATORY_CARE_PROVIDER_SITE_OTHER): Payer: Medicare Other

## 2014-06-30 ENCOUNTER — Other Ambulatory Visit: Payer: Self-pay

## 2014-06-30 DIAGNOSIS — I350 Nonrheumatic aortic (valve) stenosis: Secondary | ICD-10-CM

## 2014-06-30 DIAGNOSIS — I4891 Unspecified atrial fibrillation: Secondary | ICD-10-CM

## 2014-07-15 ENCOUNTER — Ambulatory Visit: Payer: Self-pay

## 2014-07-15 LAB — URINALYSIS, COMPLETE
BILIRUBIN, UR: NEGATIVE
Bacteria: NEGATIVE
Glucose,UR: NEGATIVE
Ketone: NEGATIVE
NITRITE: NEGATIVE
PH: 5.5 (ref 5.0–8.0)
Protein: NEGATIVE
Specific Gravity: 1.005 (ref 1.000–1.030)
Squamous Epithelial: NONE SEEN

## 2014-07-17 LAB — URINE CULTURE

## 2014-09-28 ENCOUNTER — Telehealth: Payer: Self-pay

## 2014-09-28 NOTE — Telephone Encounter (Signed)
Spoke w/ pt's son.   He reports that pt's BP has been dropping over the past several weeks.  He does not have any other readings. States that he "thinks they stopped her BP medicine" but he is unsure of which one.  He is unsure if pt has access to fluids throughout the day other than w/ her meals.  Reports that pt feels "whoozy". Advised him to speak w/ SNF and call tomorrow w/ BP readings, pt sx and see if he can bring her some Gatorade to keep in her room.  Asked him to call back w/ BP readings and med update.

## 2014-09-28 NOTE — Telephone Encounter (Signed)
Pt son called, states pt BP has been dropping, Dr. At facility where she stays took her off her medication, and it is still dropping. 100/50 this a.m.States it has done this several times over the last couple weeks. Please call.

## 2014-10-06 ENCOUNTER — Encounter: Payer: Self-pay | Admitting: Cardiovascular Disease

## 2014-10-06 ENCOUNTER — Ambulatory Visit (INDEPENDENT_AMBULATORY_CARE_PROVIDER_SITE_OTHER): Payer: Medicare Other | Admitting: Cardiovascular Disease

## 2014-10-06 VITALS — BP 140/60 | HR 78 | Ht 63.0 in | Wt 135.2 lb

## 2014-10-06 DIAGNOSIS — I951 Orthostatic hypotension: Secondary | ICD-10-CM | POA: Insufficient documentation

## 2014-10-06 DIAGNOSIS — I4891 Unspecified atrial fibrillation: Secondary | ICD-10-CM

## 2014-10-06 DIAGNOSIS — I350 Nonrheumatic aortic (valve) stenosis: Secondary | ICD-10-CM

## 2014-10-06 DIAGNOSIS — I10 Essential (primary) hypertension: Secondary | ICD-10-CM

## 2014-10-06 NOTE — Progress Notes (Signed)
Patient ID: Christine Stone, female    DOB: 1925-12-04, 81 y.o.   MRN: [id number]  HPI Comments: [name] is a 81 year old woman with history of aortic valve stenosis, hard of hearing,  hypertension, COPD , prior smoking history , diabetes , chronic atrial fibrillation on anticoagulation , who presents for routine follow-up of her aortic valve stenosis Previous echocardiogram showing moderate aortic valve stenosis  In follow-up today, she reports that she was having severe lightheaded and weakness spells. Her son started providing  Gatorade.  Lisinopril was held Since then her symptoms have significantly improved Orthostatics done in the office today show blood pressure 162/73 supine, 151/80 sitting, 127/80 standing that improved to 152/84 after several minutes. No significant change in heart rate  EKG on today's visit shows atrial fibrillation with rate 78 bpm, no significant ST or T-wave changes  Other past medical history Notes indicate long history of atrial fibrillation with rate control, previously on warfarin, now on eliquis  5 mg twice a day.  Hospital admission January 2015 for COPD exacerbation, acute bronchitis treated with prednisone and antibiotics  Previous echocardiogram January 2015 showing mild to moderate aortic valve stenosis, low normal ejection fraction 50-55%, mild LVH, normal right ventricular systolic pressure lab work 06/05/2014 showing creatinine 1.69, BUN 26  Prior history of chronic cough. She feels that he has been worse this month. Of note amlodipine was held at the beginning of October and she was changed to lisinopril.     Allergies  Allergen Reactions  . Loratadine     REACTION: makes head feel funny  . Other     Allergic to bandaid adhesive- local recation  . Sulfasalazine   . Tape     Local reaction to adhesive.      Outpatient Encounter Prescriptions as of 10/06/2014  Medication Sig  . budesonide-formoterol (SYMBICORT) 79-4.5 MCG/ACT  inhaler Inhale 2 puffs into the lungs 2 (two) times daily.  . carvedilol (COREG) 12.5 MG tablet Take 12.5 mg by mouth 2 (two) times daily with a meal.   . Cetirizine HCl (ZYRTEC ALLERGY) 10 MG CAPS Take 10 mg by mouth daily.  Marland Kitchen ELIQUIS 5 MG TABS tablet Take 5 mg by mouth 2 (two) times daily.   . meclizine (ANTIVERT) 25 MG tablet Take 25 mg by mouth daily.  . Melatonin 3 MG CAPS Take 3 mg by mouth at bedtime.  . metFORMIN (GLUCOPHAGE) 500 MG tablet Take 500 mg by mouth 2 (two) times daily with a meal.   . nystatin (MYCOSTATIN/NYSTOP) 100000 UNIT/GM POWD 2 (two) times daily.   Marland Kitchen omeprazole (PRILOSEC) 20 MG capsule Take 20 mg by mouth daily.  Marland Kitchen SPIRIVA HANDIHALER 18 MCG inhalation capsule Place 18 mcg into inhaler and inhale daily.   Marland Kitchen spironolactone (ALDACTONE) 25 MG tablet Take 12.5 mg by mouth daily.  . [DISCONTINUED] levothyroxine (SYNTHROID, LEVOTHROID) 88 MCG tablet Take 88 mcg by mouth daily before breakfast.  . lisinopril (PRINIVIL,ZESTRIL) 2.5 MG tablet Take 2.5 mg by mouth daily.    Past Medical History  Diagnosis Date  . Atrial fibrillation     On coumadin   . CHF (congestive heart failure)     Diastolic -- echo 76/16 EF 07%[id number] diastolic dysfunction mild AS   . Hyperlipidemia   . Hypothyroidism   . Diabetes mellitus     Type II   . GERD (gastroesophageal reflux disease)     Sleep study   . Chest pain     R/Od A. fib   .  Syncope 05-22-08 - 05/23/08    [facility] syncope R/o'd ARI  . PMR (polymyalgia rheumatica)   . Fracture of multiple pubic rami     [facility] 2013  . Hypertension   . COPD (chronic obstructive pulmonary disease)   . Depression   . Idiopathic peripheral neuropathy     Past Surgical History  Procedure Laterality Date  . Appendectomy  1945  . Partial hysterectomy      Ovaries intact   . Pleural scarification      Pleural Fluid B9    Social History  reports that she quit smoking about 24 years ago. Her smoking use included Cigarettes. She has a 20  pack-year smoking history. She has never used smokeless tobacco. She reports that she does not drink alcohol or use illicit drugs.  Family History family history includes Cancer in her brother and sister; Coronary artery disease in her father; Heart failure in her sister; Hypertension in her sister; Thrombosis (age of onset: 72) in her father.   Review of Systems  HENT: Positive for hearing loss.   Respiratory: Negative.   Cardiovascular: Negative.   Gastrointestinal: Negative.   Musculoskeletal: Positive for gait problem.  Skin: Negative.   Allergic/Immunologic: Negative.   Neurological: Positive for dizziness and weakness.  Hematological: Negative.   Psychiatric/Behavioral: Negative.   All other systems reviewed and are negative.   BP 140/60 mmHg  Pulse 78  Ht 5\' 3"  (1.6 m)  Wt 135 lb 4 oz (61.349 kg)  BMI 23.96 kg/m2  Physical Exam  Constitutional: She is oriented to person, place, and time. She appears well-developed and well-nourished.  HENT:  Head: Normocephalic.  Nose: Nose normal.  Mouth/Throat: Oropharynx is clear and moist.  Eyes: Conjunctivae are normal. Pupils are equal, round, and reactive to light.  Neck: Normal range of motion. Neck supple. No JVD present.  Cardiovascular: Normal rate, S1 normal, S2 normal and intact distal pulses.  An irregularly irregular rhythm present. Exam reveals no gallop and no friction rub.   Murmur heard.  Systolic murmur is present with a grade of 2/6  Pulmonary/Chest: Effort normal and breath sounds normal. No respiratory distress. She has no wheezes. She has no rales. She exhibits no tenderness.  Abdominal: Soft. Bowel sounds are normal. She exhibits no distension. There is no tenderness.  Musculoskeletal: Normal range of motion. She exhibits no edema or tenderness.  Lymphadenopathy:    She has no cervical adenopathy.  Neurological: She is alert and oriented to person, place, and time. Coordination normal.  Skin: Skin is warm and  dry. No rash noted. No erythema.  Psychiatric: She has a normal mood and affect. Her behavior is normal. Judgment and thought content normal.    Assessment and Plan  Nursing note and vitals reviewed.

## 2014-10-06 NOTE — Assessment & Plan Note (Signed)
Episodes of weakness and lightheadedness have improved by holding lisinopril, and increasing her fluid intake with Gatorade. Mild orthostasis again seen today though she is asymptomatic. We have recommended that she continue her fluid intake, and if she continues to have episodes that she call our office. We would decrease the dose of the carvedilol.

## 2014-10-06 NOTE — Patient Instructions (Signed)
You are doing well. No medication changes were made.  Please continue to drink more fluids, Call the office if you have more weak spells  Please call us if you have new issues that need to be addressed before your next appt.  Your physician wants you to follow-up in: 6 months.  You will receive a reminder letter in the mail two months in advance. If you don't receive a letter, please call our office to schedule the follow-up appointment.

## 2014-10-06 NOTE — Assessment & Plan Note (Signed)
Chronic atrial fibrillation, rate controlled with carvedilol. We'll continue 12.5 mg twice a day

## 2014-10-06 NOTE — Assessment & Plan Note (Signed)
Moderate aortic valve stenosis on recent echocardiogram. Medical management at this time and she is currently asymptomatic

## 2014-10-06 NOTE — Assessment & Plan Note (Signed)
Elevated blood pressure when supine, some orthostasis. No other medication changes made, encourage her to increase her fluid intake

## 2014-11-26 ENCOUNTER — Emergency Department
Admit: 2014-11-26 | Disposition: A | Discharge status: Skilled Nursing Facility | Payer: Self-pay | Admitting: Internal Medicine

## 2014-11-26 LAB — COMPREHENSIVE METABOLIC PANEL
ALK PHOS: 73 U/L
ANION GAP: 8 (ref 7–16)
AST: 19 U/L
Albumin: 4.1 g/dL
BUN: 31 mg/dL — ABNORMAL HIGH
Bilirubin,Total: 0.7 mg/dL
CALCIUM: 9.8 mg/dL
Chloride: 100 mmol/L — ABNORMAL LOW
Co2: 27 mmol/L
Creatinine: 1.66 mg/dL — ABNORMAL HIGH
EGFR (African American): 32 — ABNORMAL LOW
EGFR (Non-African Amer.): 28 — ABNORMAL LOW
Glucose: 199 mg/dL — ABNORMAL HIGH
Potassium: 5 mmol/L
SGPT (ALT): 9 U/L — ABNORMAL LOW
Sodium: 135 mmol/L
Total Protein: 7.7 g/dL

## 2014-11-26 LAB — CBC WITH DIFFERENTIAL/PLATELET
Basophil #: 0.1 10*3/uL (ref 0.0–0.1)
Basophil %: 0.5 %
EOS ABS: 0.3 10*3/uL (ref 0.0–0.7)
Eosinophil %: 3.2 %
HCT: 40 % (ref 35.0–47.0)
HGB: 13.2 g/dL (ref 12.0–16.0)
Lymphocyte #: 1.2 10*3/uL (ref 1.0–3.6)
Lymphocyte %: 11.1 %
MCH: 29.2 pg (ref 26.0–34.0)
MCHC: 32.9 g/dL (ref 32.0–36.0)
MCV: 89 fL (ref 80–100)
Monocyte #: 0.6 x10 3/mm (ref 0.2–0.9)
Monocyte %: 5.9 %
NEUTROS ABS: 8.5 10*3/uL — AB (ref 1.4–6.5)
Neutrophil %: 79.3 %
Platelet: 175 10*3/uL (ref 150–440)
RBC: 4.51 10*6/uL (ref 3.80–5.20)
RDW: 13.7 % (ref 11.5–14.5)
WBC: 10.7 10*3/uL (ref 3.6–11.0)

## 2014-11-26 LAB — URINALYSIS, COMPLETE
BACTERIA: NONE SEEN
BILIRUBIN, UR: NEGATIVE
BLOOD: NEGATIVE
GLUCOSE, UR: NEGATIVE mg/dL (ref 0–75)
Hyaline Cast: 3
Leukocyte Esterase: NEGATIVE
Nitrite: NEGATIVE
PH: 6 (ref 4.5–8.0)
Protein: 30
RBC,UR: <1 /HPF (ref 0–5)
Specific Gravity: 1.015 (ref 1.003–1.030)
WBC UR: <1 /HPF (ref 0–5)

## 2014-11-26 LAB — TROPONIN I: Troponin-I: <0.03 ng/mL

## 2014-12-13 NOTE — Consult Note (Signed)
Brief Consult Note: Diagnosis: closed L pelvic sup/inf pubic rami, L sacral ala fx.   Patient was seen by consultant.   Consult note dictated.   Recommend further assessment or treatment.   Comments: wbat LLE with walker f/u office Dr Margaretmary Eddy 10-14 days signing off--call if further ?s.  Electronic Signatures: Maebelle Munroe (MD)  (Signed 07-Dec-13 13:03)  Authored: Brief Consult Note   Last Updated: 07-Dec-13 13:03 by Maebelle Munroe (MD)

## 2014-12-13 NOTE — Discharge Summary (Signed)
PATIENT NAME:  Christine Stone, Christine Stone MR#:  [id number] DATE OF BIRTH:  February 01, 1926  DATE OF ADMISSION:  07/31/2012 DATE OF DISCHARGE:  08/03/2012   ADMISSION DIAGNOSES:  1. Pubic rami fracture status post mechanical fall.  2. History of atrial fibrillation.   DISCHARGE DIAGNOSES:  1. Pubic rami fracture after mechanical fall.  2. Acute renal failure, resolved.  3. History of atrial fibrillation.  4. Hypothyroidism.  5. Polymyalgia rheumatica, on chronic steroids.  6. Chronic obstructive pulmonary disease.  7. Abdominal aortic aneurysm.   CONSULT: Orthopedics    PERTINENT LABORATORY AT DISCHARGE: Sodium 139, potassium 4.3, chloride 106, bicarb 28, BUN 15, creatinine 1.23, glucose 97, hemoglobin 12.8.   CT of the pelvis showed no hip fracture. There was fracture of the left superior and inferior pubic rami, findings of hemorrhage superomedial to the fracture site which appears to be extraperitoneal adjacent to the bladder. If there is concern for bladder injury, CT cystogram could be performed.   HOSPITAL COURSE: This is an 81 year old female status post mechanical fall on Coumadin due to history of atrial fibrillation who presented with pubic rami fracture. For further details, please refer to the history and physical.  1. Superior and inferior left pubic rami fracture after a mechanical fall. The patient was admitted for supportive care. This is obviously nonoperative. We appreciate Orthopedic consult as well as physical therapy. The patient will need to go to rehab. Her hemoglobin remained stable. Her CT was concerning for some sort of hemorrhage so her Coumadin has been taken off for now. She continues to be on pain medications and, as mentioned, will need rehab for her pubic rami fracture.  2. Acute renal failure, resolved with IV fluids.  3. History of atrial fibrillation. We are holding Coumadin right now due to the hematoma. The patient has an appointment on Thursday with her primary care  physician. At that time he can consider restarting the Coumadin.  4. Hypothyroidism. Her TSH is in normal range. Continue Synthroid.   5. Polymyalgia rheumatica. She will continue on prednisone.   DISCHARGE MEDICATIONS:  1. Aspirin 325 mg daily.  2. Prednisone 5 mg daily.  3. Verapamil 80 mg daily.  4. Diovan 80 mg daily.  5. Oxycodone 5 mg q.4 to 6 hours p.r.n. pain.  6. Synthroid 88 mcg daily.  7. Colace 100 mg b.i.d. p.r.n. constipation.   DO NOT TAKE: The patient will stop taking Coumadin for now.   DISCHARGE DIET: Low sodium.   DISCHARGE ACTIVITY: As tolerated with physical therapy.   FOLLOW-UP: The patient will follow-up on Thursday with [place] with Dr. Damita Dunnings.   TIME SPENT: Approximately 35 minutes.   ____________________________ Donell Beers. Benjie Karvonen, MD spm:drc D: 08/03/2012 08:26:30 ET T: 08/03/2012 08:38:59 ET JOB#: [id number]  cc: Tyge Somers P. Benjie Karvonen, MD, <Dictator> Elveria Rising. Damita Dunnings, MD Donell Beers Evalin Shawhan MD ELECTRONICALLY SIGNED 08/03/2012 11:04

## 2014-12-13 NOTE — H&P (Signed)
PATIENT NAME:  Christine Stone, Christine Stone MR#:  [id number] DATE OF BIRTH:  [birth year]  DATE OF ADMISSION:  07/31/2012  PRIMARY CARE PHYSICIAN: Dr. Damita Dunnings at [place]: Left hip pain.   HISTORY OF PRESENT ILLNESS: This is an 81 year old female who was out emptying a bucket. She was walking backwards and tripped and fell. Normally she is very active. She is out trimming hedges and mowing the lawn. She was found in the ER to have a displaced fracture of the left superior and inferior pubic rami and hospitalist services were contacted for further evaluation. ER physician spoke with the orthopedic surgeon who recommended a CT scan of the abdomen and pelvis. Those results were still pending at the time of hospitalist admission.   PAST MEDICAL HISTORY:  1. Diabetes. 2. Atrial fibrillation. 3. Hypothyroidism. 4. Chronic obstructive pulmonary disease. 5. Polymyalgia rheumatica.   PAST SURGICAL HISTORY:  1. Tonsillectomy.  2. Appendectomy. 3. Vein stripping.  4. Hysterectomy. 5. Dental surgery.  6. Cataracts.   ALLERGIES: Sulfa and intolerance to pain medications.   MEDICATIONS:  1. Levoxyl, unknown dose. 2. Coumadin is usually 2.5 mg daily but recently increased to 5 for low level.  3. Prednisone 5 mg daily.  4. Occasional Tylenol PM.   SOCIAL HISTORY: Lives with her husband. Quit smoking 25 years ago. No alcohol. No drug use. Used to work at [employer].   FAMILY HISTORY: Father died at 79 of thrombosis. Mother died at 60, unclear cause. Brother died at 33 of MI. Another brother died of kidney cancer.    REVIEW OF SYSTEMS: CONSTITUTIONAL: No fever. Positive for chills. No sweats. Positive for fatigue. EYES: No issue with the eyes. EARS, NOSE, MOUTH, AND THROAT: Decreased hearing. Positive for runny nose. No sore throat. No difficulty swallowing. CARDIOVASCULAR: No chest pain. No palpitations. RESPIRATORY: Positive for shortness of breath, cough, dry. No  hemoptysis. No sputum. GASTROINTESTINAL: Positive for nausea. Positive for constipation. No abdominal pain. No bright red blood per rectum. No melena. GENITOURINARY: Occasional blood in the urine, last a couple of weeks ago. Occasional burning on urination. Some itching in the vagina. INTEGUMENT: No rashes. MUSCULOSKELETAL: Positive for left hip pain. NEUROLOGIC: No fainting or blackouts. PSYCHIATRIC: Some anxiety and depression when she watches the news. ENDOCRINE: Positive for hypothyroidism. HEMATOLOGIC/LYMPHATIC: No anemia.   PHYSICAL EXAMINATION:  VITAL SIGNS: Temperature 98.5, pulse 83, respirations 20, blood pressure 158/83, pulse oximetry 95% on room air.   GENERAL: No respiratory distress.   EYES: Conjunctivae and lids normal. Pupils equal, round, and reactive to light. Extraocular muscles intact. No nystagmus.   EARS, NOSE, MOUTH, AND THROAT: Tympanic membranes no erythema. Nasal mucosa no erythema. Throat no erythema. No exudate seen. Lips and gums no lesions.   NECK: No JVD. No bruits. No lymphadenopathy. No thyromegaly. No thyroid nodules palpated.   RESPIRATORY: Lungs clear to auscultation. No use of accessory muscles to breathe. No rhonchi, rales, or wheeze heard.   CARDIOVASCULAR: S1 and S2 irregular, irregular. No gallops, rubs, or murmurs heard. Carotid upstroke 2+ bilaterally. No bruits. Dorsalis pedis pulses 1+ bilaterally. 1+ edema bilateral lower extremity.   ABDOMEN: Soft, slight discomfort in the left lower quadrant. No organomegaly/splenomegaly. Normoactive bowel sounds.   LYMPHATIC: No lymph nodes in the neck.   MUSCULOSKELETAL: No clubbing. Trace edema. No cyanosis. Unable to lift the left leg up off the bed.   SKIN: No rash or ulcers. Varicose veins bilateral lower extremities.   NEUROLOGICAL: Cranial nerves  II through XII grossly intact. Deep tendon reflexes 1+ bilateral lower extremity. Patient is able to lift her toes up and down bilaterally but unable to lift  the left leg up off the bed.   PSYCHIATRIC: Patient is oriented to person, place, and time.   LABORATORY, DIAGNOSTIC, AND RADIOLOGICAL DATA: Pelvis x-ray minimally displaced fractures of the left superior and inferior pubic rami. Left hip complete: No hip fracture. Chest x-ray: Prominence of the mid and superior mediastinum. TSH 1.05, INR 1.3, glucose 109, BUN 26, creatinine 1.43, sodium 139, potassium 4.1, chloride 106, CO2 26, calcium 8.7, white blood cell count 10.0, hemoglobin and hematocrit 12.2 and 36.8, platelet count 174. Urinalysis 1+ blood, otherwise negative. This CT scan of the pelvis was not resulted at the time of initial seeing the patient. No hip fracture was seen. Fractures of the left superior and inferior pubic rami. Findings of hemorrhage superior and medial at the fracture site which is adjacent to the bladder measuring 6 x 3.6 cm. There is also an abdominal aortic aneurysm measuring 3.4 x 2.8 cm. EKG: Atrial fibrillation 87 beats per minute.   ASSESSMENT AND PLAN:  1. Pubic rami fracture with hematoma. Will get serial hemoglobins. This qualifies for admission. IV fluid hydration. At this point I am going to hold Coumadin until I figure out which way the hemoglobins are trending; higher risk of stroke explained. Will get physical therapy consultation, orthopedic consultation. Pubic rami fracture usually just pain control with IV and p.o. medications. Patient will also need nausea medications.  2. Acute renal failure versus chronic kidney disease. Will give 1 liter of IV fluid to see if creatinine improves.  3. Atrial fibrillation. Coumadin level is low. With the hematoma will have to hold Coumadin; higher risk of stroke explained. Patient states that she is not on any rate controlling medications, currently rate is controlled.  4. Hypothyroidism. Need to figure out the dose of the Levoxyl. Will use the one that is in here from the last time she was in the hospital. TSH is in the normal  range.  5. Polymyalgia rheumatica, on prednisone 5 mg daily, will continue.  6. Chronic obstructive pulmonary disease, respiratory status stable. Pulse oximetry normal range. Patient does not take any inhalers.  7. Abdominal aortic aneurysm. Will need outpatient follow up for this.    TIME SPENT ON ADMISSION: 60 minutes.   ____________________________ Tana Conch. Leslye Peer, MD rjw:cms D: 07/31/2012 21:26:50 ET T: 08/01/2012 08:08:07 ET JOB#: [id number]  cc: Tana Conch. Leslye Peer, MD, <Dictator> Elveria Rising. Damita Dunnings, MD  Marisue Brooklyn MD ELECTRONICALLY SIGNED 08/12/2012 15:09

## 2014-12-13 NOTE — Discharge Summary (Signed)
PATIENT NAME:  Christine Stone, Christine Stone MR#:  [id number] DATE OF BIRTH:  08/23/1926  DATE OF ADMISSION:  07/31/2012 DATE OF DISCHARGE:  08/03/2012  ADDENDUM  Patient will need to be discharged with some oxygen secondary to atelectasis. She will be discharged on nasal cannula at 2 liters to titrate to effect. She also needs incentive spirometer every three hours.   ____________________________ Donell Beers. Benjie Karvonen, MD spm:cms D: 08/03/2012 12:56:37 ET T: 08/03/2012 12:59:22 ET JOB#: [id number]  cc: Samanda Buske P. Benjie Karvonen, MD, <Dictator> Donell Beers Karenna Romanoff MD ELECTRONICALLY SIGNED 08/04/2012 15:19

## 2014-12-13 NOTE — Consult Note (Signed)
PATIENT NAME:  Christine Stone, Christine Stone MR#:  [id number] DATE OF BIRTH:  [birth year]  DATE OF CONSULTATION:  08/01/2012  REFERRING PHYSICIAN:   CONSULTING PHYSICIAN:  Maebelle Munroe, MD  CHIEF COMPLAINT: Left hip pain.   HISTORY OF PRESENT ILLNESS: Christine Stone is an 81 year old female who sustained a fall last evening while emptying some ashes. She denied any loss of consciousness. She was brought to [facility] where she was diagnosed with a left pelvic ring injury for which I was asked to provide consultation.   She complains of sharp pain predominantly which is intermittent over the left buttock region. No antecedent hip pain.   REVIEW OF SYSTEMS: NEUROLOGIC: No loss of consciousness. CARDIAC: History of atrial fibrillation. No chest pain. RESPIRATORY: No shortness of breath. SKIN: Laceration over the left elbow. MUSCULOSKELETAL: Complaints of left hip pain predominantly in the buttock.   PAST MEDICAL HISTORY: Remarkable for diabetes, atrial fibrillation, hypothyroidism, chronic obstructive pulmonary disease, and PMR.   PAST SURGICAL HISTORY: Remarkable for cataract, hysterectomy, dental surgery, vein stripping, appendectomy, and tonsillectomy.   ALLERGIES: Sulfa.  CURRENT MEDICATIONS:  Levoxyl, Coumadin, prednisone, and occasional Tylenol.   SOCIAL HISTORY: Lives at home with her husband. Nonsmoker, no alcohol use.  FAMILY HISTORY: Positive for renal carcinoma and atherosclerotic coronary artery disease.   PHYSICAL EXAMINATION:  GENERAL:  Pleasant, alert, elderly female appearing her stated age, presenting with her husband and sons.   VITAL SIGNS: On presentation to the ward include temperature 98.2, pulse 92, respirations are 20. Blood pressure of 166/78, pulse oximetry 91% on room air.   HEENT:  Normocephalic, atraumatic. Sclerae clear. Oral mucosal membranes are slightly dry.   LUNGS: Clear to auscultation bilaterally.   HEART: Irregular rhythm, normal rate, 2/6  systolic ejection murmur.   ABDOMEN: Soft, nontender, nondistended. Positive bowel sounds.   LYMPHATIC: No swelling left lower extremity.  NEUROLOGIC:  Motor and light touch sensation examination intact.   LEFT LOWER EXTREMITY: Vascular examination: 2+ dorsalis pedis and posterior tibialis pulse.   SKIN: Examination showing the skin over the left hip region warm and intact. There is a 3 x 3 cm superficial loss of skin/abrasion over the left olecranon region.   MUSCULOSKELETAL: Examination reveals full passive range of motion in the left and right upper extremities without pain including left elbow examination flexion, extension, and supination, pronation.  see left elbow skin exam  PELVIS: Examination of the left pelvis reveals that the pelvis is stable to AP and lateral compression. There is moderate tenderness to palpation over the left SI joint region. There is also tenderness to palpation in the left groin. There is no pain with left lower extremity or right lower extremity log roll maneuver.   Radiographs of the left pelvis demonstrate mildly displaced comminuted superior and inferior pubic rami fracture on the left. CT examination is consistent with plain radiographs, showing comminuted superior and inferior pubic rami fractures on the left and a small left  sacral alar buckle fracture.   IMPRESSION: Minimally displaced comminuted closed left anterior and posterior pelvic ring injury.   PLAN: TEDs and sequentials bilateral lower extremities. Coumadin and pain control per the hospitalist service. Weight-bearing as tolerated to the left lower extremity. Follow up in the office with Dr. Christophe Louis in approximately 10 to 14 days or sooner if questions, concerns, or problems. I will be signing off this case.  Call me if there are further questions. Thank you for the consult.    ____________________________ Fulton Mole.  Mardi Mainland, MD jfs:bjt D: 08/01/2012 12:57:59 ET T: 08/01/2012  13:17:47 ET JOB#: [id number]  cc: Maebelle Munroe, MD, <Dictator> Maebelle Munroe MD ELECTRONICALLY SIGNED 08/01/2012 19:13

## 2014-12-14 ENCOUNTER — Ambulatory Visit: Payer: Medicare Other | Admitting: Cardiovascular Disease

## 2014-12-17 NOTE — Discharge Summary (Signed)
PATIENT NAME:  Christine Stone, Christine Stone MR#:  [id number] DATE OF BIRTH:  [birth year]  DATE OF ADMISSION:  08/24/2013 DATE OF DISCHARGE:  08/30/2013  For a detailed note, please take a look at the history and physical done on admission by Dr. Bridgette Habermann.     DISCHARGE DIAGNOSIS: As follows:  1.  Chronic obstructive pulmonary disease exacerbation secondary to acute bronchitis.  2.  Acute bronchitis.  3.  Hypertension.   4.  History of chronic atrial fibrillation.  5.  Hypothyroidism.   DIET: The patient is being discharged on a low-sodium diet.   ACTIVITY: As tolerated.   FOLLOWUP: Dr. Damita Dunnings at [facility] in the next 1 to 2 weeks. The patient is being discharged home with home health physical therapy and nursing services.   DISCHARGE MEDICATIONS: Synthroid 88 mcg daily, warfarin 5 mg daily, Coreg 6.25 mg b.i.d., Symbicort 160/4.5 two puffs b.i.d. Spiriva 1 puff daily, prednisone taper starting at 50 mg down to 10 mg over the next 10 days.   PERTINENT STUDIES DONE DURING THE HOSPITAL COURSE: Are as follows: Chest x-ray done on December 30th showing cardiac enlargement, emphysematous changes and chronic bronchitic changes in the lungs. No new acute cardiopulmonary disease.   HOSPITAL COURSE: This is an 81 year old female with medical problems as mentioned above, presented to the hospital on August 24, 2013, due to shortness of breath and cough, and noted to be in COPD exacerbation.  1.  COPD exacerbation. This was likely a result of acute bronchitis which was slow to improve. Initially, the patient was just placed on a prednisone taper but was not improving, therefore, was started on IV steroids, around-the-clock nebulizer treatments, also started on some Symbicort and Spiriva. She was also given some Pulmicort nebulizers and started on some Mucinex. After aggressive therapy, the patient has significantly improved since admission. She is also less bronchospastic. She was ambulated on room air, did not  desaturate below 91%; therefore, did not qualify for home oxygen. Since clinically she is improved, she is being discharged on an oral prednisone taper along with her Spiriva and Advair.  2.  Acute bronchitis. The patient was treated adequately with oral Levaquin in the hospital for 5 days. She does not require any further antibiotic therapy as she is clinically feeling much better.  3.  Chronic atrial fibrillation. The patient remained rate controlled on her Coreg, which she will continue. She was also maintained on her Coumadin and her INR was therapeutic.  4.  Acute renal failure. This was likely secondary to dehydration and poor p.o. intake. The patient was hydrated with IV fluids and her renal function is now back to baseline. This further needs to be followed by her primary care physician.  5.  Generalized weakness. This was likely secondary to underlying COPD.  She was seen by physical therapy and they recommended home health services which was arranged for her prior to discharge.  6.  Congestive heart failure. Clinically, patient did not appear to have congestive heart failure. Most of her respiratory symptoms were related to COPD.  She probably has underlying diastolic dysfunction secondary to her COPD. She did have an echocardiogram done which showed an EF of 65%. At this point, she is not being discharged on any diuretics.  7.  CODE STATUS: The patient is a full code.   TIME SPENT ON DISCHARGE: 40 minutes.    ____________________________ Belia Heman. Verdell Carmine, MD vjs:cs D: 08/30/2013 16:29:00 ET T: 08/30/2013 19:11:19 ET JOB#: [id number]  cc: Belia Heman.  Verdell Carmine, MD, <Dictator> Elveria Rising. Damita Dunnings, MD Henreitta Leber MD ELECTRONICALLY SIGNED 09/22/2013 11:19

## 2014-12-17 NOTE — H&P (Signed)
PATIENT NAME:  Christine Stone, Christine Stone MR#:  [id number] DATE OF BIRTH:  08-24-1926  DATE OF ADMISSION:  08/24/2013  REFERRING PHYSICIAN: Dr. Dahlia Client.   PRIMARY CARE PHYSICIAN: Dr. Damita Dunnings at [facility].   CHIEF COMPLAINT: Shortness of breath.   HISTORY OF PRESENT ILLNESS: The patient is a pleasant 81 year old female with COPD, A-fib, and hypothyroidism who presents with shortness of breath, cough, and congestion for the past 3 to 4 days. The patient feels no fevers or chills, but has had significant wheezing. For the last 3 nights she states she has difficulty sleeping as she cannot breathe easily. She has a significant amount of cough which is dry, nonproductive. She states that she was very coarse and very wheezy last night and came into the hospital. Here she was noted to have negative flu, no fever, but significant tachycardia and x-ray did not suggest pneumonia. She also had an x-ray showing no significant pneumonia, but has received multiple nebs, steroids, and azithromycin and is still borderline hypoxic with O2 sats in the low 90s with decreased air movement and hospitalist services were contacted for further evaluation and management.   PAST MEDICAL HISTORY: 1.  Diabetes, which is diet controlled.  2.  Chronic A-fib.  3.  Hypothyroidism. 4.  COPD. 5.  Polymyalgia rheumatica.   PAST SURGICAL HISTORY:  1.  Tonsillectomy. 2.  Appendectomy. 3.  Vein stripping. 4.  Hysterectomy. 5.  Dental surgery. 6.  Cataract.   ALLERGIES: SULFA.   OUTPATIENT MEDICATIONS: 1.  Coumadin 5 mg once a day. 2.  Carvedilol 6.25 mg 2 times a day. 3.  Levothyroxine 88 mcg daily.   SOCIAL HISTORY: Quit smoking multiple years ago. Lives with husband who still smokes. No alcohol. Used to work for [employer].   FAMILY HISTORY: Father had thrombosis. Mom died at age 61. Brother had MI at age 46. Brother had kidney cancer.   REVIEW OF SYSTEMS: CONSTITUTIONAL: Positive for weakness and poor  appetite, but no fevers or chills.  ENT: Denies tinnitus or hearing loss.  EYES: Denies blurry vision or double vision.  RESPIRATORY: Positive for a dry cough, wheezing, shortness of breath, and dyspnea on exertion. No painful respirations. Has history of COPD.  CARDIOVASCULAR: No chest pain. No swelling in the legs. No palpitations but has A-fib. GASTROINTESTINAL: No nausea, vomiting, or diarrhea. Perhaps a little constipation. No abdominal pain or bloody or dark stools.  GENITOURINARY: Denies dysuria or hematuria, but has had multiple UTIs in the past. HEME AND LYMPH: No anemia or easy bruising.  SKIN: No rashes.  MUSCULOSKELETAL: Denies arthritis or gout.  NEUROLOGIC: Denies focal weakness or numbness.  PSYCHIATRIC: Denies anxiety or insomnia.   PHYSICAL EXAMINATION: VITAL SIGNS: On arrival temperature was 98.3, pulse rate 93, currently is low 100s and in A-fib, respiratory rate 22, blood pressure 134/78, and O2 sat 100% on oxygen, but after treatment it is about 90 to 91% on room air.  GENERAL: The patient is an elderly female sitting in bed with audible wheezing and congestion in the chest without stethoscope.  HEENT: Normocephalic, atraumatic. Pupils are equal and reactive. Somewhat dry mucous membranes.  NECK: Supple. No thyroid tenderness. No cervical lymphadenopathy.  CARDIOVASCULAR: S1 and S2, irregularly irregular and tachycardic. No significant murmurs could be appreciated, but the lung sounds overshadow the heart sounds. LUNGS: Bilateral diffuse coarse wheezing in all lung fields and decreased air movement.  ABDOMEN: Soft, nontender, and nondistended, a little firm. Positive bowel sounds all quadrants.  EXTREMITIES: No pitting  edema.  NEUROLOGIC: Cranial nerves II through XII grossly intact. Strength is 5 out of 5 in all extremities. Sensation intact to light touch.  PSYCHIATRIC: Awake, alert, and oriented x3.  SKIN: No obvious rashes.  LABORATORY AND DIAGNOSTICS: Glucose 115,  BUN 24, creatinine 1.42, sodium 134, potassium 3.4. LFTs shows AST of 42, otherwise within normal limits. First troponin was 0.06, second was 0.05. CK-MB and CK total within normal limits. WBC 6.5, hemoglobin 14.4, and platelets 121. Rapid flu was negative.   EKG: A-fib, rate of 95. No acute ST elevations or depressions.   X-ray of the chest: Cardiac enlargement, emphysema, and chronic bronchitis changes in the lungs. No evidence of acute cardiopulmonary disease.   ASSESSMENT AND PLAN: We have a pleasant 81 year old with history of polymyalgia rheumatica, chronic obstructive pulmonary disease, and chronic atrial fibrillation who presents with systemic inflammatory response syndrome criteria of tachycardia, tachypnea, possible sepsis from acute bronchitis, and acute chronic obstructive pulmonary exacerbation. I would start the patient on around-the-clock nebulizers, oxygen, and Levaquin at this point, see what the sputum cultures show, and start the patient on some Robitussin for her cough. She has received multiple nebulizers and a dose of 125 mg of Solu-Medrol here and is still pretty congested and has coarse wheezing.   I would continue the warfarin, but check a PT/ INR to see what the INR is. I would check a TSH and continue the levothyroxine for hypothyroidism. In regards to the atrial fibrillation, this is rate controlled overall it appears with Coreg, but at this point heart rate is in the low 100s and I suspect that the lungs are driving the heart rate up. I would continue to Coreg, monitor her on a remote telemetry bed. I would start her on proton pump inhibitor for gastrointestinal prophylaxis. The patient is FULL code.   TOTAL TIME SPENT: 45 minutes.  ____________________________ Vivien Presto, MD sa:sb D: 08/24/2013 09:29:11 ET T: 08/24/2013 09:56:33 ET JOB#: [id number]  cc: Vivien Presto, MD, <Dictator> Elveria Rising. Damita Dunnings, MD Vivien Presto MD ELECTRONICALLY SIGNED 09/07/2013 11:29

## 2015-03-17 ENCOUNTER — Other Ambulatory Visit
Admission: RE | Admit: 2015-03-17 | Discharge: 2015-03-17 | Disposition: A | Discharge status: Home / Self Care | Payer: Medicare Other | Source: Ambulatory Visit | Attending: Nurse Practitioner | Admitting: Nurse Practitioner

## 2015-03-17 DIAGNOSIS — R319 Hematuria, unspecified: Secondary | ICD-10-CM | POA: Diagnosis present

## 2015-03-17 LAB — URINALYSIS COMPLETE WITH MICROSCOPIC (ARMC ONLY)
Bilirubin Urine: NEGATIVE
Glucose, UA: NEGATIVE mg/dL
Hgb urine dipstick: NEGATIVE
KETONES UR: NEGATIVE mg/dL
Nitrite: NEGATIVE
PH: 7 (ref 5.0–8.0)
Protein, ur: NEGATIVE mg/dL
SPECIFIC GRAVITY, URINE: 1.015 (ref 1.005–1.030)

## 2015-03-18 LAB — URINE CULTURE

## 2015-03-21 ENCOUNTER — Other Ambulatory Visit
Admission: RE | Admit: 2015-03-21 | Discharge: 2015-03-21 | Disposition: A | Discharge status: Home / Self Care | Payer: Medicare Other | Source: Ambulatory Visit | Attending: Nurse Practitioner | Admitting: Nurse Practitioner

## 2015-03-21 DIAGNOSIS — R319 Hematuria, unspecified: Secondary | ICD-10-CM | POA: Diagnosis present

## 2015-03-21 LAB — COMPREHENSIVE METABOLIC PANEL
ALK PHOS: 53 U/L (ref 38–126)
ALT: 8 U/L — ABNORMAL LOW (ref 14–54)
ANION GAP: 9 (ref 5–15)
AST: 18 U/L (ref 15–41)
Albumin: 3.9 g/dL (ref 3.5–5.0)
BUN: 39 mg/dL — ABNORMAL HIGH (ref 6–20)
CALCIUM: 9.3 mg/dL (ref 8.9–10.3)
CHLORIDE: 99 mmol/L — AB (ref 101–111)
CO2: 26 mmol/L (ref 22–32)
CREATININE: 1.69 mg/dL — AB (ref 0.44–1.00)
GFR calc Af Amer: 30 mL/min — ABNORMAL LOW (ref >60)
GFR, EST NON AFRICAN AMERICAN: 26 mL/min — AB (ref >60)
Glucose, Bld: 124 mg/dL — ABNORMAL HIGH (ref 65–99)
Potassium: 4.9 mmol/L (ref 3.5–5.1)
Sodium: 134 mmol/L — ABNORMAL LOW (ref 135–145)
Total Bilirubin: 0.4 mg/dL (ref 0.3–1.2)
Total Protein: 7.1 g/dL (ref 6.5–8.1)

## 2015-03-21 LAB — CBC
HEMATOCRIT: 35.3 % (ref 35.0–47.0)
Hemoglobin: 11.7 g/dL — ABNORMAL LOW (ref 12.0–16.0)
MCH: 28.9 pg (ref 26.0–34.0)
MCHC: 33.1 g/dL (ref 32.0–36.0)
MCV: 87.4 fL (ref 80.0–100.0)
Platelets: 178 10*3/uL (ref 150–440)
RBC: 4.04 MIL/uL (ref 3.80–5.20)
RDW: 13.9 % (ref 11.5–14.5)
WBC: 7.2 10*3/uL (ref 3.6–11.0)

## 2015-03-23 ENCOUNTER — Other Ambulatory Visit
Admission: RE | Admit: 2015-03-23 | Discharge: 2015-03-23 | Disposition: A | Discharge status: Home / Self Care | Payer: Medicare Other | Source: Ambulatory Visit | Attending: Nurse Practitioner | Admitting: Nurse Practitioner

## 2015-03-23 DIAGNOSIS — R410 Disorientation, unspecified: Secondary | ICD-10-CM | POA: Insufficient documentation

## 2015-03-23 LAB — URINALYSIS COMPLETE WITH MICROSCOPIC (ARMC ONLY)
GLUCOSE, UA: NEGATIVE mg/dL
HGB URINE DIPSTICK: NEGATIVE
KETONES UR: NEGATIVE mg/dL
NITRITE: POSITIVE — AB
PROTEIN: 30 mg/dL — AB
RBC / HPF: NONE SEEN RBC/hpf (ref <3)
SPECIFIC GRAVITY, URINE: 1.015 (ref 1.005–1.030)
pH: 5.5 (ref 5.0–8.0)

## 2015-03-25 LAB — URINE CULTURE

## 2015-06-06 ENCOUNTER — Other Ambulatory Visit
Admission: RE | Admit: 2015-06-06 | Discharge: 2015-06-06 | Disposition: A | Discharge status: Home / Self Care | Payer: Medicare Other | Source: Ambulatory Visit | Attending: Nurse Practitioner | Admitting: Nurse Practitioner

## 2015-06-06 DIAGNOSIS — I482 Chronic atrial fibrillation: Secondary | ICD-10-CM | POA: Diagnosis present

## 2015-06-06 LAB — COMPREHENSIVE METABOLIC PANEL
ALT: 8 U/L — AB (ref 14–54)
AST: 20 U/L (ref 15–41)
Albumin: 3.9 g/dL (ref 3.5–5.0)
Alkaline Phosphatase: 64 U/L (ref 38–126)
Anion gap: 8 (ref 5–15)
BUN: 28 mg/dL — AB (ref 6–20)
CHLORIDE: 100 mmol/L — AB (ref 101–111)
CO2: 27 mmol/L (ref 22–32)
Calcium: 9.1 mg/dL (ref 8.9–10.3)
Creatinine, Ser: 1.93 mg/dL — ABNORMAL HIGH (ref 0.44–1.00)
GFR calc non Af Amer: 22 mL/min — ABNORMAL LOW (ref >60)
GFR, EST AFRICAN AMERICAN: 26 mL/min — AB (ref >60)
Glucose, Bld: 106 mg/dL — ABNORMAL HIGH (ref 65–99)
POTASSIUM: 5.3 mmol/L — AB (ref 3.5–5.1)
Sodium: 135 mmol/L (ref 135–145)
Total Bilirubin: 0.4 mg/dL (ref 0.3–1.2)
Total Protein: 7.2 g/dL (ref 6.5–8.1)

## 2015-06-06 LAB — CBC
HCT: 35.7 % (ref 35.0–47.0)
Hemoglobin: 11.5 g/dL — ABNORMAL LOW (ref 12.0–16.0)
MCH: 28.2 pg (ref 26.0–34.0)
MCHC: 32.3 g/dL (ref 32.0–36.0)
MCV: 87.4 fL (ref 80.0–100.0)
PLATELETS: 175 10*3/uL (ref 150–440)
RBC: 4.08 MIL/uL (ref 3.80–5.20)
RDW: 14.3 % (ref 11.5–14.5)
WBC: 8.1 10*3/uL (ref 3.6–11.0)

## 2015-06-08 LAB — TSH: TSH: 5.451 u[IU]/mL — ABNORMAL HIGH (ref 0.350–4.500)

## 2015-07-31 ENCOUNTER — Ambulatory Visit (INDEPENDENT_AMBULATORY_CARE_PROVIDER_SITE_OTHER): Payer: Medicare Other | Admitting: Cardiovascular Disease

## 2015-07-31 ENCOUNTER — Encounter: Payer: Self-pay | Admitting: Cardiovascular Disease

## 2015-07-31 VITALS — BP 112/58 | HR 72 | Ht 63.0 in | Wt 128.5 lb

## 2015-07-31 DIAGNOSIS — I10 Essential (primary) hypertension: Secondary | ICD-10-CM | POA: Diagnosis not present

## 2015-07-31 DIAGNOSIS — I350 Nonrheumatic aortic (valve) stenosis: Secondary | ICD-10-CM

## 2015-07-31 DIAGNOSIS — I4891 Unspecified atrial fibrillation: Secondary | ICD-10-CM

## 2015-07-31 DIAGNOSIS — R42 Dizziness and giddiness: Secondary | ICD-10-CM

## 2015-07-31 DIAGNOSIS — I951 Orthostatic hypotension: Secondary | ICD-10-CM

## 2015-07-31 DIAGNOSIS — E785 Hyperlipidemia, unspecified: Secondary | ICD-10-CM

## 2015-07-31 DIAGNOSIS — R0602 Shortness of breath: Secondary | ICD-10-CM

## 2015-07-31 DIAGNOSIS — R531 Weakness: Secondary | ICD-10-CM

## 2015-07-31 NOTE — Assessment & Plan Note (Signed)
Moderate aortic valve stenosis on her prior clinic visit. Very prominent murmur noted. We did spend some time today talking about TAVR and other procedures for her valve. She is not interested at this time

## 2015-07-31 NOTE — Assessment & Plan Note (Signed)
Currently walks with a walker, unsteady gait. Muscle loss in her legs

## 2015-07-31 NOTE — Patient Instructions (Signed)
You are doing well. No medication changes were made.  You have significant aortic valve stenosis This can cause shortness of breath, dizziness  Please call us if you have new issues that need to be addressed before your next appt.  Your physician wants you to follow-up in: 6 months.  You will receive a reminder letter in the mail two months in advance. If you don't receive a letter, please call our office to schedule the follow-up appointment.

## 2015-07-31 NOTE — Progress Notes (Signed)
Patient ID: Christine Stone, female    DOB: March 14, 1926, 81 y.o.   MRN: [id number]  HPI Comments: Christine Stone is a 81 year old woman with history of aortic valve stenosis, hard of hearing,  hypertension, COPD , prior smoking history , diabetes , chronic atrial fibrillation on anticoagulation , who presents for routine follow-up of her aortic valve stenosis Previous echocardiogram showing moderate aortic valve stenosis  In follow-up today, she reports having episodes of dizziness. These present at rest or with standing, Whenever she has them she typically has to sit down and wait until a goal way. Symptoms could last for quite some time, activity does not seem to change her symptoms. Unclear if she is getting spinning in her head but she does not think they are vertigo. Nursing home giving her meclizine. Nursing home was checking her blood pressure at times, now not consistently. She had mild orthostasis on her last clinic visit, drop in systolic pressure from 0000000 supine down to 127 standing that improved back to 0000000 systolic after several minutes. Lisinopril previously held for low blood pressure. She is trying to drink more fluids  Long discussion today concerning her aortic valve. She does not want further workup She does not want even to be considered for surgery. She thinks her son would want her to have surgery, she does not want this "Parts are wearing out"  EKG on today's visit shows atrial fibrillation with ventricular rate 72 bpm, no significant ST or T-wave changes  Other past medical history Notes indicate long history of atrial fibrillation with rate control, previously on warfarin, now on eliquis  5 mg twice a day.  Hospital admission January 2015 for COPD exacerbation, acute bronchitis treated with prednisone and antibiotics  Previous echocardiogram January 2015 showing mild to moderate aortic valve stenosis, low normal ejection fraction 50-55%, mild LVH, normal right  ventricular systolic pressure lab work 06/05/2014 showing creatinine 1.69, BUN 26  Prior history of chronic cough. She feels that he has been worse this month. Of note amlodipine was held at the beginning of October and she was changed to lisinopril.     Allergies  Allergen Reactions  . Loratadine     REACTION: makes head feel funny  . Other     Allergic to bandaid adhesive- local recation  . Sulfasalazine   . Tape     Local reaction to adhesive.      Outpatient Encounter Prescriptions as of 07/31/2015  Medication Sig  . budesonide-formoterol (SYMBICORT) 160-4.5 MCG/ACT inhaler Inhale 2 puffs into the lungs 2 (two) times daily.  . carvedilol (COREG) 12.5 MG tablet Take 12.5 mg by mouth 2 (two) times daily with a meal.   . Cetirizine HCl (ZYRTEC ALLERGY) 10 MG CAPS Take 10 mg by mouth daily.  Marland Kitchen ELIQUIS 5 MG TABS tablet Take 5 mg by mouth 2 (two) times daily.   Marland Kitchen levothyroxine (SYNTHROID, LEVOTHROID) 88 MCG tablet Take 88 mcg by mouth daily before breakfast.  . meclizine (ANTIVERT) 25 MG tablet Take 25 mg by mouth daily.  . Melatonin 3 MG CAPS Take 3 mg by mouth at bedtime.  . metFORMIN (GLUCOPHAGE) 500 MG tablet Take 500 mg by mouth 2 (two) times daily with a meal.   . nystatin (MYCOSTATIN/NYSTOP) 100000 UNIT/GM POWD 2 (two) times daily.   Marland Kitchen omeprazole (PRILOSEC) 20 MG capsule Take 20 mg by mouth daily.  Marland Kitchen SPIRIVA HANDIHALER 18 MCG inhalation capsule Place 18 mcg into inhaler and inhale daily.   Marland Kitchen spironolactone (  ALDACTONE) 25 MG tablet Take 12.5 mg by mouth daily.  . [DISCONTINUED] lisinopril (PRINIVIL,ZESTRIL) 2.5 MG tablet Take 2.5 mg by mouth daily.   No facility-administered encounter medications on file as of 07/31/2015.    Past Medical History  Diagnosis Date  . Atrial fibrillation ([place])     On coumadin   . CHF (congestive heart failure) ([facility])     Diastolic -- echo 0000000 EF [id number] diastolic dysfunction mild AS   . Hyperlipidemia   . Hypothyroidism   . Diabetes  mellitus     Type II   . GERD (gastroesophageal reflux disease)     Sleep study   . Chest pain     R/Od A. fib   . Syncope 05-22-08 - 05/23/08    Hosp [facility] syncope R/o'd ARI  . PMR (polymyalgia rheumatica) ([facility])   . Fracture of multiple pubic rami [facility])     [place] 2013  . Hypertension   . COPD (chronic obstructive pulmonary disease) ([place])   . Depression   . Idiopathic peripheral neuropathy [facility])     Past Surgical History  Procedure Laterality Date  . Appendectomy  1945  . Partial hysterectomy      Ovaries intact   . Pleural scarification      Pleural Fluid B9    Social History  reports that she quit smoking about 24 years ago. Her smoking use included Cigarettes. She has a 20 pack-year smoking history. She has never used smokeless tobacco. She reports that she does not drink alcohol or use illicit drugs.  Family History family history includes Cancer in her brother and sister; Coronary artery disease in her father; Heart failure in her sister; Hypertension in her sister; Thrombosis (age of onset: 61) in her father.   Review of Systems  HENT: Positive for hearing loss.   Respiratory: Positive for shortness of breath.   Cardiovascular: Negative.   Gastrointestinal: Negative.   Musculoskeletal: Positive for gait problem.  Skin: Negative.   Allergic/Immunologic: Negative.   Neurological: Positive for dizziness and weakness.  Hematological: Negative.   Psychiatric/Behavioral: Negative.   All other systems reviewed and are negative.   BP 112/58 mmHg  Pulse 72  Ht 5\' 3"  (1.6 m)  Wt 128 lb 8 oz (58.287 kg)  BMI 22.77 kg/m2  Physical Exam  Constitutional: She is oriented to person, place, and time. She appears well-developed and well-nourished.  HENT:  Head: Normocephalic.  Nose: Nose normal.  Mouth/Throat: Oropharynx is clear and moist.  Eyes: Conjunctivae are normal. Pupils are equal, round, and reactive to light.  Neck: Normal range of motion. Neck supple. No JVD  present.  Cardiovascular: Normal rate, S1 normal, S2 normal and intact distal pulses.  An irregularly irregular rhythm present. Exam reveals no gallop and no friction rub.   Murmur heard.  Systolic murmur is present with a grade of 2/6  Pulmonary/Chest: Effort normal and breath sounds normal. No respiratory distress. She has no wheezes. She has no rales. She exhibits no tenderness.  Abdominal: Soft. Bowel sounds are normal. She exhibits no distension. There is no tenderness.  Musculoskeletal: Normal range of motion. She exhibits no edema or tenderness.  Lymphadenopathy:    She has no cervical adenopathy.  Neurological: She is alert and oriented to person, place, and time. Coordination normal.  Skin: Skin is warm and dry. No rash noted. No erythema.  Psychiatric: She has a normal mood and affect. Her behavior is normal. Judgment and thought content normal.    Assessment and  Plan  Nursing note and vitals reviewed.

## 2015-07-31 NOTE — Assessment & Plan Note (Signed)
No recent lipid panel available. We'll discuss this with her on her next visit to determine if she would like to have this monitored Several years ago total cholesterol 230

## 2015-07-31 NOTE — Assessment & Plan Note (Signed)
She did report having lightheaded symptoms in the past. We'll again request blood pressures when she has symptoms. Encouraged her to increase her fluid intake Difficult to decrease the Coreg dosing as this would likely end up causing higher heart rate and in the setting of aortic valve stenosis, would not be ideal

## 2015-07-31 NOTE — Assessment & Plan Note (Signed)
We have called the nursing home, had to leave a message as no one was available. We were inquiring about her blood pressures In the order form to the nursing home, we have placed her request to have her blood pressure checked when she has lightheaded/dizzy symptoms. Unclear if this is orthostasis or if she is having symptoms from her aortic valve stenosis No medication changes made at this time

## 2015-07-31 NOTE — Assessment & Plan Note (Signed)
Heart rate adequate on carvedilol Blood pressure on my check was [id number] systolic No medication changes made. She is tolerating anticoagulation

## 2015-07-31 NOTE — Assessment & Plan Note (Signed)
Etiology of her Shortness of breath is likely multifactorial Includes aortic valve stenosis, underlying lung disease, deconditioning

## 2015-09-09 ENCOUNTER — Encounter: Payer: Self-pay | Admitting: Emergency Medicine

## 2015-09-09 ENCOUNTER — Emergency Department: Payer: Medicare Other

## 2015-09-09 ENCOUNTER — Emergency Department
Admission: EM | Admit: 2015-09-09 | Discharge: 2015-09-09 | Disposition: A | Discharge status: Home / Self Care | Payer: Medicare Other | Attending: Emergency Medicine | Admitting: Emergency Medicine

## 2015-09-09 DIAGNOSIS — Z87891 Personal history of nicotine dependence: Secondary | ICD-10-CM | POA: Diagnosis not present

## 2015-09-09 DIAGNOSIS — N184 Chronic kidney disease, stage 4 (severe): Secondary | ICD-10-CM | POA: Diagnosis not present

## 2015-09-09 DIAGNOSIS — I129 Hypertensive chronic kidney disease with stage 1 through stage 4 chronic kidney disease, or unspecified chronic kidney disease: Secondary | ICD-10-CM | POA: Diagnosis not present

## 2015-09-09 DIAGNOSIS — R1310 Dysphagia, unspecified: Secondary | ICD-10-CM

## 2015-09-09 DIAGNOSIS — K2100 Gastro-esophageal reflux disease with esophagitis, without bleeding: Secondary | ICD-10-CM

## 2015-09-09 DIAGNOSIS — M79671 Pain in right foot: Secondary | ICD-10-CM | POA: Insufficient documentation

## 2015-09-09 DIAGNOSIS — E119 Type 2 diabetes mellitus without complications: Secondary | ICD-10-CM | POA: Diagnosis not present

## 2015-09-09 LAB — CBC WITH DIFFERENTIAL/PLATELET
BASOS ABS: 0 10*3/uL (ref 0–0.1)
Basophils Relative: 0 %
EOS PCT: 1 %
Eosinophils Absolute: 0.1 10*3/uL (ref 0–0.7)
HCT: 35.3 % (ref 35.0–47.0)
Hemoglobin: 11.5 g/dL — ABNORMAL LOW (ref 12.0–16.0)
Lymphocytes Relative: 12 %
Lymphs Abs: 1.3 10*3/uL (ref 1.0–3.6)
MCH: 27.4 pg (ref 26.0–34.0)
MCHC: 32.5 g/dL (ref 32.0–36.0)
MCV: 84.3 fL (ref 80.0–100.0)
Monocytes Absolute: 0.9 10*3/uL (ref 0.2–0.9)
Monocytes Relative: 8 %
NEUTROS ABS: 8.6 10*3/uL — AB (ref 1.4–6.5)
Neutrophils Relative %: 79 %
PLATELETS: 231 10*3/uL (ref 150–440)
RBC: 4.19 MIL/uL (ref 3.80–5.20)
RDW: 14.7 % — ABNORMAL HIGH (ref 11.5–14.5)
WBC: 11 10*3/uL (ref 3.6–11.0)

## 2015-09-09 LAB — COMPREHENSIVE METABOLIC PANEL
ALBUMIN: 3.6 g/dL (ref 3.5–5.0)
ALT: 11 U/L — ABNORMAL LOW (ref 14–54)
AST: 18 U/L (ref 15–41)
Alkaline Phosphatase: 61 U/L (ref 38–126)
Anion gap: 11 (ref 5–15)
BUN: 39 mg/dL — AB (ref 6–20)
CHLORIDE: 102 mmol/L (ref 101–111)
CO2: 24 mmol/L (ref 22–32)
CREATININE: 1.97 mg/dL — AB (ref 0.44–1.00)
Calcium: 9.6 mg/dL (ref 8.9–10.3)
GFR calc Af Amer: 25 mL/min — ABNORMAL LOW (ref >60)
GFR calc non Af Amer: 22 mL/min — ABNORMAL LOW (ref >60)
GLUCOSE: 118 mg/dL — AB (ref 65–99)
POTASSIUM: 4.9 mmol/L (ref 3.5–5.1)
Sodium: 137 mmol/L (ref 135–145)
Total Bilirubin: 0.8 mg/dL (ref 0.3–1.2)
Total Protein: 7.5 g/dL (ref 6.5–8.1)

## 2015-09-09 LAB — LIPASE, BLOOD: Lipase: 48 U/L (ref 11–51)

## 2015-09-09 LAB — TROPONIN I: Troponin I: 0.03 ng/mL (ref <0.031)

## 2015-09-09 MED ORDER — OMEPRAZOLE 40 MG PO CPDR: 40.0000 mg | DELAYED_RELEASE_CAPSULE | Freq: Every day | ORAL | Status: DC

## 2015-09-09 MED ORDER — SODIUM CHLORIDE 0.9 % IV SOLN
1000.0000 mL | Freq: Once | INTRAVENOUS | Status: AC
Start: 2015-09-09 — End: 2015-09-09
  Administered 2015-09-09: 1000 mL via INTRAVENOUS

## 2015-09-09 MED ORDER — SUCRALFATE 1 G PO TABS: 1.0000 g | ORAL_TABLET | Freq: Four times a day (QID) | ORAL | Status: DC

## 2015-09-09 MED ORDER — LIDOCAINE VISCOUS 2 % MT SOLN: 20.0000 mL | OROMUCOSAL | Status: DC | PRN

## 2015-09-09 MED ORDER — PANTOPRAZOLE SODIUM 40 MG PO TBEC
40.0000 mg | DELAYED_RELEASE_TABLET | Freq: Once | ORAL | Status: AC
  Administered 2015-09-09: 40 mg via ORAL
  Filled 2015-09-09: qty 1

## 2015-09-09 MED ORDER — GI COCKTAIL ~~LOC~~
30.0000 mL | Freq: Once | ORAL | Status: AC
Start: 2015-09-09 — End: 2015-09-09
  Administered 2015-09-09: 30 mL via ORAL
  Filled 2015-09-09: qty 30

## 2015-09-09 NOTE — Discharge Instructions (Signed)
Dysphagia Swallowing problems (dysphagia) occur when solids and liquids seem to stick in your throat on the way down to your stomach, or the food takes longer to get to the stomach. Other symptoms include regurgitating food, noises coming from the throat, chest discomfort with swallowing, and a feeling of fullness or the feeling of something being stuck in your throat when swallowing. When blockage in your throat is complete, it may be associated with drooling. CAUSES  Problems with swallowing may occur because of problems with the muscles. The food cannot be propelled in the usual manner into your stomach. You may have ulcers, scar tissue, or inflammation in the tube down which food travels from your mouth to your stomach (esophagus), which blocks food from passing normally into the stomach. Causes of inflammation include:  Acid reflux from your stomach into your esophagus.  Infection.  Radiation treatment for cancer.  Medicines taken without enough fluids to wash them down into your stomach. You may have nerve problems that prevent signals from being sent to the muscles of your esophagus to contract and move your food down to your stomach. Globus pharyngeus is a relatively common problem in which there is a sense of an obstruction or difficulty in swallowing, without any physical abnormalities of the swallowing passages being found. This problem usually improves over time with reassurance and testing to rule out other causes. DIAGNOSIS Dysphagia can be diagnosed and its cause can be determined by tests in which you swallow a white substance that helps illuminate the inside of your throat (contrast medium) while X-rays are taken. Sometimes a flexible telescope that is inserted down your throat (endoscopy) to look at your esophagus and stomach is used. TREATMENT   If the dysphagia is caused by acid reflux or infection, medicines may be used.  If the dysphagia is caused by problems with your  swallowing muscles, swallowing therapy may be used to help you strengthen your swallowing muscles.  If the dysphagia is caused by a blockage or mass, procedures to remove the blockage may be done. HOME CARE INSTRUCTIONS  Try to eat soft food that is easier to swallow and check your weight on a daily basis to be sure that it is not decreasing.  Be sure to drink liquids when sitting upright (not lying down). SEEK MEDICAL CARE IF:  You are losing weight because you are unable to swallow.  You are coughing when you drink liquids (aspiration).  You are coughing up partially digested food. SEEK IMMEDIATE MEDICAL CARE IF:  You are unable to swallow your own saliva .  You are having shortness of breath or a fever, or both.  You have a hoarse voice along with difficulty swallowing. MAKE SURE YOU:  Understand these instructions.  Will watch your condition.  Will get help right away if you are not doing well or get worse.   This information is not intended to replace advice given to you by your health care provider. Make sure you discuss any questions you have with your health care provider.   Document Released: 08/09/2000 Document Revised: 09/02/2014 Document Reviewed: 01/29/2013 Elsevier Interactive Patient Education 2016 [employer]  Esophagitis Esophagitis is inflammation of the esophagus. The esophagus is the tube that carries food and liquids from your mouth to your stomach. Esophagitis can cause soreness or pain in the esophagus. This condition can make it difficult and painful to swallow.  CAUSES Most causes of esophagitis are not serious. Common causes of this condition include:  Gastroesophageal reflux  disease (GERD). This is when stomach contents move back up into the esophagus (reflux).  Repeated vomiting.  An allergic-type reaction, especially caused by food allergies (eosinophilic esophagitis).  Injury to the esophagus by swallowing large pills with or without  water, or swallowing certain types of medicines.  Swallowing (ingesting) harmful chemicals, such as household cleaning products.  Heavy alcohol use.  An infection of the esophagus.This most often occurs in people who have a weakened immune system.  Radiation or chemotherapy treatment for cancer.  Certain diseases such as sarcoidosis, Crohn disease, and scleroderma. SYMPTOMS Symptoms of this condition include:  Difficult or painful swallowing.  Pain with swallowing acidic liquids, such as citrus juices.  Pain with burping.  Chest pain.  Difficulty breathing.  Nausea.  Vomiting.  Pain in the abdomen.  Weight loss.  Ulcers in the mouth.  Patches of white material in the mouth (candidiasis).  Fever.  Coughing up blood or vomiting blood.  Stool that is black, tarry, or bright red. DIAGNOSIS Your health care provider will take a medical history and perform a physical exam. You may also have other tests, including:  An endoscopy to examine your stomach and esophagus with a small camera.  A test that measures the acidity level in your esophagus.  A test that measures how much pressure is on your esophagus.  A barium swallow or modified barium swallow to show the shape, size, and functioning of your esophagus.  Allergy tests. TREATMENT Treatment for this condition depends on the cause of your esophagitis. In some cases, steroids or other medicines may be given to help relieve your symptoms or to treat the underlying cause of your condition. You may have to make some lifestyle changes, such as:  Avoiding alcohol.  Quitting smoking.  Changing your diet.  Exercising.  Changing your sleep habits and your sleep environment. HOME CARE INSTRUCTIONS Take these actions to decrease your discomfort and to help avoid complications. Diet  Follow a diet as recommended by your health care provider. This may involve avoiding foods and drinks such as:  Coffee and tea  (with or without caffeine).  Drinks that contain alcohol.  Energy drinks and sports drinks.  Carbonated drinks or sodas.  Chocolate and cocoa.  Peppermint and mint flavorings.  Garlic and onions.  Horseradish.  Spicy and acidic foods, including peppers, chili powder, curry powder, vinegar, hot sauces, and barbecue sauce.  Citrus fruit juices and citrus fruits, such as oranges, lemons, and limes.  Tomato-based foods, such as red sauce, chili, salsa, and pizza with red sauce.  Fried and fatty foods, such as donuts, french fries, potato chips, and high-fat dressings.  High-fat meats, such as hot dogs and fatty cuts of red and white meats, such as rib eye steak, sausage, ham, and bacon.  High-fat dairy items, such as whole milk, butter, and cream cheese.  Eat small, frequent meals instead of large meals.  Avoid drinking large amounts of liquid with your meals.  Avoid eating meals during the 2-3 hours before bedtime.  Avoid lying down right after you eat.  Do not exercise right after you eat.  Avoid foods and drinks that seem to make your symptoms worse. General Instructions  Pay attention to any changes in your symptoms.  Take over-the-counter and prescription medicines only as told by your health care provider. Do not take aspirin, ibuprofen, or other NSAIDs unless your health care provider told you to do so.  If you have trouble taking pills, use a pill splitter to  decrease the size of the pill. This will decrease the chance of the pill getting stuck or injuring your esophagus on the way down. Also, drink water after you take a pill.  Do not use any tobacco products, including cigarettes, chewing tobacco, and e-cigarettes. If you need help quitting, ask your health care provider.  Wear loose-fitting clothing. Do not wear anything tight around your waist that causes pressure on your abdomen.  Raise (elevate) the head of your bed about 6 inches (15 cm).  Try to reduce  your stress, such as with yoga or meditation. If you need help reducing stress, ask your health care provider.  If you are overweight, reduce your weight to an amount that is healthy for you. Ask your health care provider for guidance about a safe weight loss goal.  Keep all follow-up visits as told by your health care provider. This is important. SEEK MEDICAL CARE IF:  You have new symptoms.  You have unexplained weight loss.  You have difficulty swallowing, or it hurts to swallow.  You have wheezing or a persistent cough.  Your symptoms do not improve with treatment.  You have frequent heartburn for more than two weeks. SEEK IMMEDIATE MEDICAL CARE IF:  You have severe pain in your arms, neck, jaw, teeth, or back.  You feel sweaty, dizzy, or light-headed.  You have chest pain or shortness of breath.  You vomit and your vomit looks like blood or coffee grounds.  Your stool is bloody or black.  You have a fever.  You cannot swallow, drink, or eat.   This information is not intended to replace advice given to you by your health care provider. Make sure you discuss any questions you have with your health care provider.   Document Released: 09/19/2004 Document Revised: 05/03/2015 Document Reviewed: 12/07/2014 Elsevier Interactive Patient Education [employer].

## 2015-09-09 NOTE — ED Provider Notes (Signed)
[facility] Emergency Department Provider Note     Time seen: ----------------------------------------- 9:35 AM on 09/09/2015 -----------------------------------------    I have reviewed the triage vital signs and the nursing notes.   HISTORY  Chief Complaint Chest Pain; Dysphagia; and Foot Pain    HPI Christine Stone is a 82 y.o. female who presents to ER being brought from assisted living with complaints of chest pain and decreased by mouth intake for 3 days. Patient states it's painful to swallow and she hasn't really had much to eat or drink for the last 3 days. Patient states it hurts when she swallows, also has had some right great toe pain from an ingrown nail. She is on doxycycline and has been on that for several days due to the right great toe infection. She denies other complaints at this time.   Past Medical History  Diagnosis Date  . Atrial fibrillation ([place])     On coumadin   . CHF (congestive heart failure) (HCC)     Diastolic -- echo 0000000 EF [id number] diastolic dysfunction mild AS   . Hyperlipidemia   . Hypothyroidism   . Diabetes mellitus     Type II   . GERD (gastroesophageal reflux disease)     Sleep study   . Chest pain     R/Od A. fib   . Syncope 05-22-08 - 05/23/08    H[facility] syncope R/o'd ARI  . PMR (polymyalgia rheumatica) (HCC)   . Fracture of multiple pubic rami [facility])     [place] 2013  . Hypertension   . COPD (chronic obstructive pulmonary disease) ([place])   . Depression   . Idiopathic peripheral neuropathy [facility])     Patient Active Problem List   Diagnosis Date Noted  . Orthostatic hypotension 10/06/2014  . Dizziness 06/16/2014  . Chronic cough 06/16/2014  . Living will on file at physician's office 03/12/2013  . SOB (shortness of breath) 03/04/2013  . Skin lesion 12/07/2012  . Back pain 09/24/2012  . Pubic ramus fracture ([place]) 08/06/2012  . Hematuria 07/03/2012  . Feeling weak 04/12/2012  . PMR (polymyalgia  rheumatica) (HCC) 02/11/2012  . AAA (abdominal aortic aneurysm) ([place]) 01/12/2012  . HYPERTENSION, BENIGN 02/21/2010  . Aortic valve stenosis 02/24/2009  . EMPHYSEMA 08/15/2008  . VASOVAGAL SYNCOPE 05/31/2008  . Chronic kidney disease, stage IV (severe) ([place]) 02/19/2008  . NEOPLASM UNCERTAIN BEHAVIOR PLEU THYMUS&MEDIAST 09/21/2007  . PULMONARY NODULE 09/21/2007  . ALLERGIC RHINITIS 02/25/2007  . HYPOTHYROIDISM 02/07/2007  . Hyperlipidemia 02/07/2007  . Diabetes mellitus type 2, controlled, without complications ([place]) [id number]  . ATRIAL FIBRILLATION -CHRONIC 02/06/2007  . CONGESTIVE HEART FAILURE 02/06/2007  . POSTMENOPAUSAL BLEEDING 02/06/2007    Past Surgical History  Procedure Laterality Date  . Appendectomy  1945  . Partial hysterectomy      Ovaries intact   . Pleural scarification      Pleural Fluid B9    Allergies Loratadine; Other; Sulfasalazine; and Tape  Social History Social History  Substance Use Topics  . Smoking status: Former Smoker -- 1.00 packs/day for 20 years    Types: Cigarettes    Quit date: 08/27/1990  . Smokeless tobacco: Never Used     Comment: 1/2 ppd x 40 years quit 1988.   Marland Kitchen Alcohol Use: No    Review of Systems Constitutional: Negative for fever. Eyes: Negative for visual changes. ENT: Negative for sore throat. Cardiovascular: Positive for chest pain Respiratory: Negative for shortness of breath. Gastrointestinal: Positive for pain with  swallowing, negative for vomiting and diarrhea Genitourinary: Negative for dysuria. Musculoskeletal: Negative for back pain. Skin: Negative for rash. Neurological: Negative for headaches, focal weakness or numbness.  10-point ROS otherwise negative.  ____________________________________________   PHYSICAL EXAM:  VITAL SIGNS: ED Triage Vitals  Enc Vitals Group     BP --      Pulse --      Resp --      Temp --      Temp src --      SpO2 --      Weight --      Height --      Head Cir --       Peak Flow --      Pain Score --      Pain Loc --      Pain Edu? --      Excl. in [place]? --     Constitutional: Alert and oriented. Well appearing and in no distress. Eyes: Conjunctivae are normal. PERRL. Normal extraocular movements. ENT   Head: Normocephalic and atraumatic.   Nose: No congestion/rhinnorhea.   Mouth/Throat: Mucous membranes are dry   Neck: No stridor. Cardiovascular: Normal rate, regular rhythm. Normal and symmetric distal pulses are present in all extremities. 3/6 systolic murmur. Respiratory: Normal respiratory effort without tachypnea nor retractions. Breath sounds are clear and equal bilaterally. No wheezes/rales/rhonchi. Gastrointestinal: Soft and nontender. No distention. No abdominal bruits.  Musculoskeletal: Nontender with normal range of motion in all extremities. No joint effusions.  No lower extremity tenderness nor edema. Neurologic:  Normal speech and language. No gross focal neurologic deficits are appreciated. Speech is normal. No gait instability. Skin:  Skin is warm, dry, mild erythema and scab formation around the right great toe medially Psychiatric: Mood and affect are normal. Speech and behavior are normal. Patient exhibits appropriate insight and judgment. ____________________________________________  EKG: Interpreted by me. Atrial fibrillation with a rate of 84 bpm, normal QRS, normal QT interval. Nonspecific ST-T wave changes.  ____________________________________________  ED COURSE:  Pertinent labs & imaging results that were available during my care of the patient were reviewed by me and considered in my medical decision making (see chart for details). Patient is in no acute distress, has odynophagia of unclear etiology. I will perform a upper GI and obtain basic labs. She is also dehydrated and will need IV fluid. ____________________________________________    LABS (pertinent positives/negatives)  Labs Reviewed  CBC WITH  DIFFERENTIAL/PLATELET - Abnormal; Notable for the following:    Hemoglobin 11.5 (*)    RDW 14.7 (*)    Neutro Abs 8.6 (*)    All other components within normal limits  COMPREHENSIVE METABOLIC PANEL - Abnormal; Notable for the following:    Glucose, Bld 118 (*)    BUN 39 (*)    Creatinine, Ser 1.97 (*)    ALT 11 (*)    GFR calc non Af Amer 22 (*)    GFR calc Af Amer 25 (*)    All other components within normal limits  LIPASE, BLOOD  TROPONIN I    RADIOLOGY Images were viewed by me  Chest CT  IMPRESSION: 1. Thickening of the wall of the distal esophagus with mild esophageal dilation. There is also mild haziness along the peripheral margin of the distal esophagus. Findings suggest esophagitis. There is no discrete esophageal mass. No evidence of obstruction. Contrast has passed from the distal esophagus and stomach. 2. No other evidence of an acute abnormality. 3. Emphysema. 4. Small lung  nodules. One in the right lower lobe measuring 4.4 mm has increased when compared to the prior CT. This is still likely a benign nodule. Follow-up recommended per Fleischner criteria. If the patient is at high risk for bronchogenic carcinoma, follow-up chest CT at 1 year is recommended. If the patient is at low risk, no follow-up is needed. This recommendation follows the consensus statement: Guidelines for Management of Small Pulmonary Nodules Detected on CT Scans: A Statement from the [place] as published in Radiology 2005; 237:395-400. ____________________________________________  FINAL ASSESSMENT AND PLAN  Odynophagia, esophagitis  Plan: Patient with labs and imaging as dictated above. Patient is being given a GI cocktail here. I will increase her Prilosec to 40 mg and add Carafate. She is advised increase her fluids intake and have close follow-up with GI for follow-up.   Earleen Newport, MD   Earleen Newport, MD 09/09/15 [phone]

## 2015-09-09 NOTE — ED Notes (Signed)
Patient from [facility] with complaint of chest pain with PO intake for 3 days. Also c/o foot pain (on doxy for same)

## 2015-09-25 ENCOUNTER — Telehealth: Payer: Self-pay | Admitting: *Deleted

## 2015-09-25 NOTE — Telephone Encounter (Signed)
Lmom to call our office for an echocardiogram (12 mth f/u).

## 2015-10-10 ENCOUNTER — Encounter: Payer: Self-pay | Admitting: *Deleted

## 2015-11-24 ENCOUNTER — Observation Stay
Admission: EM | Admit: 2015-11-24 | Discharge: 2015-11-27 | Payer: Medicare Other | Attending: Internal Medicine | Admitting: Internal Medicine

## 2015-11-24 ENCOUNTER — Emergency Department: Payer: Medicare Other

## 2015-11-24 ENCOUNTER — Encounter: Payer: Self-pay | Admitting: Emergency Medicine

## 2015-11-24 DIAGNOSIS — G609 Hereditary and idiopathic neuropathy, unspecified: Secondary | ICD-10-CM | POA: Diagnosis not present

## 2015-11-24 DIAGNOSIS — I7389 Other specified peripheral vascular diseases: Secondary | ICD-10-CM | POA: Insufficient documentation

## 2015-11-24 DIAGNOSIS — I714 Abdominal aortic aneurysm, without rupture, unspecified: Secondary | ICD-10-CM

## 2015-11-24 DIAGNOSIS — N950 Postmenopausal bleeding: Secondary | ICD-10-CM | POA: Diagnosis not present

## 2015-11-24 DIAGNOSIS — R258 Other abnormal involuntary movements: Secondary | ICD-10-CM

## 2015-11-24 DIAGNOSIS — I739 Peripheral vascular disease, unspecified: Secondary | ICD-10-CM | POA: Diagnosis not present

## 2015-11-24 DIAGNOSIS — Z7901 Long term (current) use of anticoagulants: Secondary | ICD-10-CM | POA: Insufficient documentation

## 2015-11-24 DIAGNOSIS — Z8673 Personal history of transient ischemic attack (TIA), and cerebral infarction without residual deficits: Secondary | ICD-10-CM | POA: Diagnosis not present

## 2015-11-24 DIAGNOSIS — I350 Nonrheumatic aortic (valve) stenosis: Secondary | ICD-10-CM | POA: Insufficient documentation

## 2015-11-24 DIAGNOSIS — E1122 Type 2 diabetes mellitus with diabetic chronic kidney disease: Secondary | ICD-10-CM | POA: Insufficient documentation

## 2015-11-24 DIAGNOSIS — M549 Dorsalgia, unspecified: Secondary | ICD-10-CM | POA: Diagnosis not present

## 2015-11-24 DIAGNOSIS — I5030 Unspecified diastolic (congestive) heart failure: Secondary | ICD-10-CM | POA: Diagnosis not present

## 2015-11-24 DIAGNOSIS — M8580 Other specified disorders of bone density and structure, unspecified site: Secondary | ICD-10-CM | POA: Diagnosis not present

## 2015-11-24 DIAGNOSIS — E039 Hypothyroidism, unspecified: Secondary | ICD-10-CM | POA: Diagnosis not present

## 2015-11-24 DIAGNOSIS — I951 Orthostatic hypotension: Secondary | ICD-10-CM | POA: Diagnosis not present

## 2015-11-24 DIAGNOSIS — J90 Pleural effusion, not elsewhere classified: Secondary | ICD-10-CM | POA: Insufficient documentation

## 2015-11-24 DIAGNOSIS — IMO0001 Reserved for inherently not codable concepts without codable children: Secondary | ICD-10-CM

## 2015-11-24 DIAGNOSIS — W19XXXA Unspecified fall, initial encounter: Secondary | ICD-10-CM | POA: Diagnosis not present

## 2015-11-24 DIAGNOSIS — E785 Hyperlipidemia, unspecified: Secondary | ICD-10-CM | POA: Diagnosis not present

## 2015-11-24 DIAGNOSIS — Z882 Allergy status to sulfonamides status: Secondary | ICD-10-CM | POA: Insufficient documentation

## 2015-11-24 DIAGNOSIS — Z888 Allergy status to other drugs, medicaments and biological substances status: Secondary | ICD-10-CM | POA: Insufficient documentation

## 2015-11-24 DIAGNOSIS — Z8249 Family history of ischemic heart disease and other diseases of the circulatory system: Secondary | ICD-10-CM | POA: Diagnosis not present

## 2015-11-24 DIAGNOSIS — Z91048 Other nonmedicinal substance allergy status: Secondary | ICD-10-CM | POA: Diagnosis not present

## 2015-11-24 DIAGNOSIS — Z7951 Long term (current) use of inhaled steroids: Secondary | ICD-10-CM | POA: Insufficient documentation

## 2015-11-24 DIAGNOSIS — J449 Chronic obstructive pulmonary disease, unspecified: Secondary | ICD-10-CM | POA: Diagnosis not present

## 2015-11-24 DIAGNOSIS — K219 Gastro-esophageal reflux disease without esophagitis: Secondary | ICD-10-CM | POA: Diagnosis not present

## 2015-11-24 DIAGNOSIS — W010XXA Fall on same level from slipping, tripping and stumbling without subsequent striking against object, initial encounter: Secondary | ICD-10-CM | POA: Insufficient documentation

## 2015-11-24 DIAGNOSIS — Z9889 Other specified postprocedural states: Secondary | ICD-10-CM

## 2015-11-24 DIAGNOSIS — J309 Allergic rhinitis, unspecified: Secondary | ICD-10-CM | POA: Insufficient documentation

## 2015-11-24 DIAGNOSIS — R911 Solitary pulmonary nodule: Secondary | ICD-10-CM | POA: Insufficient documentation

## 2015-11-24 DIAGNOSIS — S22089A Unspecified fracture of T11-T12 vertebra, initial encounter for closed fracture: Secondary | ICD-10-CM | POA: Diagnosis not present

## 2015-11-24 DIAGNOSIS — R319 Hematuria, unspecified: Secondary | ICD-10-CM | POA: Diagnosis not present

## 2015-11-24 DIAGNOSIS — R55 Syncope and collapse: Secondary | ICD-10-CM | POA: Insufficient documentation

## 2015-11-24 DIAGNOSIS — Z87891 Personal history of nicotine dependence: Secondary | ICD-10-CM | POA: Diagnosis not present

## 2015-11-24 DIAGNOSIS — S22000A Wedge compression fracture of unspecified thoracic vertebra, initial encounter for closed fracture: Secondary | ICD-10-CM | POA: Diagnosis present

## 2015-11-24 DIAGNOSIS — N184 Chronic kidney disease, stage 4 (severe): Secondary | ICD-10-CM | POA: Diagnosis not present

## 2015-11-24 DIAGNOSIS — Z90710 Acquired absence of both cervix and uterus: Secondary | ICD-10-CM | POA: Insufficient documentation

## 2015-11-24 DIAGNOSIS — G253 Myoclonus: Secondary | ICD-10-CM | POA: Insufficient documentation

## 2015-11-24 DIAGNOSIS — F329 Major depressive disorder, single episode, unspecified: Secondary | ICD-10-CM | POA: Insufficient documentation

## 2015-11-24 DIAGNOSIS — Z7984 Long term (current) use of oral hypoglycemic drugs: Secondary | ICD-10-CM | POA: Insufficient documentation

## 2015-11-24 DIAGNOSIS — S0003XA Contusion of scalp, initial encounter: Secondary | ICD-10-CM | POA: Insufficient documentation

## 2015-11-24 DIAGNOSIS — Z79899 Other long term (current) drug therapy: Secondary | ICD-10-CM | POA: Diagnosis not present

## 2015-11-24 DIAGNOSIS — L989 Disorder of the skin and subcutaneous tissue, unspecified: Secondary | ICD-10-CM | POA: Diagnosis not present

## 2015-11-24 DIAGNOSIS — Z809 Family history of malignant neoplasm, unspecified: Secondary | ICD-10-CM | POA: Diagnosis not present

## 2015-11-24 DIAGNOSIS — M353 Polymyalgia rheumatica: Secondary | ICD-10-CM | POA: Insufficient documentation

## 2015-11-24 DIAGNOSIS — M4850XA Collapsed vertebra, not elsewhere classified, site unspecified, initial encounter for fracture: Secondary | ICD-10-CM | POA: Diagnosis present

## 2015-11-24 DIAGNOSIS — M545 Low back pain: Secondary | ICD-10-CM | POA: Insufficient documentation

## 2015-11-24 DIAGNOSIS — I482 Chronic atrial fibrillation: Secondary | ICD-10-CM | POA: Diagnosis not present

## 2015-11-24 DIAGNOSIS — I130 Hypertensive heart and chronic kidney disease with heart failure and stage 1 through stage 4 chronic kidney disease, or unspecified chronic kidney disease: Secondary | ICD-10-CM | POA: Diagnosis not present

## 2015-11-24 DIAGNOSIS — R42 Dizziness and giddiness: Secondary | ICD-10-CM | POA: Diagnosis not present

## 2015-11-24 DIAGNOSIS — Z8051 Family history of malignant neoplasm of kidney: Secondary | ICD-10-CM | POA: Insufficient documentation

## 2015-11-24 DIAGNOSIS — R05 Cough: Secondary | ICD-10-CM | POA: Diagnosis not present

## 2015-11-24 LAB — BASIC METABOLIC PANEL
Anion gap: 4 — ABNORMAL LOW (ref 5–15)
BUN: 26 mg/dL — ABNORMAL HIGH (ref 6–20)
CHLORIDE: 105 mmol/L (ref 101–111)
CO2: 26 mmol/L (ref 22–32)
CREATININE: 1.56 mg/dL — AB (ref 0.44–1.00)
Calcium: 9 mg/dL (ref 8.9–10.3)
GFR, EST AFRICAN AMERICAN: 33 mL/min — AB (ref >60)
GFR, EST NON AFRICAN AMERICAN: 29 mL/min — AB (ref >60)
Glucose, Bld: 92 mg/dL (ref 65–99)
POTASSIUM: 4.8 mmol/L (ref 3.5–5.1)
SODIUM: 135 mmol/L (ref 135–145)

## 2015-11-24 LAB — CBC WITH DIFFERENTIAL/PLATELET
BASOS PCT: 0 %
Basophils Absolute: 0 10*3/uL (ref 0–0.1)
EOS ABS: 0.2 10*3/uL (ref 0–0.7)
EOS PCT: 3 %
HCT: 32.3 % — ABNORMAL LOW (ref 35.0–47.0)
HEMOGLOBIN: 10.4 g/dL — AB (ref 12.0–16.0)
Lymphocytes Relative: 15 %
Lymphs Abs: 1.5 10*3/uL (ref 1.0–3.6)
MCH: 25.5 pg — ABNORMAL LOW (ref 26.0–34.0)
MCHC: 32 g/dL (ref 32.0–36.0)
MCV: 79.7 fL — ABNORMAL LOW (ref 80.0–100.0)
Monocytes Absolute: 0.7 10*3/uL (ref 0.2–0.9)
Monocytes Relative: 8 %
NEUTROS PCT: 74 %
Neutro Abs: 7.1 10*3/uL — ABNORMAL HIGH (ref 1.4–6.5)
PLATELETS: 198 10*3/uL (ref 150–440)
RBC: 4.05 MIL/uL (ref 3.80–5.20)
RDW: 15.6 % — ABNORMAL HIGH (ref 11.5–14.5)
WBC: 9.6 10*3/uL (ref 3.6–11.0)

## 2015-11-24 LAB — MRSA PCR SCREENING: MRSA BY PCR: NEGATIVE

## 2015-11-24 MED ORDER — TRIAMCINOLONE ACETONIDE 0.1 % EX CREA
TOPICAL_CREAM | Freq: Two times a day (BID) | CUTANEOUS | Status: DC
  Administered 2015-11-24 – 2015-11-26 (×4): via TOPICAL
  Filled 2015-11-24: qty 15

## 2015-11-24 MED ORDER — OXYCODONE HCL 5 MG PO TABS
5.0000 mg | ORAL_TABLET | ORAL | Status: DC | PRN
  Administered 2015-11-25: 5 mg via ORAL
  Filled 2015-11-24: qty 1

## 2015-11-24 MED ORDER — HYDROCERIN EX CREA
TOPICAL_CREAM | Freq: Two times a day (BID) | CUTANEOUS | Status: DC
  Administered 2015-11-24 – 2015-11-26 (×5): via TOPICAL
  Filled 2015-11-24: qty 113

## 2015-11-24 MED ORDER — ACETAMINOPHEN 325 MG PO TABS
650.0000 mg | ORAL_TABLET | Freq: Four times a day (QID) | ORAL | Status: DC | PRN
  Administered 2015-11-24: 650 mg via ORAL
  Filled 2015-11-24: qty 2

## 2015-11-24 MED ORDER — METOPROLOL TARTRATE 1 MG/ML IV SOLN: 5.0000 mg | INTRAVENOUS | Status: DC | PRN

## 2015-11-24 MED ORDER — CARVEDILOL 6.25 MG PO TABS
12.5000 mg | ORAL_TABLET | Freq: Two times a day (BID) | ORAL | Status: DC
  Administered 2015-11-24 – 2015-11-27 (×7): 12.5 mg via ORAL
  Filled 2015-11-24 (×8): qty 2

## 2015-11-24 MED ORDER — DOCUSATE SODIUM 100 MG PO CAPS
100.0000 mg | ORAL_CAPSULE | Freq: Two times a day (BID) | ORAL | Status: DC
  Administered 2015-11-24 – 2015-11-26 (×6): 100 mg via ORAL
  Filled 2015-11-24 (×6): qty 1

## 2015-11-24 MED ORDER — TIOTROPIUM BROMIDE MONOHYDRATE 18 MCG IN CAPS
18.0000 ug | ORAL_CAPSULE | Freq: Every day | RESPIRATORY_TRACT | Status: DC
  Administered 2015-11-24 – 2015-11-26 (×3): 18 ug via RESPIRATORY_TRACT
  Filled 2015-11-24: qty 5

## 2015-11-24 MED ORDER — ONDANSETRON HCL 4 MG/2ML IJ SOLN
4.0000 mg | Freq: Once | INTRAMUSCULAR | Status: AC
  Administered 2015-11-24: 4 mg via INTRAVENOUS
  Filled 2015-11-24: qty 2

## 2015-11-24 MED ORDER — MORPHINE SULFATE (PF) 2 MG/ML IV SOLN: 2.0000 mg | INTRAVENOUS | Status: DC | PRN

## 2015-11-24 MED ORDER — ACETAMINOPHEN 650 MG RE SUPP: 650.0000 mg | Freq: Four times a day (QID) | RECTAL | Status: DC | PRN

## 2015-11-24 MED ORDER — MOMETASONE FURO-FORMOTEROL FUM 200-5 MCG/ACT IN AERO
2.0000 | INHALATION_SPRAY | Freq: Two times a day (BID) | RESPIRATORY_TRACT | Status: DC
  Administered 2015-11-24 – 2015-11-26 (×4): 2 via RESPIRATORY_TRACT
  Filled 2015-11-24: qty 8.8

## 2015-11-24 MED ORDER — POLYETHYLENE GLYCOL 3350 17 G PO PACK: 17.0000 g | PACK | Freq: Every day | ORAL | Status: DC | PRN

## 2015-11-24 MED ORDER — MELATONIN 3 MG PO TABS: 3.0000 mg | ORAL_TABLET | Freq: Every day | ORAL | Status: DC

## 2015-11-24 MED ORDER — PANTOPRAZOLE SODIUM 40 MG PO TBEC
40.0000 mg | DELAYED_RELEASE_TABLET | Freq: Every day | ORAL | Status: DC
  Administered 2015-11-24 – 2015-11-26 (×3): 40 mg via ORAL
  Filled 2015-11-24 (×3): qty 1

## 2015-11-24 MED ORDER — CRANBERRY 450 MG PO TABS: 450.0000 mg | ORAL_TABLET | Freq: Every day | ORAL | Status: DC

## 2015-11-24 MED ORDER — ONDANSETRON HCL 4 MG/2ML IJ SOLN: 4.0000 mg | Freq: Four times a day (QID) | INTRAMUSCULAR | Status: DC | PRN

## 2015-11-24 MED ORDER — MORPHINE SULFATE (PF) 2 MG/ML IV SOLN
2.0000 mg | Freq: Once | INTRAVENOUS | Status: AC
  Administered 2015-11-24: 2 mg via INTRAVENOUS
  Filled 2015-11-24: qty 1

## 2015-11-24 MED ORDER — ONDANSETRON HCL 4 MG PO TABS: 4.0000 mg | ORAL_TABLET | Freq: Four times a day (QID) | ORAL | Status: DC | PRN

## 2015-11-24 MED ORDER — MECLIZINE HCL 25 MG PO TABS
25.0000 mg | ORAL_TABLET | Freq: Every day | ORAL | Status: DC
Start: 2015-11-24 — End: 2015-11-27
  Administered 2015-11-24 – 2015-11-26 (×3): 25 mg via ORAL
  Filled 2015-11-24 (×3): qty 1

## 2015-11-24 MED ORDER — LEVOTHYROXINE SODIUM 88 MCG PO TABS
88.0000 ug | ORAL_TABLET | Freq: Every day | ORAL | Status: DC
  Administered 2015-11-26 – 2015-11-27 (×2): 88 ug via ORAL
  Filled 2015-11-24 (×4): qty 1

## 2015-11-24 MED ORDER — SODIUM CHLORIDE 0.9 % IV BOLUS (SEPSIS)
500.0000 mL | Freq: Once | INTRAVENOUS | Status: AC
  Administered 2015-11-24: 500 mL via INTRAVENOUS

## 2015-11-24 MED ORDER — SPIRONOLACTONE 25 MG PO TABS
12.5000 mg | ORAL_TABLET | Freq: Every day | ORAL | Status: DC
  Administered 2015-11-24 – 2015-11-26 (×3): 12.5 mg via ORAL
  Filled 2015-11-24 (×3): qty 1

## 2015-11-24 MED ORDER — OXYCODONE HCL 5 MG PO TABS
5.0000 mg | ORAL_TABLET | Freq: Once | ORAL | Status: AC
  Administered 2015-11-24: 5 mg via ORAL
  Filled 2015-11-24: qty 1

## 2015-11-24 MED ORDER — VANICREAM SPF 30 EX CREA: 1.0000 "application " | TOPICAL_CREAM | Freq: Two times a day (BID) | CUTANEOUS | Status: DC

## 2015-11-24 NOTE — ED Notes (Signed)
Dr Edd Fabian notified pt unable to walk r/t pain.  Felt like was going to pass out when sat up to side of bed.

## 2015-11-24 NOTE — ED Notes (Signed)
Pt Oxygen lowered to 80% after given morphine, Pt placed on 3L oxygen and put on cardiac monitor

## 2015-11-24 NOTE — ED Provider Notes (Signed)
[facility] Emergency Department Provider Note  ____________________________________________  Time seen: Approximately 8:07 AM  I have reviewed the triage vital signs and the nursing notes.   HISTORY  Chief Complaint Fall    HPI Christine Stone is a 82 y.o. female with history of atrial fibrillation on Eliquis, CHF, hyperlipidemia, diabetes who presents for evaluation of mechanical fall with head injury and back pain which occurred just prior to arrival, sudden onset, pain has been constant since onset and is worse with movement. The patient reports that she awoke this morning her usual state of health and was standing up trying to make her bed when she tripped over her blankets and fell hitting the back of her head on the door. She fell onto the floor and reports that she is also having some lower back pain. She did not lose consciousness. No chest pain or difficulty breathing. No palpitations. No recent illness including no cough, vomiting, diarrhea, fevers or chills.   Past Medical History  Diagnosis Date  . Atrial fibrillation ([place])     On coumadin   . CHF (congestive heart failure) (HCC)     Diastolic -- echo 0000000 EF [id number] diastolic dysfunction mild AS   . Hyperlipidemia   . Hypothyroidism   . Diabetes mellitus     Type II   . GERD (gastroesophageal reflux disease)     Sleep study   . Chest pain     R/Od A. fib   . Syncope 05-22-08 - 05/23/08    [facility] syncope R/o'd ARI  . PMR (polymyalgia rheumatica) (HCC)   . Fracture of multiple pubic rami [facility])     [place] 2013  . Hypertension   . COPD (chronic obstructive pulmonary disease) ([place])   . Depression   . Idiopathic peripheral neuropathy [facility])     Patient Active Problem List   Diagnosis Date Noted  . Compression fracture of body of thoracic vertebra 11/24/2015  . Orthostatic hypotension 10/06/2014  . Dizziness 06/16/2014  . Chronic cough 06/16/2014  . Living will on file at physician's  office 03/12/2013  . SOB (shortness of breath) 03/04/2013  . Skin lesion 12/07/2012  . Back pain 09/24/2012  . Pubic ramus fracture ([place]) 08/06/2012  . Hematuria 07/03/2012  . Feeling weak 04/12/2012  . PMR (polymyalgia rheumatica) (HCC) 02/11/2012  . AAA (abdominal aortic aneurysm) ([place]) 01/12/2012  . HYPERTENSION, BENIGN 02/21/2010  . Aortic valve stenosis 02/24/2009  . EMPHYSEMA 08/15/2008  . VASOVAGAL SYNCOPE 05/31/2008  . Chronic kidney disease, stage IV (severe) ([place]) 02/19/2008  . NEOPLASM UNCERTAIN BEHAVIOR PLEU THYMUS&MEDIAST 09/21/2007  . PULMONARY NODULE 09/21/2007  . ALLERGIC RHINITIS 02/25/2007  . HYPOTHYROIDISM 02/07/2007  . Hyperlipidemia 02/07/2007  . Diabetes mellitus type 2, controlled, without complications ([place]) [id number]  . ATRIAL FIBRILLATION -CHRONIC 02/06/2007  . CONGESTIVE HEART FAILURE 02/06/2007  . POSTMENOPAUSAL BLEEDING 02/06/2007    Past Surgical History  Procedure Laterality Date  . Appendectomy  1945  . Partial hysterectomy      Ovaries intact   . Pleural scarification      Pleural Fluid B9    No current outpatient prescriptions on file.  Allergies Loratadine; Other; Sulfa antibiotics; Sulfasalazine; and Tape  Family History  Problem Relation Age of Onset  . Thrombosis Father 48    thrombosis of heart   . Coronary artery disease Father   . Cancer Sister   . Heart failure Sister   . Hypertension Sister   . Cancer Brother  kidney    Social History Social History  Substance Use Topics  . Smoking status: Former Smoker -- 1.00 packs/day for 20 years    Types: Cigarettes    Quit date: 08/27/1990  . Smokeless tobacco: Never Used     Comment: 1/2 ppd x 40 years quit 1988.   Marland Kitchen Alcohol Use: No    Review of Systems Constitutional: No fever/chills Eyes: No visual changes. ENT: No sore throat. Cardiovascular: Denies chest pain. Respiratory: Denies shortness of breath. Gastrointestinal: No abdominal pain.  No nausea, no  vomiting.  No diarrhea.  No constipation. Genitourinary: Negative for dysuria. Musculoskeletal: Positive for back pain. Skin: Negative for rash. Neurological: Negative for headaches, focal weakness or numbness.  10-point ROS otherwise negative.  ____________________________________________   PHYSICAL EXAM:  VITAL SIGNS: ED Triage Vitals  Enc Vitals Group     BP 11/24/15 0757 166/70 mmHg     Pulse Rate 11/24/15 0757 75     Resp 11/24/15 0757 18     Temp 11/24/15 0757 98 F (36.7 C)     Temp Source 11/24/15 0757 Oral     SpO2 11/24/15 0757 94 %     Weight 11/24/15 0757 128 lb (58.06 kg)     Height --      Head Cir --      Peak Flow --      Pain Score --      Pain Loc --      Pain Edu? --      Excl. in [place]? --     Constitutional: Alert and oriented x 4. Well appearing and in no acute distress. Eyes: Conjunctivae are normal. PERRL. EOMI. Head: + parietal scalp hematoma. Nose: No congestion/rhinnorhea. Mouth/Throat: Mucous membranes are moist.  Oropharynx non-erythematous. Neck: No stridor.  No cervical spine tenderness to palpation. Cardiovascular: Normal rate, regular rhythm. Grossly normal heart sounds.  Good peripheral circulation. Respiratory: Normal respiratory effort.  No retractions. Lungs CTAB. Gastrointestinal: Soft and nontender. No distention. No CVA tenderness. Genitourinary: deferred Musculoskeletal: No lower extremity tenderness nor edema.  No joint effusions. Mild midline tender to palpation in the lumbar spine. Pelvis stable to rock and compression. Full active painless range of motion of bilateral hip joints. Neurologic:  Normal speech and language. No gross focal neurologic deficits are appreciated. 5 out of 5 strength bilateral upper and lower extremities, sensation intact to light touch throughout. Skin:  Skin is warm, dry and intact. No rash noted. Psychiatric: Mood and affect are normal. Speech and behavior are  normal.  ____________________________________________   LABS (all labs ordered are listed, but only abnormal results are displayed)  Labs Reviewed  CBC WITH DIFFERENTIAL/PLATELET - Abnormal; Notable for the following:    Hemoglobin 10.4 (*)    HCT 32.3 (*)    MCV 79.7 (*)    MCH 25.5 (*)    RDW 15.6 (*)    Neutro Abs 7.1 (*)    All other components within normal limits  BASIC METABOLIC PANEL - Abnormal; Notable for the following:    BUN 26 (*)    Creatinine, Ser 1.56 (*)    GFR calc non Af Amer 29 (*)    GFR calc Af Amer 33 (*)    Anion gap 4 (*)    All other components within normal limits  MRSA PCR SCREENING  CBC  CREATININE, SERUM   ____________________________________________  EKG  none ____________________________________________  RADIOLOGY  CT head  IMPRESSION: Right parietal scalp hematoma. Bony calvarium appears intact. There  is mild atrophy with patchy periventricular small vessel disease. There is a prior small lacunar type infarct in the caudate nucleus head on the right. No acute infarct evident. No intracranial mass, hemorrhage, or extra-axial fluid collection. Mild opacification of several left-sided mastoid air cells is noted without air-fluid level.  Xray lumbar spine IMPRESSION: 1. Mild compressions T11 and T12. These appear new. Diffuse osteopenia degenerative change.  2. A 4 cm abdominal aortic aneurysm cannot be excluded. Abdominal aortic ultrasound suggested for further evaluation .  Xray thoracic spine IMPRESSION: No acute bony abnormality. ____________________________________________   PROCEDURES  Procedure(s) performed: None  Critical Care performed: No  ____________________________________________   INITIAL IMPRESSION / ASSESSMENT AND PLAN / ED COURSE  Pertinent labs & imaging results that were available during my care of the patient were reviewed by me and considered in my medical decision making (see chart for  details).  Christine Stone is a 82 y.o. female with history of atrial fibrillation on Eliquis, CHF, hyperlipidemia, diabetes who presents for evaluation of mechanical fall with head injury and back pain which occurred just prior to arrival. On exam, she is very well-appearing and in no acute distress. Vital signs stable, she is afebrile. She does have a parietal scalp hematoma as well as some tenderness at the midline of the lumbar spine. She has an intact neurological examination. We'll obtain CT head, plain films of the lumbar spine and treat her pain. Reassess for disposition.  ----------------------------------------- 10:09 AM on 11/24/2015 -----------------------------------------  CT negative for any acute intra-renal pathology. X-ray of the thoracic spine negative. X-rays of the lumbar spine shows mild compression fractures at T11 and T12 which are likely new. 4 cm AAA cannot be excluded. I discussed this with the patient, she reports she has a known aneurysm that is under surveillance by her primary doctors. She has no abdominal pain, no abdominal tenderness and her vital signs are stable.  ----------------------------------------- 11:03 AM on 11/24/2015 ----------------------------------------- Patient unable to walk, barely able to sit forward due to pain inspite of IV morphine, by mouth oxycodone and Tylenol. Case discussed with hospitalist, Dr. Margaretmary Eddy for admission for intractable pain. Discussed the case also with Dr. Theodore Demark nurse, he is currently occupied in clinic but will see the patient.   ____________________________________________   FINAL CLINICAL IMPRESSION(S) / ED DIAGNOSES  Final diagnoses:  Vertebral compression fracture, initial encounter ([place])      Joanne Gavel, MD 11/24/15 [phone]

## 2015-11-24 NOTE — ED Notes (Addendum)
Pt from [facility].  Pt foot got tangled in blanket getting up this morning and she fell hitting head on door. No LOC. Denies pain other than head. Pt is on blood thinners

## 2015-11-24 NOTE — Progress Notes (Signed)
PHARMACIST - PHYSICIAN ORDER COMMUNICATION  CONCERNING: P&T Medication Policy on Herbal Medications  DESCRIPTION:  This patient's orders for:  Cranberry Caps and Melatonin  has been noted.  This product(s) is classified as an "herbal" or natural product. Due to a lack of definitive safety studies or FDA approval, nonstandard manufacturing practices, plus the potential risk of unknown drug-drug interactions while on inpatient medications, the Pharmacy and Therapeutics Committee does not permit the use of "herbal" or natural products of this type within [facility].   ACTION TAKEN: The pharmacy department is unable to verify this order at this time and your patient has been informed of this safety policy. Please reevaluate patient's clinical condition at discharge and address if the herbal or natural product(s) should be resumed at that time.

## 2015-11-24 NOTE — ED Notes (Signed)
Attempted to call report to floor 

## 2015-11-24 NOTE — Consult Note (Addendum)
Patient is a 82 -year-old who suffered a fall yesterday at home while making her bed. She denies having severe low back pain since then and is being admitted for this and other problems.  She reports intense pain with any motion but when she lays flat it's not too bad. She is hard of hearing it's difficult to assess her activity level at home  On exam she has intact sensation to the feet she is found to have about 5-6 beats of clonus on both feet. She is hyperreflexic at the patellar reflex.  X-rays reviewed showing what appears to be new T11-T12 compression fractures comparing to a prior CT in January.  Impression is T11 and T12 compression fractures, with her clonus I think we should get an MRI and make sure that she is not currently hematoma might be impinging on her spinal cord with her being on umbilicus is well. I did mention kyphoplasty and we'll discuss it further if there is no hematoma around the cord

## 2015-11-24 NOTE — H&P (Signed)
[facility] [place] NAME: Christine Stone    MR#:  [id number]  DATE OF BIRTH:  [birth year]  DATE OF ADMISSION:  11/24/2015  PRIMARY CARE PHYSICIAN: Pcp Not In System   REQUESTING/REFERRING PHYSICIAN: GAYLE,MD  CHIEF COMPLAINT:  Fall   HISTORY OF PRESENT ILLNESS:  Christine Stone  is a 82 y.o. female with a known history of Chronic atrial fibrillation, on Eliquis, congestive heart failure, hypothyroidism, diabetes medicine multiple other medical problems is sent over to the ED from [facility]  after she sustained a fall. Spinal x-rays have revealed T11 and 12 compression fracture. ED physician has discussed with Dr. Rudene Christians orthopedics who is going to see the patient. Patient has chronic atrial fibrillation and on Eliquis. CT head has revealed right scalp hematoma. Patient is very hard of hearing  PAST MEDICAL HISTORY:   Past Medical History  Diagnosis Date  . Atrial fibrillation ([place])     On coumadin   . CHF (congestive heart failure) (HCC)     Diastolic -- echo 0000000 EF [id number] diastolic dysfunction mild AS   . Hyperlipidemia   . Hypothyroidism   . Diabetes mellitus     Type II   . GERD (gastroesophageal reflux disease)     Sleep study   . Chest pain     R/Od A. fib   . Syncope 05-22-08 - 05/23/08    [facility] syncope R/o'd ARI  . PMR (polymyalgia rheumatica) (HCC)   . Fracture of multiple pubic rami [facility])     [place] 2013  . Hypertension   . COPD (chronic obstructive pulmonary disease) ([place])   . Depression   . Idiopathic peripheral neuropathy ([place])     PAST SURGICAL HISTOIRY:   Past Surgical History  Procedure Laterality Date  . Appendectomy  1945  . Partial hysterectomy      Ovaries intact   . Pleural scarification      Pleural Fluid B9    SOCIAL HISTORY:   Social History  Substance Use Topics  . Smoking status: Former Smoker -- 1.00 packs/day for 20 years    Types: Cigarettes    Quit date: 08/27/1990  .  Smokeless tobacco: Never Used     Comment: 1/2 ppd x 40 years quit 1988.   Marland Kitchen Alcohol Use: No    FAMILY HISTORY:   Family History  Problem Relation Age of Onset  . Thrombosis Father 48    thrombosis of heart   . Coronary artery disease Father   . Cancer Sister   . Heart failure Sister   . Hypertension Sister   . Cancer Brother     kidney    DRUG ALLERGIES:   Allergies  Allergen Reactions  . Loratadine     REACTION: makes head feel funny  . Other     Allergic to bandaid adhesive- local recation  . Sulfa Antibiotics Other (See Comments)    Unknown reaction  . Sulfasalazine   . Tape     Local reaction to adhesive.      REVIEW OF SYSTEMS:  CONSTITUTIONAL: No fever, fatigue or weakness.  EYES: No blurred or double vision.  EARS, NOSE, AND THROAT: No tinnitus or ear pain. Very hard of hearing RESPIRATORY: No cough, shortness of breath, wheezing or hemoptysis.  CARDIOVASCULAR: No chest pain, orthopnea, edema.  GASTROINTESTINAL: No nausea, vomiting, diarrhea or abdominal pain.  GENITOURINARY: No dysuria, hematuria.  ENDOCRINE: No polyuria, nocturia,  HEMATOLOGY: No anemia, easy bruising or  bleeding SKIN: No rash or lesion. MUSCULOSKELETAL: Reporting back pain. No joint pain or arthritis.   NEUROLOGIC: No tingling, numbness, weakness.  PSYCHIATRY: No anxiety or depression.   MEDICATIONS AT HOME:   Prior to Admission medications   Medication Sig Start Date End Date Taking? Authorizing Provider  budesonide-formoterol (SYMBICORT) 160-4.5 MCG/ACT inhaler Inhale 2 puffs into the lungs 2 (two) times daily.   Yes Historical Provider, MD  carvedilol (COREG) 12.5 MG tablet Take 12.5 mg by mouth 2 (two) times daily with a meal.  05/19/14  Yes Historical Provider, MD  Cranberry 450 MG TABS Take 450 mg by mouth daily.   Yes Historical Provider, MD  ELIQUIS 5 MG TABS tablet Take 5 mg by mouth 2 (two) times daily.  06/02/14  Yes Historical Provider, MD  flurandrenolide (CORDRAN) 0.05 %  lotion Apply 1 application topically 2 (two) times daily. Apply to affected areas until clear.   Yes Historical Provider, MD  levothyroxine (SYNTHROID, LEVOTHROID) 88 MCG tablet Take 88 mcg by mouth daily before breakfast.   Yes Historical Provider, MD  meclizine (ANTIVERT) 25 MG tablet Take 25 mg by mouth daily. Pt. Also takes one tablet daily as need for dizziness   Yes Historical Provider, MD  Melatonin 3 MG TABS Take 3 mg by mouth at bedtime.   Yes Historical Provider, MD  metFORMIN (GLUCOPHAGE) 500 MG tablet Take 500 mg by mouth 2 (two) times daily with a meal.  06/06/14  Yes Historical Provider, MD  omeprazole (PRILOSEC) 20 MG capsule Take 20 mg by mouth daily.   Yes Historical Provider, MD  SPIRIVA HANDIHALER 18 MCG inhalation capsule Place 18 mcg into inhaler and inhale daily.  06/03/14  Yes Historical Provider, MD  spironolactone (ALDACTONE) 25 MG tablet Take 12.5 mg by mouth daily.   Yes Historical Provider, MD  Sunscreens (VANICREAM SPF 30) CREA Apply 1 application topically 2 (two) times daily. Apply to entire body.   Yes Historical Provider, MD      VITAL SIGNS:  Blood pressure 125/43, pulse 83, temperature 98 F (36.7 C), temperature source Oral, resp. rate 15, weight 58.06 kg (128 lb), SpO2 100 %.  PHYSICAL EXAMINATION:  GENERAL:  82 y.o.-year-old patient lying in the bed with no acute distress.  EYES: Pupils equal, round, reactive to light and accommodation. No scleral icterus. Extraocular muscles intact.  HEENT: Head atraumatic, normocephalic. Oropharynx and nasopharynx clear. Right scalp hematoma is present NECK:  Supple, no jugular venous distention. No thyroid enlargement, no tenderness.  LUNGS: Normal breath sounds bilaterally, no wheezing, rales,rhonchi or crepitation. No use of accessory muscles of respiration.  CARDIOVASCULAR: Irregularly irregular, positive murmurs, rubs, or gallops.  ABDOMEN: Soft, nontender, nondistended. Bowel sounds present. No organomegaly or mass.   EXTREMITIES: Right forehand with laceration. No pedal edema, cyanosis, or clubbing.  NEUROLOGIC: Cranial nerves II through XII are intact. Muscle strength seemed to be at her baseline. Sensation intact. Gait not checked.  PSYCHIATRIC: The patient is alert and oriented x 3.  SKIN: No obvious rash, lesion, or ulcer.   LABORATORY PANEL:   CBC  Recent Labs Lab 11/24/15 1048  WBC 9.6  HGB 10.4*  HCT 32.3*  PLT 198   ------------------------------------------------------------------------------------------------------------------  Chemistries   Recent Labs Lab 11/24/15 1048  NA 135  K 4.8  CL 105  CO2 26  GLUCOSE 92  BUN 26*  CREATININE 1.56*  CALCIUM 9.0   ------------------------------------------------------------------------------------------------------------------  Cardiac Enzymes No results for input(s): TROPONINI in the last 168 hours. ------------------------------------------------------------------------------------------------------------------  RADIOLOGY:  Dg Thoracic Spine 2 View  11/24/2015  CLINICAL DATA:  Fall, upper and lower back pain. EXAM: THORACIC SPINE 2 VIEWS COMPARISON:  CT 09/09/2015 FINDINGS: Diffuse osteopenia. No fracture or malalignment. Mild degenerative spurring. IMPRESSION: No acute bony abnormality. Electronically Signed   By: Rolm Baptise M.D.   On: 11/24/2015 09:38   Dg Lumbar Spine 2-3 Views  11/24/2015  CLINICAL DATA:  Fall.  Initial evaluation. EXAM: LUMBAR SPINE - 2-3 VIEW COMPARISON:  CT 09/09/2015 and 08/02/2012 . FINDINGS: Diffuse multilevel degenerative change. Mild compressions T11 and T12. These appear new. Diffuse osteopenia degenerative change. Aortoiliac atherosclerotic vascular disease calcification. A 4 cm abdominal aortic aneurysm cannot be excluded. Abdominal aortic ultrasound suggested for further evaluation. IMPRESSION: 1. Mild compressions T11 and T12. These appear new. Diffuse osteopenia degenerative change. 2. A 4 cm  abdominal aortic aneurysm cannot be excluded. Abdominal aortic ultrasound suggested for further evaluation . Electronically Signed   By: Marcello Moores  Register   On: 11/24/2015 08:41   Ct Head Wo Contrast  11/24/2015  CLINICAL DATA:  Pain following fall EXAM: CT HEAD WITHOUT CONTRAST TECHNIQUE: Contiguous axial images were obtained from the base of the skull through the vertex without intravenous contrast. COMPARISON:  March 27, 2007 FINDINGS: There is mild diffuse atrophy. There is no intracranial mass hemorrhage, extra-axial fluid collection, or midline shift. There is patchy small vessel disease in the centra semiovale bilaterally. There is a small lacunar infarct in the head of the caudate nucleus on the right. No acute infarct is evident. The bony calvarium appears intact. Mastoids on the right are clear. There is opacification of several mastoids on the left. There is a right parietal scalp hematoma. No intraorbital lesions are evident. IMPRESSION: Right parietal scalp hematoma. Bony calvarium appears intact. There is mild atrophy with patchy periventricular small vessel disease. There is a prior small lacunar type infarct in the caudate nucleus head on the right. No acute infarct evident. No intracranial mass, hemorrhage, or extra-axial fluid collection. Mild opacification of several left-sided mastoid air cells is noted without air-fluid level. Electronically Signed   By: Lowella Grip III M.D.   On: 11/24/2015 08:43    EKG:   Orders placed or performed during the hospital encounter of 09/09/15  . EKG 12-Lead  . EKG 12-Lead    IMPRESSION AND PLAN:    Christine Stone  is a 82 y.o. female with a known history of Chronic atrial fibrillation, on Eliquis, congestive heart failure, hypothyroidism, diabetes medicine multiple other medical problems is sent over to the ED from [facility]  after she sustained a fall. Spinal x-rays have revealed T11 and 12 compression fracture. ED physician has discussed  with Dr. Rudene Christians orthopedics who is going to see the patient.  #1. Upper back pain-acute secondary to T11 and 12 compression fractures Pain management as needed Consult is placed to orthopedics Dr. Rudene Christians, will keep her nothing by mouth until seen by Ortho. Holding Eliquis until seen by orthopedics for possible kyphoplasty  #2 chronic atrial fibrillation-currently rate controlled On Eliquis will hold it for possible kyphoplasty  #3 abdominal aortic aneurysm 4 cm per x-rays results Will get abdominal ultrasound for more specification Currently patient is denying any abdominal pain.  #4 chronic kidney disease Avoid nephrotoxins and continue close monitoring of the patient renal function  #5 scalp hematoma the right side-provide supplemental treatment and ice packs  #6 essential hypertension with elevated blood pressure could be from pain Provide pain management IV Lopressor as needed  All the records are reviewed and case discussed with ED provider. Management plans discussed with the patient, family and they are in agreement.  CODE STATUS: DO NOT RESUSCITATE  TOTAL TIME TAKING CARE OF THIS PATIENT: 45 minutes.    Nicholes Mango M.D on 11/24/2015 at 12:22 PM  Between 7am to 6pm - Pager - [phone]  After 6pm go to [email or web address] - password [email or web address] [facility]  Office  [phone]  CC: Primary care physician; Pcp Not In System

## 2015-11-24 NOTE — ED Notes (Signed)
Pt to be kept NPO unil [place] completed tomorrow

## 2015-11-24 NOTE — ED Notes (Signed)
Pt's son and POA, Lawrie Penfold, requesting to be called when pt receives bed. [phone]

## 2015-11-24 NOTE — Progress Notes (Signed)
rn spoke with dr Rudene Christians who stated pt is on eliquis. Hold med until surgery on Monday. Pt may eat

## 2015-11-25 ENCOUNTER — Observation Stay: Payer: Medicare Other

## 2015-11-25 DIAGNOSIS — S22089A Unspecified fracture of T11-T12 vertebra, initial encounter for closed fracture: Secondary | ICD-10-CM | POA: Diagnosis not present

## 2015-11-25 LAB — CBC
HEMATOCRIT: 30.6 % — AB (ref 35.0–47.0)
Hemoglobin: 9.8 g/dL — ABNORMAL LOW (ref 12.0–16.0)
MCH: 25.8 pg — ABNORMAL LOW (ref 26.0–34.0)
MCHC: 32 g/dL (ref 32.0–36.0)
MCV: 80.6 fL (ref 80.0–100.0)
PLATELETS: 176 10*3/uL (ref 150–440)
RBC: 3.79 MIL/uL — ABNORMAL LOW (ref 3.80–5.20)
RDW: 15.7 % — AB (ref 11.5–14.5)
WBC: 6 10*3/uL (ref 3.6–11.0)

## 2015-11-25 LAB — BASIC METABOLIC PANEL
Anion gap: 3 — ABNORMAL LOW (ref 5–15)
BUN: 25 mg/dL — AB (ref 6–20)
CO2: 28 mmol/L (ref 22–32)
CREATININE: 1.68 mg/dL — AB (ref 0.44–1.00)
Calcium: 8.8 mg/dL — ABNORMAL LOW (ref 8.9–10.3)
Chloride: 106 mmol/L (ref 101–111)
GFR calc Af Amer: 30 mL/min — ABNORMAL LOW (ref >60)
GFR, EST NON AFRICAN AMERICAN: 26 mL/min — AB (ref >60)
Glucose, Bld: 89 mg/dL (ref 65–99)
POTASSIUM: 5.1 mmol/L (ref 3.5–5.1)
SODIUM: 137 mmol/L (ref 135–145)

## 2015-11-25 LAB — GLUCOSE, CAPILLARY
GLUCOSE-CAPILLARY: 114 mg/dL — AB (ref 65–99)
Glucose-Capillary: 102 mg/dL — ABNORMAL HIGH (ref 65–99)

## 2015-11-25 LAB — PROTIME-INR
INR: 1.17
Prothrombin Time: 15.1 seconds — ABNORMAL HIGH (ref 11.4–15.0)

## 2015-11-25 MED ORDER — INSULIN ASPART 100 UNIT/ML ~~LOC~~ SOLN
0.0000 [IU] | Freq: Three times a day (TID) | SUBCUTANEOUS | Status: DC
  Filled 2015-11-25: qty 2

## 2015-11-25 NOTE — Progress Notes (Signed)
[facility] at [place] NAME: Persephone Poggioli    MR#:  [id number]  DATE OF BIRTH:  26-Jan-1926  SUBJECTIVE:  CHIEF COMPLAINT:   Chief Complaint  Patient presents with  . Fall    REVIEW OF SYSTEMS:    ROS  DRUG ALLERGIES:   Allergies  Allergen Reactions  . Loratadine     REACTION: makes head feel funny  . Other     Allergic to bandaid adhesive- local recation  . Sulfa Antibiotics Other (See Comments)    Unknown reaction  . Sulfasalazine   . Tape     Local reaction to adhesive.      VITALS:  Blood pressure 84/42, pulse 80, temperature 97.9 F (36.6 C), temperature source Oral, resp. rate 18, height 5\' 3"  (1.6 m), weight 54.069 kg (119 lb 3.2 oz), SpO2 98 %.  PHYSICAL EXAMINATION:   Physical Exam  GENERAL:  82 y.o.-year-old patient lying in the bed with no acute distress.  EYES: Pupils equal, round, reactive to light and accommodation. No scleral icterus. Extraocular muscles intact.  HEENT: Head atraumatic, normocephalic. Oropharynx and nasopharynx clear.  NECK:  Supple, no jugular venous distention. No thyroid enlargement, no tenderness.  LUNGS: Normal breath sounds bilaterally, no wheezing, rales, rhonchi. No use of accessory muscles of respiration.  CARDIOVASCULAR: S1, S2 normal. No murmurs, rubs, or gallops.  ABDOMEN: Soft, nontender, nondistended. Bowel sounds present. No organomegaly or mass.  EXTREMITIES: No cyanosis, clubbing or edema b/l.    NEUROLOGIC: Cranial nerves II through XII are intact. No focal Motor or sensory deficits b/l.   PSYCHIATRIC: The patient is alert and oriented x 3.  SKIN: No obvious rash, lesion, or ulcer.   LABORATORY PANEL:   CBC  Recent Labs Lab 11/25/15 0412  WBC 6.0  HGB 9.8*  HCT 30.6*  PLT 176   ------------------------------------------------------------------------------------------------------------------ Chemistries   Recent Labs Lab 11/25/15 0412  NA 137  K 5.1   CL 106  CO2 28  GLUCOSE 89  BUN 25*  CREATININE 1.68*  CALCIUM 8.8*   ------------------------------------------------------------------------------------------------------------------  Cardiac Enzymes No results for input(s): TROPONINI in the last 168 hours. ------------------------------------------------------------------------------------------------------------------  RADIOLOGY:  Dg Thoracic Spine 2 View  11/24/2015  CLINICAL DATA:  Fall, upper and lower back pain. EXAM: THORACIC SPINE 2 VIEWS COMPARISON:  CT 09/09/2015 FINDINGS: Diffuse osteopenia. No fracture or malalignment. Mild degenerative spurring. IMPRESSION: No acute bony abnormality. Electronically Signed   By: Rolm Baptise M.D.   On: 11/24/2015 09:38   Dg Lumbar Spine 2-3 Views  11/24/2015  CLINICAL DATA:  Fall.  Initial evaluation. EXAM: LUMBAR SPINE - 2-3 VIEW COMPARISON:  CT 09/09/2015 and 08/02/2012 . FINDINGS: Diffuse multilevel degenerative change. Mild compressions T11 and T12. These appear new. Diffuse osteopenia degenerative change. Aortoiliac atherosclerotic vascular disease calcification. A 4 cm abdominal aortic aneurysm cannot be excluded. Abdominal aortic ultrasound suggested for further evaluation. IMPRESSION: 1. Mild compressions T11 and T12. These appear new. Diffuse osteopenia degenerative change. 2. A 4 cm abdominal aortic aneurysm cannot be excluded. Abdominal aortic ultrasound suggested for further evaluation . Electronically Signed   By: Marcello Moores  Register   On: 11/24/2015 08:41   Ct Head Wo Contrast  11/24/2015  CLINICAL DATA:  Pain following fall EXAM: CT HEAD WITHOUT CONTRAST TECHNIQUE: Contiguous axial images were obtained from the base of the skull through the vertex without intravenous contrast. COMPARISON:  March 27, 2007 FINDINGS: There is mild diffuse atrophy. There is no intracranial mass hemorrhage, extra-axial  fluid collection, or midline shift. There is patchy small vessel disease in the centra  semiovale bilaterally. There is a small lacunar infarct in the head of the caudate nucleus on the right. No acute infarct is evident. The bony calvarium appears intact. Mastoids on the right are clear. There is opacification of several mastoids on the left. There is a right parietal scalp hematoma. No intraorbital lesions are evident. IMPRESSION: Right parietal scalp hematoma. Bony calvarium appears intact. There is mild atrophy with patchy periventricular small vessel disease. There is a prior small lacunar type infarct in the caudate nucleus head on the right. No acute infarct evident. No intracranial mass, hemorrhage, or extra-axial fluid collection. Mild opacification of several left-sided mastoid air cells is noted without air-fluid level. Electronically Signed   By: Lowella Grip III M.D.   On: 11/24/2015 08:43   [place] Retroperitoneal Comp  11/25/2015  CLINICAL DATA:  Abdominal aortic aneurysm. EXAM: ULTRASOUND RETROPERITONEAL COMPLETE TECHNIQUE: Ultrasound examination of the abdominal aorta was performed to evaluate for abdominal aortic aneurysm. The common iliac arteries, IVC, and kidneys were also evaluated. COMPARISON:  None. FINDINGS: Abdominal Aorta Atherosclerotic plaque noted. Distal fusiform abdominal aortic aneurysm is seen, with length measuring approximately 3.7 cm. Maximum AP Diameter:  3.8 cm Maximum TRV Diameter: 3.2 cm Right Common Iliac Artery No aneurysm identified. Left Common Iliac Artery No aneurysm identified. IVC No abnormality visualized. Right Kidney Length: 9.5 cm. Renal parenchymal thinning and increased echogenicity seen, consistent medical renal disease. Tiny cyst measuring 9 mm is seen in the midpole. No evidence of a renal mass or hydronephrosis. Left Kidney Length: 9.7 cm. Renal parenchymal thinning and increased echogenicity seen. No evidence of renal mass or hydronephrosis. IMPRESSION: Distal abdominal aortic aneurysm measuring 3.8 cm. Recommend followup by ultrasound in 2  years. This recommendation follows ACR consensus guidelines: White Paper of the ACR Incidental Findings Committee II on Vascular Findings. J Am Coll Radiol 2013; 10:789-794. Bilateral renal parenchymal thinning and increased echogenicity, consistent with chronic medical renal disease. No evidence of hydronephrosis. Electronically Signed   By: Earle Gell M.D.   On: 11/25/2015 09:42     ASSESSMENT AND PLAN:    Ms Autry is a 82 y.o. female with a known history of Chronic atrial fibrillation, on Eliquis, congestive heart failure, hypothyroidism, diabetes medicine multiple other medical problems is sent over to the ED from [facility] after she sustained a fall. Spinal x-rays have revealed T11 and 12 compression fracture.   #. Upper back pain-acute secondary to T11 and 12 compression fractures Pain management as needed Discussed with Dr. Rudene Christians of orthopedics. MRI ordered and pending. She has a hematoma impinging on the spinal cord she will need transfer to neurosurgical service at a tertiary hospital. If not she will need kyphoplasty.  # chronic atrial fibrillation-currently rate controlled On Eliquis will hold it for possible kyphoplasty  # abdominal aortic aneurysm 4 cm per x-rays results Ultrasound showed a stable abdominal aortic aneurysm. Follow up as outpatient.  # chronic kidney disease Stable  # essential hypertension with elevated blood pressure could be from pain Continue Coreg and Aldactone The pressure dropped due to oxycodone. Will need to discontinue medications if patient stays hypotensive.  # Will need to consult physical therapy once we have MRI results and a final plan.  All the records are reviewed and case discussed with Care Management/Social Workerr. Management plans discussed with the patient, family and they are in agreement.  CODE STATUS: DNR  DVT Prophylaxis: SCDs  TOTAL TIME TAKING CARE OF THIS PATIENT: 30 minutes.   POSSIBLE D/C IN 2-3 DAYS, DEPENDING  ON CLINICAL CONDITION.  Hillary Bow R M.D on 11/25/2015 at 12:42 PM  Between 7am to 6pm - Pager - [phone]  After 6pm go to [email or web address] - password [email or web address] [facility]  Office  [phone]  CC: Primary care physician; Pcp Not In System  Note: This dictation was prepared with Dragon dictation along with smaller phrase technology. Any transcriptional errors that result from this process are unintentional.

## 2015-11-25 NOTE — Progress Notes (Signed)
MD ordered 0.9% NS 515mL bolus for hypotension caused by oxycodone. Fluid set-up became problematic just before transfer to MRI, disconnected fluids until pt returns.

## 2015-11-25 NOTE — Progress Notes (Signed)
NT called with low BP for pt. 84/42. Pt received oxy 5mg  this AM with AM meds, is also taking Coreg 12.5mg . Paged MD to notify of low BP. Will await orders.

## 2015-11-25 NOTE — Care Management Note (Signed)
Case Management Note  Patient Details  Name: Christine Stone MRN: [id number] Date of Birth: [birth year]  Subjective/Objective:       82yo Ms Christine Stone was admitted to an Observation bed 11/24/15 after a fall at her residence at [place]. Dx: T11 and T12 compression fractures. Chronic A-Fib and is on Eliquis. Very HOH. Hx: CHF, COPD, HTN, DM.  Dr Rudene Christians following for possible kyphoplasty. MRI pending at this time. Anticipate discharge back to [facility] if receives a kyphoplasty.             Action/Plan:   Expected Discharge Date:                  Expected Discharge Plan:     In-House Referral:     Discharge planning Services     Post Acute Care Choice:    Choice offered to:     DME Arranged:    DME Agency:     HH Arranged:    [place] Agency:     Status of Service:     Medicare Important Message Given:    Date Medicare IM Given:    Medicare IM give by:    Date Additional Medicare IM Given:    Additional Medicare Important Message give by:     If discussed at [place] of Stay Meetings, dates discussed:    Additional Comments:  Reginald Mangels A, RN 11/25/2015, 4:08 PM

## 2015-11-25 NOTE — Progress Notes (Signed)
BP rechecked when pt returned from MRI. BP 139/77. Did not administer fluid bolus.

## 2015-11-25 NOTE — Progress Notes (Signed)
Patient continues to have 45 beats of clonus on both feet, hope for stat MRI today thoracic spine to check for spine compression secondary to hematoma. If present will need transfer to neurosurgical care. If negative for that or any other significant spinal cord compression and no absence of hematoma can discuss Possible kyphoplasty with her. She is unable to get up without severe pain but when she is lying flat she does not have severe pain

## 2015-11-25 NOTE — Progress Notes (Signed)
Had discussion with patient and her son today reviewing the kyphoplasty procedure with both booklet and bone model.  Based on her fairly limited mobility secondary to severe pain my recommendation is to proceed with T12 kyphoplasty and hopefully can do that early Monday afternoon. After consideration patient has agreed to this. Risks benefits possible competitions discussed.

## 2015-11-26 DIAGNOSIS — S22089A Unspecified fracture of T11-T12 vertebra, initial encounter for closed fracture: Secondary | ICD-10-CM | POA: Diagnosis not present

## 2015-11-26 LAB — GLUCOSE, CAPILLARY
GLUCOSE-CAPILLARY: 98 mg/dL (ref 65–99)
GLUCOSE-CAPILLARY: 92 mg/dL (ref 65–99)
GLUCOSE-CAPILLARY: 108 mg/dL — AB (ref 65–99)
Glucose-Capillary: 110 mg/dL — ABNORMAL HIGH (ref 65–99)

## 2015-11-26 MED ORDER — CEFAZOLIN SODIUM 1-5 GM-% IV SOLN
1.0000 g | Freq: Once | INTRAVENOUS | Status: AC
  Administered 2015-11-27: 1 g via INTRAVENOUS
  Filled 2015-11-26: qty 50

## 2015-11-26 MED ORDER — CETYLPYRIDINIUM CHLORIDE 0.05 % MT LIQD
7.0000 mL | Freq: Two times a day (BID) | OROMUCOSAL | Status: DC
  Administered 2015-11-26 – 2015-11-27 (×3): 7 mL via OROMUCOSAL

## 2015-11-26 NOTE — Progress Notes (Signed)
Patient's consent is signed and placed in chart. Declined any pain medication for discomfort.

## 2015-11-26 NOTE — Care Management Obs Status (Signed)
MEDICARE OBSERVATION STATUS NOTIFICATION   Patient Details  Name: Christine Stone MRN: [id number] Date of Birth: [birth year]   Medicare Observation Status Notification Given:  Yes  Signed by son Amora Rubley for [name] who is currently unable to comprehend this form.     Irlene Crudup A, RN 11/26/2015, 4:26 PM

## 2015-11-26 NOTE — Progress Notes (Signed)
[place] [facility] [place] NAME: Jenin Bonton    MR#:  [id number]  DATE OF BIRTH:  1925-10-25  SUBJECTIVE:  CHIEF COMPLAINT:   Chief Complaint  Patient presents with  . Fall   Admitted for fall and back pain. Found to have T11 and T12  compression fractures.  She continues to complain of back pain.  REVIEW OF SYSTEMS:    Review of Systems  Constitutional: Negative for fever and chills.  HENT: Negative for sore throat.   Eyes: Negative for blurred vision, double vision and pain.  Respiratory: Negative for cough, hemoptysis, shortness of breath and wheezing.   Cardiovascular: Negative for chest pain, palpitations, orthopnea and leg swelling.  Gastrointestinal: Negative for heartburn, nausea, vomiting, abdominal pain, diarrhea and constipation.  Genitourinary: Negative for dysuria and hematuria.  Musculoskeletal: Positive for back pain. Negative for joint pain.  Skin: Negative for rash.  Neurological: Negative for sensory change, speech change, focal weakness and headaches.  Endo/Heme/Allergies: Does not bruise/bleed easily.  Psychiatric/Behavioral: Negative for depression. The patient is not nervous/anxious.     DRUG ALLERGIES:   Allergies  Allergen Reactions  . Loratadine     REACTION: makes head feel funny  . Other     Allergic to bandaid adhesive- local recation  . Sulfa Antibiotics Other (See Comments)    Unknown reaction  . Sulfasalazine   . Tape     Local reaction to adhesive.      VITALS:  Blood pressure 162/92, pulse 74, temperature 98.1 F (36.7 C), temperature source Oral, resp. rate 18, height 5\' 3"  (1.6 m), weight 54.069 kg (119 lb 3.2 oz), SpO2 100 %.  PHYSICAL EXAMINATION:   Physical Exam  GENERAL:  82 y.o.-year-old patient lying in the bed with no acute distress.  EYES: Pupils equal, round, reactive to light and accommodation. No scleral icterus. Extraocular muscles intact.  HEENT: Head atraumatic,  normocephalic. Oropharynx and nasopharynx clear.  NECK:  Supple, no jugular venous distention. No thyroid enlargement, no tenderness.  LUNGS: Normal breath sounds bilaterally, no wheezing, rales, rhonchi. No use of accessory muscles of respiration.  CARDIOVASCULAR: S1, S2 normal. No murmurs, rubs, or gallops.  ABDOMEN: Soft, nontender, nondistended. Bowel sounds present. No organomegaly or mass.  EXTREMITIES: No cyanosis, clubbing or edema b/l.    NEUROLOGIC: Cranial nerves II through XII are intact. No focal Motor or sensory deficits b/l.   PSYCHIATRIC: The patient is alert and awake SKIN: No obvious rash, lesion, or ulcer.  Tenderness mid back area. Clonus lower extremity's.   LABORATORY PANEL:   CBC  Recent Labs Lab 11/25/15 0412  WBC 6.0  HGB 9.8*  HCT 30.6*  PLT 176   ------------------------------------------------------------------------------------------------------------------ Chemistries   Recent Labs Lab 11/25/15 0412  NA 137  K 5.1  CL 106  CO2 28  GLUCOSE 89  BUN 25*  CREATININE 1.68*  CALCIUM 8.8*   ------------------------------------------------------------------------------------------------------------------  Cardiac Enzymes No results for input(s): TROPONINI in the last 168 hours. ------------------------------------------------------------------------------------------------------------------  RADIOLOGY:  Mr Thoracic Spine Wo Contrast  11/25/2015  CLINICAL DATA:  [place] several days ago and injured back. Persistent pain. EXAM: MRI THORACIC SPINE WITHOUT CONTRAST TECHNIQUE: Multiplanar, multisequence MR imaging of the thoracic spine was performed. No intravenous contrast was administered. COMPARISON:  Radiographs 11/24/2015 and chest CT 09/09/2015 FINDINGS: Normal alignment of the thoracic vertebral bodies. They demonstrate normal marrow signal except for a a T12 compression fracture. The fracture involves the upper aspect of the vertebral body. Minimal  compression deformity. No retropulsion. The other vertebral bodies are intact. Mild degenerative changes involving the thoracic spine with disc disease and facet disease. The thoracic spinal cord appears normal. No cord lesions or syrinx. No significant thoracic disc protrusions, spinal or foraminal stenosis. No significant paraspinal findings. The lungs are grossly clear. Small bilateral pleural effusions are noted. IMPRESSION: Acute superior endplate fracture of 624THL without significant compression deformity and no retropulsion. Age related degenerative changes involving the thoracic spine but no significant spinal or foraminal stenosis. Normal MR appearance of the thoracic spinal cord. Small bilateral pleural effusions and bibasilar atelectasis. Electronically Signed   By: Marijo Sanes M.D.   On: 11/25/2015 12:53   [place] Retroperitoneal Comp  11/25/2015  CLINICAL DATA:  Abdominal aortic aneurysm. EXAM: ULTRASOUND RETROPERITONEAL COMPLETE TECHNIQUE: Ultrasound examination of the abdominal aorta was performed to evaluate for abdominal aortic aneurysm. The common iliac arteries, IVC, and kidneys were also evaluated. COMPARISON:  None. FINDINGS: Abdominal Aorta Atherosclerotic plaque noted. Distal fusiform abdominal aortic aneurysm is seen, with length measuring approximately 3.7 cm. Maximum AP Diameter:  3.8 cm Maximum TRV Diameter: 3.2 cm Right Common Iliac Artery No aneurysm identified. Left Common Iliac Artery No aneurysm identified. IVC No abnormality visualized. Right Kidney Length: 9.5 cm. Renal parenchymal thinning and increased echogenicity seen, consistent medical renal disease. Tiny cyst measuring 9 mm is seen in the midpole. No evidence of a renal mass or hydronephrosis. Left Kidney Length: 9.7 cm. Renal parenchymal thinning and increased echogenicity seen. No evidence of renal mass or hydronephrosis. IMPRESSION: Distal abdominal aortic aneurysm measuring 3.8 cm. Recommend followup by ultrasound in 2  years. This recommendation follows ACR consensus guidelines: White Paper of the ACR Incidental Findings Committee II on Vascular Findings. J Am Coll Radiol 2013; 10:789-794. Bilateral renal parenchymal thinning and increased echogenicity, consistent with chronic medical renal disease. No evidence of hydronephrosis. Electronically Signed   By: Earle Gell M.D.   On: 11/25/2015 09:42     ASSESSMENT AND PLAN:    Ms Sliwa is a 82 y.o. female with a known history of Chronic atrial fibrillation, on Eliquis, congestive heart failure, hypothyroidism, diabetes medicine multiple other medical problems is sent over to the ED from [facility] after she sustained a fall. Spinal x-rays have revealed T11 and 12 compression fracture.   #. Upper back pain-acute secondary to T11 and 12 compression fractures Pain management as needed Discussed with Dr. Rudene Christians of orthopedics. MRI reviewed. MRI showed no hematoma or involvement of spinal cord. Kyphoplasty tomorrow  # chronic atrial fibrillation-currently rate controlled On Eliquis will hold it for kyphoplasty  # abdominal aortic aneurysm 4 cm per x-rays results Ultrasound showed a stable abdominal aortic aneurysm. Follow up as outpatient.  # chronic kidney disease Stable  # essential hypertension with elevated blood pressure could be from pain Continue Coreg and Aldactone The pressure dropped due to oxycodone. Will need to discontinue medications if patient stays hypotensive.  # Physical therapy to see after kyphoplasty  All the records are reviewed and case discussed with Care Management/Social Workerr. Management plans discussed with the patient, family and they are in agreement.  CODE STATUS: DNR  DVT Prophylaxis: SCDs  TOTAL TIME TAKING CARE OF THIS PATIENT: 30 minutes.   POSSIBLE D/C IN 2-3 DAYS, DEPENDING ON CLINICAL CONDITION.  Hillary Bow R M.D on 11/26/2015 at 11:43 AM  Between 7am to 6pm - Pager - [phone]  After 6pm go to  [email or web address] - password [facility]  Office  [phone]  CC: Primary care physician; Pcp Not In System  Note: This dictation was prepared with Dragon dictation along with smaller phrase technology. Any transcriptional errors that result from this process are unintentional.

## 2015-11-27 ENCOUNTER — Observation Stay: Payer: Medicare Other | Admitting: Certified Registered"

## 2015-11-27 ENCOUNTER — Observation Stay: Payer: Medicare Other

## 2015-11-27 ENCOUNTER — Encounter: Payer: Self-pay | Admitting: Anesthesiology

## 2015-11-27 ENCOUNTER — Encounter
Admission: EM | Disposition: A | Discharge status: Other Acute Inpatient Hospital | Payer: Self-pay | Source: Home / Self Care | Attending: Student

## 2015-11-27 DIAGNOSIS — S22089A Unspecified fracture of T11-T12 vertebra, initial encounter for closed fracture: Secondary | ICD-10-CM | POA: Diagnosis not present

## 2015-11-27 HISTORY — PX: KYPHOPLASTY: SHX5884

## 2015-11-27 LAB — BASIC METABOLIC PANEL
Anion gap: 8 (ref 5–15)
BUN: 25 mg/dL — ABNORMAL HIGH (ref 6–20)
CALCIUM: 9.2 mg/dL (ref 8.9–10.3)
CHLORIDE: 98 mmol/L — AB (ref 101–111)
CO2: 27 mmol/L (ref 22–32)
CREATININE: 1.56 mg/dL — AB (ref 0.44–1.00)
GFR calc non Af Amer: 29 mL/min — ABNORMAL LOW (ref >60)
GFR, EST AFRICAN AMERICAN: 33 mL/min — AB (ref >60)
Glucose, Bld: 127 mg/dL — ABNORMAL HIGH (ref 65–99)
POTASSIUM: 3.9 mmol/L (ref 3.5–5.1)
SODIUM: 133 mmol/L — AB (ref 135–145)

## 2015-11-27 LAB — GLUCOSE, CAPILLARY
GLUCOSE-CAPILLARY: 95 mg/dL (ref 65–99)
GLUCOSE-CAPILLARY: 85 mg/dL (ref 65–99)

## 2015-11-27 SURGERY — KYPHOPLASTY
Anesthesia: General

## 2015-11-27 MED ORDER — OXYCODONE HCL 5 MG PO TABS: 5.0000 mg | ORAL_TABLET | ORAL | Status: DC | PRN

## 2015-11-27 MED ORDER — BUPIVACAINE-EPINEPHRINE (PF) 0.5% -1:200000 IJ SOLN
INTRAMUSCULAR | Status: AC
  Filled 2015-11-27: qty 30

## 2015-11-27 MED ORDER — FENTANYL CITRATE (PF) 100 MCG/2ML IJ SOLN
INTRAMUSCULAR | Status: AC
  Filled 2015-11-27: qty 2

## 2015-11-27 MED ORDER — PHENYLEPHRINE HCL 10 MG/ML IJ SOLN
INTRAMUSCULAR | Status: DC | PRN
  Administered 2015-11-27 (×3): 100 ug via INTRAVENOUS

## 2015-11-27 MED ORDER — FENTANYL CITRATE (PF) 100 MCG/2ML IJ SOLN
INTRAMUSCULAR | Status: DC | PRN
  Administered 2015-11-27: 50 ug via INTRAVENOUS

## 2015-11-27 MED ORDER — LACTATED RINGERS IV SOLN
INTRAVENOUS | Status: DC | PRN
  Administered 2015-11-27: 12:00:00 via INTRAVENOUS

## 2015-11-27 MED ORDER — OXYCODONE HCL 5 MG PO TABS: 5.0000 mg | ORAL_TABLET | Freq: Once | ORAL | Status: DC | PRN

## 2015-11-27 MED ORDER — PROPOFOL 500 MG/50ML IV EMUL
INTRAVENOUS | Status: DC | PRN
  Administered 2015-11-27: 25 ug/kg/min via INTRAVENOUS

## 2015-11-27 MED ORDER — BUPIVACAINE-EPINEPHRINE (PF) 0.5% -1:200000 IJ SOLN
INTRAMUSCULAR | Status: DC | PRN
  Administered 2015-11-27: 10 mL via PERINEURAL

## 2015-11-27 MED ORDER — OXYCODONE HCL 5 MG/5ML PO SOLN: 5.0000 mg | Freq: Once | ORAL | Status: DC | PRN

## 2015-11-27 MED ORDER — FENTANYL CITRATE (PF) 100 MCG/2ML IJ SOLN
25.0000 ug | INTRAMUSCULAR | Status: DC | PRN
  Administered 2015-11-27: 12.5 ug via INTRAVENOUS

## 2015-11-27 MED ORDER — LIDOCAINE HCL 1 % IJ SOLN
INTRAMUSCULAR | Status: DC | PRN
  Administered 2015-11-27 (×2): 10 mL

## 2015-11-27 MED ORDER — APIXABAN 5 MG PO TABS: 5.0000 mg | ORAL_TABLET | Freq: Two times a day (BID) | ORAL | Status: DC

## 2015-11-27 MED ORDER — LIDOCAINE HCL (CARDIAC) 20 MG/ML IV SOLN
INTRAVENOUS | Status: DC | PRN
  Administered 2015-11-27: 50 mg via INTRAVENOUS

## 2015-11-27 MED ORDER — LIDOCAINE HCL (PF) 1 % IJ SOLN
INTRAMUSCULAR | Status: AC
  Filled 2015-11-27: qty 60

## 2015-11-27 MED ORDER — PROPOFOL 10 MG/ML IV BOLUS
INTRAVENOUS | Status: DC | PRN
  Administered 2015-11-27: 20 mg via INTRAVENOUS

## 2015-11-27 MED ORDER — KETAMINE HCL 10 MG/ML IJ SOLN
INTRAMUSCULAR | Status: DC | PRN
  Administered 2015-11-27: 25 mg via INTRAVENOUS

## 2015-11-27 SURGICAL SUPPLY — 14 items
BNDG ADH 2 X3.75 FABRIC TAN LF (GAUZE/BANDAGES/DRESSINGS) ×3 IMPLANT
CEMENT KYPHON CX01A KIT/MIXER (Cement) ×3 IMPLANT
DEVICE BIOPSY BONE KYPHX (INSTRUMENTS) ×3 IMPLANT
DRAPE C-ARM XRAY 36X54 (DRAPES) ×3 IMPLANT
DURAPREP 26ML APPLICATOR (WOUND CARE) ×3 IMPLANT
GLOVE SURG ORTHO 9.0 STRL STRW (GLOVE) ×3 IMPLANT
GOWN SPECIALTY ULTRA XL (MISCELLANEOUS) ×3 IMPLANT
GOWN STRL REUS W/ TWL LRG LVL3 (GOWN DISPOSABLE) ×1 IMPLANT
GOWN STRL REUS W/TWL LRG LVL3 (GOWN DISPOSABLE) ×2
LIQUID BAND (GAUZE/BANDAGES/DRESSINGS) ×3 IMPLANT
PACK KYPHOPLASTY (MISCELLANEOUS) ×3 IMPLANT
STRAP SAFETY BODY (MISCELLANEOUS) ×3 IMPLANT
TRAY KYPHOPAK 15/3 EXPRESS 1ST (MISCELLANEOUS) ×3 IMPLANT
TRAY KYPHOPAK 20/3 EXPRESS 1ST (MISCELLANEOUS) IMPLANT

## 2015-11-27 NOTE — Progress Notes (Signed)
Pt has multiple bruised areas to arms  Noted tegaderm to right upper arm  Due to bump while transferred pt to bed

## 2015-11-27 NOTE — Discharge Instructions (Signed)
°  DIET:  Cardiac diet  DISCHARGE CONDITION:  Stable  ACTIVITY:  Activity as tolerated  OXYGEN:  Home Oxygen: No.   Oxygen Delivery: room air  DISCHARGE LOCATION:  [place]   If you experience worsening of your admission symptoms, develop shortness of breath, life threatening emergency, suicidal or homicidal thoughts you must seek medical attention immediately by calling 911 or calling your MD immediately  if symptoms less severe.  You Must read complete instructions/literature along with all the possible adverse reactions/side effects for all the Medicines you take and that have been prescribed to you. Take any new Medicines after you have completely understood and accpet all the possible adverse reactions/side effects.   Please note  You were cared for by a hospitalist during your hospital stay. If you have any questions about your discharge medications or the care you received while you were in the hospital after you are discharged, you can call the unit and asked to speak with the hospitalist on call if the hospitalist that took care of you is not available. Once you are discharged, your primary care physician will handle any further medical issues. Please note that NO REFILLS for any discharge medications will be authorized once you are discharged, as it is imperative that you return to your primary care physician (or establish a relationship with a primary care physician if you do not have one) for your aftercare needs so that they can reassess your need for medications and monitor your lab values.

## 2015-11-27 NOTE — Evaluation (Signed)
Physical Therapy Evaluation Patient Details Name: Christine Stone MRN: [id number] DOB: [birth year] Today's Date: 11/27/2015   History of Present Illness  Pt is a 82 y.o. female with PMH of A-Fib, CHF, and diabetes.  Pt presented with a mechanical fall and a R scalp hematoma.  Pt was admitted for a spinal T11-12 fx (11-24-15).  Pt is s/p kyphoplasty (T11-12) on 11-27-15.    Clinical Impression  Prior to admission pt was independent with rollator.  Pt reported she has had one fall in the past 6 months.  Pt lives in an assisted living facility.  Pt was min assist with bed mobility.  Pt sat EOB for 5 minutes, pt required intermittent min assist to maintain upright trunk control.  Pt was min assist (2 sets) for sit to stand with RW, and pt was CGA for ambulation with RW for 15 feet.  Pt reported that she was tired at the end of the session.  Due to aforementioned function and strength deficits, pt is in need of skilled physical therapy.  It is recommended that pt is discharged to her assisted living facility with assist for mobility/OOB and home health PT when medically appropriate.     Follow Up Recommendations Home health PT;Other (comment) (assist for mobility/OOB)    Equipment Recommendations  None recommended by PT    Recommendations for Other Services       Precautions / Restrictions Precautions Precautions: Fall Precaution Comments: s/p kyphoplasty, limit spinal bending and twisting. Restrictions Weight Bearing Restrictions: No      Mobility  Bed Mobility Overal bed mobility: Needs Assistance Bed Mobility: Rolling;Sidelying to Sit Rolling: Min assist Sidelying to sit: Min assist       General bed mobility comments: increased time, trunk assistance, VC's and tactile cues to maintain a neutral trunk   Transfers Overall transfer level: Needs assistance Equipment used: Rolling walker (2 wheeled) Transfers: Sit to/from Stand Sit to Stand: Min assist (2 sets )         General  transfer comment: increased time   Ambulation/Gait Ambulation/Gait assistance: Min guard Ambulation Distance (Feet): 15 Feet Assistive device: Rolling walker (2 wheeled) Gait Pattern/deviations: Decreased step length - right;Decreased step length - left Gait velocity: decreased       Stairs            Wheelchair Mobility    Modified Rankin (Stroke Patients Only)       Balance Overall balance assessment: Needs assistance Sitting-balance support: Single extremity supported;Feet supported (bed rail) Sitting balance-Leahy Scale: Poor Sitting balance - Comments: pt required assistance with intermittent trunk control to maintain upright posture sitting EOB. Postural control: Posterior lean Standing balance support: Bilateral upper extremity supported (RW ) Standing balance-Leahy Scale: Fair                               Pertinent Vitals/Pain Pain Assessment: 0-10 Pain Score: 4  Pain Location: low back  Pain Descriptors / Indicators: Aching;Operative site guarding Pain Intervention(s): Limited activity within patient's tolerance;Monitored during session;Repositioned  See flow sheet for vitals.     Home Living Family/patient expects to be discharged to:: Assisted living               Home Equipment: Walker - 4 wheels      Prior Function Level of Independence: Independent with assistive device(s) (rollator )        Comments: pt has had one fall in the past  6 months         Hand Dominance        Extremity/Trunk Assessment   Upper Extremity Assessment: Overall WFL for tasks assessed     shoulder flexors, shoulder abductors, elbow flexors, elbow extensors: B at least a 3/5        Lower Extremity Assessment: Generalized weakness     Hip flexors, quadriceps, hamstrings, dorsiflexors, plantarflexors: B at least a 3/5     Cervical / Trunk Assessment: Normal  Communication   Communication: No difficulties  Cognition  Arousal/Alertness: Awake/alert Behavior During Therapy: WFL for tasks assessed/performed Overall Cognitive Status: Within Functional Limits for tasks assessed                      General Comments   Nursing was contacted and cleared pt for physical therapy.  Pt was agreeable and session was modified due to fatigue.  Pt's son was present during the majority of the session.  Pt was instructed on maintaining a neutral spine during mobility and to limit bending and twisting.       Exercises        Assessment/Plan    PT Assessment Patient needs continued PT services  PT Diagnosis Generalized weakness   PT Problem List Decreased strength;Decreased range of motion;Decreased activity tolerance;Decreased mobility;Pain  PT Treatment Interventions DME instruction;Gait training;Functional mobility training;Therapeutic activities;Therapeutic exercise;Patient/family education   PT Goals (Current goals can be found in the Care Plan section) Acute Rehab PT Goals Patient Stated Goal: to go back to her facility  PT Goal Formulation: With patient Time For Goal Achievement: 12/11/15 Potential to Achieve Goals: Good    Frequency BID   Barriers to discharge        Co-evaluation               End of Session Equipment Utilized During Treatment: Gait belt Activity Tolerance: Patient limited by fatigue Patient left: in chair;with call bell/phone within reach;with chair alarm set;with family/visitor present;with nursing/sitter in room Nurse Communication: Mobility status;Precautions         Time: [id number] PT Time Calculation (min) (ACUTE ONLY): 35 min   Charges:         PT G Codes:        Mittie Bodo, SPT Mittie Bodo 11/27/2015, 4:00 PM

## 2015-11-27 NOTE — Discharge Summary (Signed)
[place] at [place] NAME: Christine Stone    MR#:  [id number]  DATE OF BIRTH:  11-Dec-1925  DATE OF ADMISSION:  11/24/2015 ADMITTING PHYSICIAN: Nicholes Mango, MD  DATE OF DISCHARGE: 11/27/2015  PRIMARY CARE PHYSICIAN: Pcp Not In System   ADMISSION DIAGNOSIS:  Clonus [R25.8] AAA (abdominal aortic aneurysm) ([place]) [I71.4] Vertebral compression fracture, initial encounter ([place]) [M48.50XA]  DISCHARGE DIAGNOSIS:  Active Problems:   Compression fracture of body of thoracic vertebra   SECONDARY DIAGNOSIS:   Past Medical History  Diagnosis Date  . Atrial fibrillation ([place])     On coumadin   . CHF (congestive heart failure) ([facility])     Diastolic -- echo 0000000 EF [id number] diastolic dysfunction mild AS   . Hyperlipidemia   . Hypothyroidism   . Diabetes mellitus     Type II   . GERD (gastroesophageal reflux disease)     Sleep study   . Chest pain     R/Od A. fib   . Syncope 05-22-08 - 05/23/08    [facility] syncope R/o'd ARI  . PMR (polymyalgia rheumatica) ([facility])   . Fracture of multiple pubic rami [facility])     [place] 2013  . Hypertension   . COPD (chronic obstructive pulmonary disease) ([place])   . Depression   . Idiopathic peripheral neuropathy ([place])      ADMITTING HISTORY  Christine Stone is a 82 y.o. female with a known history of Chronic atrial fibrillation, on Eliquis, congestive heart failure, hypothyroidism, diabetes medicine multiple other medical problems is sent over to the ED from [facility] after she sustained a fall. Spinal x-rays have revealed T11 and 12 compression fracture. ED physician has discussed with Dr. Rudene Christians orthopedics who is going to see the patient. Patient has chronic atrial fibrillation and on Eliquis. CT head has revealed right scalp hematoma. Patient is very hard of hearing  HOSPITAL COURSE:   Christine Stone is a 82 y.o. female with a known history of Chronic atrial fibrillation, on Eliquis, congestive heart  failure, hypothyroidism, diabetes medicine multiple other medical problems is sent over to the ED from [facility] after she sustained a fall. Spinal x-rays have revealed T11 and 12 compression fracture.   #. Upper back pain-acute secondary to T11 and 12 compression fractures On IV and oral pain medications in the hospital per MRI showed no spinal cord effect or hematoma. Kyphoplasty was done today and patient pain is significantly improved and is being discharged back to her assisted living facility.  # chronic atrial fibrillation-currently rate controlled Eliquis held for kyphoplasty and will be restarted.  # abdominal aortic aneurysm 4 cm per x-rays results Ultrasound showed a stable abdominal aortic aneurysm. Follow up as outpatient.  # chronic kidney disease Stable  # essential hypertension with elevated blood pressure could be from pain Continue Coreg and Aldactone  Stable for discharge back to assisted living facility.  CONSULTS OBTAINED:  Treatment Team:  Hessie Knows, MD  DRUG ALLERGIES:   Allergies  Allergen Reactions  . Loratadine     REACTION: makes head feel funny  . Other     Allergic to bandaid adhesive- local recation  . Sulfa Antibiotics Other (See Comments)    Unknown reaction  . Sulfasalazine   . Tape     Local reaction to adhesive.      DISCHARGE MEDICATIONS:   Current Discharge Medication List    START taking these medications   Details  oxyCODONE (OXY IR/ROXICODONE)  5 MG immediate release tablet Take 1 tablet (5 mg total) by mouth every 4 (four) hours as needed for severe pain. Qty: 30 tablet, Refills: 0      CONTINUE these medications which have NOT CHANGED   Details  budesonide-formoterol (SYMBICORT) 160-4.5 MCG/ACT inhaler Inhale 2 puffs into the lungs 2 (two) times daily.    carvedilol (COREG) 12.5 MG tablet Take 12.5 mg by mouth 2 (two) times daily with a meal.     Cranberry 450 MG TABS Take 450 mg by mouth daily.    ELIQUIS 5 MG  TABS tablet Take 5 mg by mouth 2 (two) times daily.     flurandrenolide (CORDRAN) 0.05 % lotion Apply 1 application topically 2 (two) times daily. Apply to affected areas until clear.    levothyroxine (SYNTHROID, LEVOTHROID) 88 MCG tablet Take 88 mcg by mouth daily before breakfast.    meclizine (ANTIVERT) 25 MG tablet Take 25 mg by mouth daily. Pt. Also takes one tablet daily as need for dizziness    Melatonin 3 MG TABS Take 3 mg by mouth at bedtime.    metFORMIN (GLUCOPHAGE) 500 MG tablet Take 500 mg by mouth 2 (two) times daily with a meal.     omeprazole (PRILOSEC) 20 MG capsule Take 20 mg by mouth daily.    SPIRIVA HANDIHALER 18 MCG inhalation capsule Place 18 mcg into inhaler and inhale daily.     spironolactone (ALDACTONE) 25 MG tablet Take 12.5 mg by mouth daily.    Sunscreens (VANICREAM SPF 30) CREA Apply 1 application topically 2 (two) times daily. Apply to entire body.        Today   VITAL SIGNS:  Blood pressure 97/48, pulse 61, temperature 97.7 F (36.5 C), temperature source Oral, resp. rate 18, height 5\' 3"  (1.6 m), weight 54.069 kg (119 lb 3.2 oz), SpO2 90 %.  I/O:   Intake/Output Summary (Last 24 hours) at 11/27/15 1538 Last data filed at 11/27/15 1352  Gross per 24 hour  Intake    520 ml  Output      0 ml  Net    520 ml    PHYSICAL EXAMINATION:  Physical Exam  GENERAL:  82 y.o.-year-old patient lying in the bed with no acute distress.  LUNGS: Normal breath sounds bilaterally, no wheezing, rales,rhonchi or crepitation. No use of accessory muscles of respiration.  CARDIOVASCULAR: S1, S2 normal. No murmurs, rubs, or gallops.  ABDOMEN: Soft, non-tender, non-distended. Bowel sounds present. No organomegaly or mass.  NEUROLOGIC: Moves all 4 extremities. PSYCHIATRIC: The patient is alert and awake SKIN: No obvious rash, lesion, or ulcer.   DATA REVIEW:   CBC  Recent Labs Lab 11/25/15 0412  WBC 6.0  HGB 9.8*  HCT 30.6*  PLT 176    Chemistries    Recent Labs Lab 11/25/15 0412  NA 137  K 5.1  CL 106  CO2 28  GLUCOSE 89  BUN 25*  CREATININE 1.68*  CALCIUM 8.8*    Cardiac Enzymes No results for input(s): TROPONINI in the last 168 hours.  Microbiology Results  Results for orders placed or performed during the hospital encounter of 11/24/15  MRSA PCR Screening     Status: None   Collection Time: 11/24/15  2:05 PM  Result Value Ref Range Status   MRSA by PCR NEGATIVE NEGATIVE Final    Comment:        The GeneXpert MRSA Assay (FDA approved for NASAL specimens only), is one component of a comprehensive MRSA colonization  surveillance program. It is not intended to diagnose MRSA infection nor to guide or monitor treatment for MRSA infections.     RADIOLOGY:  Dg Thoracic Spine 2 View  11/27/2015  CLINICAL DATA:  Kyphoplasty EXAM: THORACIC SPINE 2 VIEWS COMPARISON:  11/25/2015 FINDINGS: Kyphoplasty presumably at the T12 level. Material appears confined nearly entirely to the vertebral body. IMPRESSION: Kyphoplasty Electronically Signed   By: Skipper Cliche M.D.   On: 11/27/2015 15:28   Dg C-arm 1-60 Min  11/27/2015  CLINICAL DATA:  Back pain. EXAM: DG C-ARM 61-120 MIN COMPARISON:  Multiple priors. FINDINGS: C-arm used for T12 kyphoplasty.  Please see operative report. IMPRESSION: As above. Electronically Signed   By: Staci Righter M.D.   On: 11/27/2015 15:32    Follow up with PCP in 1 week.  Management plans discussed with the patient, family and they are in agreement.  CODE STATUS:     Code Status Orders        Start     Ordered   11/24/15 1345  Do not attempt resuscitation (DNR)   Continuous    Question Answer Comment  In the event of cardiac or respiratory ARREST Do not call a "code blue"   In the event of cardiac or respiratory ARREST Do not perform Intubation, CPR, defibrillation or ACLS   In the event of cardiac or respiratory ARREST Use medication by any route, position, wound care, and other measures  to relive pain and suffering. May use oxygen, suction and manual treatment of airway obstruction as needed for comfort.      11/24/15 1344    Code Status History    Date Active Date Inactive Code Status Order ID Comments User Context   This patient has a current code status but no historical code status.      TOTAL TIME TAKING CARE OF THIS PATIENT ON DAY OF DISCHARGE: more than 30 minutes.   Hillary Bow R M.D on 11/27/2015 at 3:38 PM  Between 7am to 6pm - Pager - [phone]  After 6pm go to [email or web address] - password [email or web address] [place] [facility]  Office  [phone]  CC: Primary care physician; Pcp Not In System  Note: This dictation was prepared with Dragon dictation along with smaller phrase technology. Any transcriptional errors that result from this process are unintentional.

## 2015-11-27 NOTE — NC FL2 (Signed)
[place] LEVEL OF CARE SCREENING TOOL     IDENTIFICATION  Patient Name: Christine Stone Birthdate: 1926-05-09 Sex: female Admission Date (Current Location): 11/24/2015  [place] and [place] Number:  Engineering geologist and Address:  [facility], [address] [place], [place] [address]      Provider Number: [id number]  Attending Physician Name and Address:  Hillary Bow, MD  Relative Name and Phone Number:       Current Level of Care: Hospital Recommended Level of Care: [place] Prior Approval Number:    Date Approved/Denied:   PASRR Number:  ( [id number] )  Discharge Plan: Domiciliary (Rest home)    Current Diagnoses: Patient Active Problem List   Diagnosis Date Noted  . Compression fracture of body of thoracic vertebra 11/24/2015  . Orthostatic hypotension 10/06/2014  . Dizziness 06/16/2014  . Chronic cough 06/16/2014  . Living will on file at physician's office 03/12/2013  . SOB (shortness of breath) 03/04/2013  . Skin lesion 12/07/2012  . Back pain 09/24/2012  . Pubic ramus fracture ([place]) 08/06/2012  . Hematuria 07/03/2012  . Feeling weak 04/12/2012  . PMR (polymyalgia rheumatica) (HCC) 02/11/2012  . AAA (abdominal aortic aneurysm) ([place]) 01/12/2012  . HYPERTENSION, BENIGN 02/21/2010  . Aortic valve stenosis 02/24/2009  . EMPHYSEMA 08/15/2008  . VASOVAGAL SYNCOPE 05/31/2008  . Chronic kidney disease, stage IV (severe) ([place]) 02/19/2008  . NEOPLASM UNCERTAIN BEHAVIOR PLEU THYMUS&MEDIAST 09/21/2007  . PULMONARY NODULE 09/21/2007  . ALLERGIC RHINITIS 02/25/2007  . HYPOTHYROIDISM 02/07/2007  . Hyperlipidemia 02/07/2007  . Diabetes mellitus type 2, controlled, without complications ([place]) [id number]  . ATRIAL FIBRILLATION -CHRONIC 02/06/2007  . CONGESTIVE HEART FAILURE 02/06/2007  . POSTMENOPAUSAL BLEEDING 02/06/2007    Orientation RESPIRATION BLADDER Height & Weight     Self, Place, Situation   Normal Incontinent Weight: 119 lb 3.2 oz (54.069 kg) Height:  5\' 3"  (160 cm)  BEHAVIORAL SYMPTOMS/MOOD NEUROLOGICAL BOWEL NUTRITION STATUS   (none )  (none ) Continent Diet (Regular Diet )  AMBULATORY STATUS COMMUNICATION OF NEEDS Skin   Extensive Assist Verbally Surgical wounds (Back Incision. )                       Personal Care Assistance Level of Assistance  Bathing, Feeding, Dressing Bathing Assistance: Limited assistance Feeding assistance: Independent Dressing Assistance: Limited assistance     Functional Limitations Info  Sight, Hearing, Speech Sight Info: Adequate Hearing Info: Impaired Speech Info: Adequate    SPECIAL CARE FACTORS FREQUENCY  PT (By licensed PT)     PT Frequency:  (2-3 (home health [facility]) )              Contractures      Additional Factors Info  Code Status, Allergies Code Status Info:  (DNR ) Allergies Info:  (Loratadine, Other, Sulfa Antibiotics, Sulfasalazine, Tape)           Current Medications (11/27/2015):  This is the current hospital active medication list Current Facility-Administered Medications  Medication Dose Route Frequency Provider Last Rate Last Dose  . [MAR Hold] acetaminophen (TYLENOL) tablet 650 mg  650 mg Oral Q6H PRN Nicholes Mango, MD   650 mg at 11/24/15 1725   Or  . [MAR Hold] acetaminophen (TYLENOL) suppository 650 mg  650 mg Rectal Q6H PRN Nicholes Mango, MD      . Mikaela.Ping Hold] antiseptic oral rinse (CPC / CETYLPYRIDINIUM CHLORIDE 0.05%) solution 7 mL  7 mL Mouth Rinse BID  Hillary Bow, MD   7 mL at 11/27/15 1000  . [MAR Hold] carvedilol (COREG) tablet 12.5 mg  12.5 mg Oral BID WC Nicholes Mango, MD   12.5 mg at 11/27/15 0848  . [MAR Hold] docusate sodium (COLACE) capsule 100 mg  100 mg Oral BID Nicholes Mango, MD   100 mg at 11/26/15 2129  . fentaNYL (SUBLIMAZE) 100 MCG/2ML injection           . fentaNYL (SUBLIMAZE) injection 25-50 mcg  25-50 mcg Intravenous Q5 min PRN Andria Frames, MD   12.5 mcg at 11/27/15  1322  . [MAR Hold] hydrocerin (EUCERIN) cream   Topical BID Nicholes Mango, MD      . Doug Sou Hold] insulin aspart (novoLOG) injection 0-15 Units  0-15 Units Subcutaneous TID WC Hillary Bow, MD   0 Units at 11/25/15 1200  . [MAR Hold] levothyroxine (SYNTHROID, LEVOTHROID) tablet 88 mcg  88 mcg Oral QAC breakfast Nicholes Mango, MD   88 mcg at 11/27/15 0841  . [MAR Hold] meclizine (ANTIVERT) tablet 25 mg  25 mg Oral Daily Nicholes Mango, MD   25 mg at 11/26/15 0834  . [MAR Hold] mometasone-formoterol (DULERA) 200-5 MCG/ACT inhaler 2 puff  2 puff Inhalation BID Nicholes Mango, MD   2 puff at 11/26/15 2129  . [MAR Hold] morphine 2 MG/ML injection 2 mg  2 mg Intravenous Q4H PRN Nicholes Mango, MD      . Doug Sou Hold] ondansetron (ZOFRAN) tablet 4 mg  4 mg Oral Q6H PRN Nicholes Mango, MD       Or  . Doug Sou Hold] ondansetron (ZOFRAN) injection 4 mg  4 mg Intravenous Q6H PRN Nicholes Mango, MD      . Doug Sou Hold] oxyCODONE (Oxy IR/ROXICODONE) immediate release tablet 5 mg  5 mg Oral Q4H PRN Nicholes Mango, MD   5 mg at 11/25/15 0909  . oxyCODONE (Oxy IR/ROXICODONE) immediate release tablet 5 mg  5 mg Oral Once PRN Andria Frames, MD       Or  . oxyCODONE (ROXICODONE) 5 MG/5ML solution 5 mg  5 mg Oral Once PRN Andria Frames, MD      . Doug Sou Hold] pantoprazole (PROTONIX) EC tablet 40 mg  40 mg Oral Daily Nicholes Mango, MD   40 mg at 11/26/15 0834  . [MAR Hold] polyethylene glycol (MIRALAX / GLYCOLAX) packet 17 g  17 g Oral Daily PRN Nicholes Mango, MD      . Doug Sou Hold] spironolactone (ALDACTONE) tablet 12.5 mg  12.5 mg Oral Daily Nicholes Mango, MD   12.5 mg at 11/26/15 0835  . [MAR Hold] tiotropium (SPIRIVA) inhalation capsule 18 mcg  18 mcg Inhalation Daily Nicholes Mango, MD   18 mcg at 11/26/15 0840  . [MAR Hold] triamcinolone cream (KENALOG) 0.1 %   Topical BID Nicholes Mango, MD         Discharge Medications: Please see discharge summary for a list of discharge medications.  Relevant Imaging Results:  Relevant Lab  Results:   Additional Information  (SSN: [id number])  Loralyn Freshwater, LCSW

## 2015-11-27 NOTE — Anesthesia Preprocedure Evaluation (Signed)
Anesthesia Evaluation  Patient identified by MRN, date of birth, ID band Patient awake    Reviewed: Allergy & Precautions, H&P , NPO status , Patient's Chart, lab work & pertinent test results  History of Anesthesia Complications Negative for: history of anesthetic complications  Airway Mallampati: III  TM Distance: <3 FB Neck ROM: limited    Dental  (+) Poor Dentition, Missing, Chipped   Pulmonary shortness of breath and with exertion, COPD, former smoker,    Pulmonary exam normal breath sounds clear to auscultation       Cardiovascular Exercise Tolerance: Poor hypertension, (-) angina+ CAD, + Peripheral Vascular Disease, +CHF and + DOE  Normal cardiovascular examII Rhythm:regular Rate:Normal     Neuro/Psych PSYCHIATRIC DISORDERS Depression  Neuromuscular disease    GI/Hepatic Neg liver ROS, GERD  Controlled,  Endo/Other  diabetes, Type 2Hypothyroidism   Renal/GU Renal disease  negative genitourinary   Musculoskeletal   Abdominal   Peds  Hematology negative hematology ROS (+)   Anesthesia Other Findings Past Medical History:   Atrial fibrillation (HCC)                                      Comment:On coumadin    CHF (congestive heart failure) ([place])                           Comment:Diastolic -- echo 0000000 EF 60%moderate               diastolic dysfunction mild AS    Hyperlipidemia                                               Hypothyroidism                                               Diabetes mellitus                                              Comment:Type II    GERD (gastroesophageal reflux disease)                         Comment:Sleep study    Chest pain                                                     Comment:R/Od A. fib    Syncope                                         05-22-08 -*     Comment:[facility] syncope R/o'd ARI   PMR (polymyalgia rheumatica) (HCC)  Fracture of  multiple pubic rami ([place])                          Comment:[facility] 2013   Hypertension                                                 COPD (chronic obstructive pulmonary disease) (*              Depression                                                   Idiopathic peripheral neuropathy (HCC)                      Past Surgical History:   APPENDECTOMY                                     1945         PARTIAL HYSTERECTOMY                                            Comment:Ovaries intact    PLEURAL SCARIFICATION                                           Comment:Pleural Fluid B9  BMI    Body Mass Index   21.12 kg/m 2      Reproductive/Obstetrics negative OB ROS                             Anesthesia Physical Anesthesia Plan  ASA: IV  Anesthesia Plan: MAC   Post-op Pain Management:    Induction:   Airway Management Planned:   Additional Equipment:   Intra-op Plan:   Post-operative Plan:   Informed Consent: I have reviewed the patients History and Physical, chart, labs and discussed the procedure including the risks, benefits and alternatives for the proposed anesthesia with the patient or authorized representative who has indicated his/her understanding and acceptance.   Dental Advisory Given  Plan Discussed with: Anesthesiologist, CRNA and Surgeon  Anesthesia Plan Comments:         Anesthesia Quick Evaluation

## 2015-11-27 NOTE — Progress Notes (Signed)
Patient has been NPO since midnight. Patient denies pain. CHG bath complete

## 2015-11-27 NOTE — Progress Notes (Signed)
Pt transported back to [facility] via ems

## 2015-11-27 NOTE — Progress Notes (Signed)
Patient is medically stable for D/C back to [facility] today. Per [facility] patient can return today via EMS because it is too late for [facility] to arrange transportation. Clinical Education officer, museum (CSW) prepared D/C Packet and faxed D/C Summary, FL2 and prescription to [facility]. RN arranged EMS for transport. Patient is aware of above. CSW contacted patient's son Marcello Moores and made him aware of above. Please reconsult if future social work needs arise. CSW signing off.   Blima Rich, LCSW [phone]

## 2015-11-27 NOTE — Op Note (Signed)
11/24/2015 - 11/27/2015  1:11 PM  PATIENT:  Christine Stone  82 y.o. female  PRE-OPERATIVE DIAGNOSIS:  compression fracture T12   POST-OPERATIVE DIAGNOSIS:  compression fracture T12  PROCEDURE:  Procedure(s): KYPHOPLASTY T12  (N/A)  SURGEON: Laurene Footman, MD  ASSISTANTS: None  ANESTHESIA:   local and MAC  EBL:  Total I/O In: 300 [I.V.:300] Out: 0   BLOOD ADMINISTERED:none  DRAINS: none   LOCAL MEDICATIONS USED:  MARCAINE    and XYLOCAINE   SPECIMEN:  Source of Specimen:  T12 vertebral body  DISPOSITION OF SPECIMEN:  PATHOLOGY  COUNTS:  YES  TOURNIQUET:  * No tourniquets in log *  IMPLANTS: Bone cement  DICTATION: .Dragon Dictation patient brought the operating room and after adequate sedation was given, patient was placed prone. C-arm was brought in and good visualization of T12 was obtained in both AP and lateral projections. After patient identification and timeout procedure were completed 10 cc 1% Xylocaine was a low traded 5 cc on each side. Back was then prepped and draped in sterile fashion and repeat timeout procedure carried out. Spinal he was then placed on the right side down to the pedicle and a mixture of half percent Sensorcaine with epinephrine and 1% Xylocaine was infiltrated down to the pedicle. After allowing this to set a small incision was made and a trochars advanced through the pedicle into the vertebral body care being taken not to enter the neural foramen or the spinal canal. Biopsy was obtained. Next drilling was carried out and a balloon placed. This is inflated approximate 3 cc with partial correction of the kyphotic deformity. Bone cement was mixed and when it was the appropriate consistency the vertebral body was filled with approximately 40 half cc of bone cement getting good interdigitation without extravasation. Trochars removed and permanent C-arm views obtained. The wound was closed with Dermabond followed by a Band-Aid  PLAN OF CARE:  Continue as inpatient possibly home later today  PATIENT DISPOSITION:  PACU - hemodynamically stable.

## 2015-11-27 NOTE — Progress Notes (Signed)
incontient of urine changed depend  Moderate amt of urine

## 2015-11-27 NOTE — Clinical Social Work Note (Addendum)
Clinical Social Work Assessment  Patient Details  Name: Christine Stone MRN: [id number] Date of Birth: December 03, 1925  Date of referral:  11/27/15               Reason for consult:  Facility Placement, Other (Comment Required) (From [facility] )                Permission sought to share information with:  Chartered certified accountant granted to share information::  Yes, Verbal Permission Granted  Name::      [facility]   Agency::     Relationship::     Contact Information:     Housing/Transportation Living arrangements for the past 2 months:  [place] of Information:  Patient, [place], Adult Children Patient Interpreter Needed:  None Criminal Activity/Legal Involvement Pertinent to Current Situation/Hospitalization:  No - Comment as needed Significant Relationships:  Adult Children Lives with:  Facility Resident Do you feel safe going back to the place where you live?  Yes Need for family participation in patient care:  Yes (Comment)  Care giving concerns:  Patient is a long term care resident at [facility]. (fax # [phone])   Social Worker assessment / plan: Clinical Social Worker ([place]) received consult that patient is from [facility]. CSW contacted [facility] and spoke with [facility]. Per [facility] patient is independent and usually has a clear mental status. Per Onalee Hua patient has been a resident for 2 years and is on [place]. CSW asked Onalee Hua to call back with patient's Medicaid number because it is not in patient's chart at [facility]. Onalee Hua reported that patient is not on oxygen and can return to [facility] when stable. [place] requested [place] for home health. RN Case Manager aware of above. CSW met with patient alone at bedside. Patient was alert and oriented and was laying in the bed. Patient answered questions appropriately and reported that she wanted to return to [facility]. CSW met with patient's  son Marcello Moores [phone] and his wife outside of patient's room. CSW explained to son that PT will work with patient after kypho procedure today and make a recommendation of PT needs. CSW explained that patient is Medicare observation, which means that Medicare will not pay for STR at a SNF. Son verbalized his understanding and reported that he prefers for patient to return to [facility]. CSW will continue to follow and assist as needed.   Employment status:  Disabled (Comment on whether or not currently receiving Disability), Retired Forensic scientist:  Medicare PT Recommendations:  Not assessed at this time Information / Referral to community resources:  Other (Comment Required) (Patient will return to ALF )  Patient/Family's Response to care:  Patient and patient's son prefer for patient to return to [facility].   Patient/Family's Understanding of and Emotional Response to Diagnosis, Current Treatment, and Prognosis:  Patient and son were pleasant and thanked CSW for visit.   Emotional Assessment Appearance:  Appears stated age Attitude/Demeanor/Rapport:    Affect (typically observed):  Accepting, Adaptable, Pleasant Orientation:  Oriented to Self, Oriented to Place, Oriented to Situation, Fluctuating Orientation (Suspected and/or reported Sundowners) Alcohol / Substance use:  Not Applicable Psych involvement (Current and /or in the community):  No (Comment)  Discharge Needs  Concerns to be addressed:  Discharge Planning Concerns Readmission within the last 30 days:  No Current discharge risk:  Dependent with Mobility Barriers to Discharge:  Continued Medical Work  up   Loralyn Freshwater, LCSW 11/27/2015, 2:05 PM

## 2015-11-27 NOTE — NC FL2 (Signed)
[place] LEVEL OF CARE SCREENING TOOL     IDENTIFICATION  Patient Name: Christine Stone Birthdate: 10-Mar-1926 Sex: female Admission Date (Current Location): 11/24/2015  [place] and [place] Number:  Engineering geologist and Address:  [facility], [address], [place], [place] [address]      Provider Number: [id number]  Attending Physician Name and Address:  Hillary Bow, MD  Relative Name and Phone Number:       Current Level of Care: Hospital Recommended Level of Care: [place] Prior Approval Number:    Date Approved/Denied:   PASRR Number:  ( [id number] )  Discharge Plan: Domiciliary (Rest home)    Current Diagnoses: Patient Active Problem List   Diagnosis Date Noted  . Compression fracture of body of thoracic vertebra 11/24/2015  . Orthostatic hypotension 10/06/2014  . Dizziness 06/16/2014  . Chronic cough 06/16/2014  . Living will on file at physician's office 03/12/2013  . SOB (shortness of breath) 03/04/2013  . Skin lesion 12/07/2012  . Back pain 09/24/2012  . Pubic ramus fracture ([place]) 08/06/2012  . Hematuria 07/03/2012  . Feeling weak 04/12/2012  . PMR (polymyalgia rheumatica) (HCC) 02/11/2012  . AAA (abdominal aortic aneurysm) ([place]) 01/12/2012  . HYPERTENSION, BENIGN 02/21/2010  . Aortic valve stenosis 02/24/2009  . EMPHYSEMA 08/15/2008  . VASOVAGAL SYNCOPE 05/31/2008  . Chronic kidney disease, stage IV (severe) ([place]) 02/19/2008  . NEOPLASM UNCERTAIN BEHAVIOR PLEU THYMUS&MEDIAST 09/21/2007  . PULMONARY NODULE 09/21/2007  . ALLERGIC RHINITIS 02/25/2007  . HYPOTHYROIDISM 02/07/2007  . Hyperlipidemia 02/07/2007  . Diabetes mellitus type 2, controlled, without complications ([place]) [id number]  . ATRIAL FIBRILLATION -CHRONIC 02/06/2007  . CONGESTIVE HEART FAILURE 02/06/2007  . POSTMENOPAUSAL BLEEDING 02/06/2007    Orientation RESPIRATION BLADDER Height & Weight     Self, Place, Situation  Normal Incontinent Weight: 119 lb 3.2 oz (54.069 kg) Height:  5\' 3"  (160 cm)  BEHAVIORAL SYMPTOMS/MOOD NEUROLOGICAL BOWEL NUTRITION STATUS   (none )  (none ) Continent Carb Modified   AMBULATORY STATUS COMMUNICATION OF NEEDS Skin   Limited Assist  Verbally Surgical wounds (Back Incision. )                       Personal Care Assistance Level of Assistance  Bathing, Feeding, Dressing Bathing Assistance: Limited assistance Feeding assistance: Independent Dressing Assistance: Limited assistance     Functional Limitations Info  Sight, Hearing, Speech Sight Info: Adequate Hearing Info: Impaired Speech Info: Adequate    SPECIAL CARE FACTORS FREQUENCY  PT (By licensed PT)     PT Frequency:  (2-3 (home health [facility]) )              Contractures      Additional Factors Info  Code Status, Allergies Code Status Info:  (DNR ) Allergies Info:  (Loratadine, Other, Sulfa Antibiotics, Sulfasalazine, Tape)          Discharge Medications: Please see discharge summary for a list of discharge medications. DISCHARGE MEDICATIONS:   Current Discharge Medication List    START taking these medications   Details  oxyCODONE (OXY IR/ROXICODONE) 5 MG immediate release tablet Take 1 tablet (5 mg total) by mouth every 4 (four) hours as needed for severe pain. Qty: 30 tablet, Refills: 0      CONTINUE these medications which have NOT CHANGED   Details  budesonide-formoterol (SYMBICORT) 160-4.5 MCG/ACT inhaler Inhale 2 puffs into the lungs 2 (two) times daily.    carvedilol (COREG) 12.5  MG tablet Take 12.5 mg by mouth 2 (two) times daily with a meal.     Cranberry 450 MG TABS Take 450 mg by mouth daily.    ELIQUIS 5 MG TABS tablet Take 5 mg by mouth 2 (two) times daily.     flurandrenolide (CORDRAN) 0.05 % lotion Apply 1 application topically 2 (two) times daily. Apply to affected areas until clear.    levothyroxine (SYNTHROID, LEVOTHROID) 88 MCG  tablet Take 88 mcg by mouth daily before breakfast.    meclizine (ANTIVERT) 25 MG tablet Take 25 mg by mouth daily. Pt. Also takes one tablet daily as need for dizziness    Melatonin 3 MG TABS Take 3 mg by mouth at bedtime.    metFORMIN (GLUCOPHAGE) 500 MG tablet Take 500 mg by mouth 2 (two) times daily with a meal.     omeprazole (PRILOSEC) 20 MG capsule Take 20 mg by mouth daily.    SPIRIVA HANDIHALER 18 MCG inhalation capsule Place 18 mcg into inhaler and inhale daily.     spironolactone (ALDACTONE) 25 MG tablet Take 12.5 mg by mouth daily.    Sunscreens (VANICREAM SPF 30) CREA Apply 1 application topically 2 (two) times daily. Apply to entire body.              Relevant Imaging Results:  Relevant Lab Results:   Additional Information  (SSN: [id number])  Loralyn Freshwater, LCSW

## 2015-11-27 NOTE — Transfer of Care (Signed)
Immediate Anesthesia Transfer of Care Note  Patient: Christine Stone  Procedure(s) Performed: Procedure(s): KYPHOPLASTY T12  (N/A)  Patient Location: PACU  Anesthesia Type:General  Level of Consciousness: awake  Airway & Oxygen Therapy: Patient Spontanous Breathing and Patient connected to nasal cannula oxygen  Post-op Assessment: Report given to RN  Post vital signs: Reviewed  Last Vitals:  Filed Vitals:   11/27/15 1137 11/27/15 1310  BP: 118/83 134/66  Pulse: 83 87  Temp: 36.6 C 37.9 C  Resp: 18 16    Complications: No apparent anesthesia complications

## 2015-11-27 NOTE — Anesthesia Postprocedure Evaluation (Signed)
Anesthesia Post Note  Patient: Christine Stone  Procedure(s) Performed: Procedure(s) (LRB): KYPHOPLASTY T12  (N/A)  Patient location during evaluation: PACU Anesthesia Type: General Level of consciousness: awake and alert Pain management: pain level controlled Vital Signs Assessment: post-procedure vital signs reviewed and stable Respiratory status: spontaneous breathing, nonlabored ventilation, respiratory function stable and patient connected to nasal cannula oxygen Cardiovascular status: blood pressure returned to baseline and stable Postop Assessment: no signs of nausea or vomiting Anesthetic complications: no    Last Vitals:  Filed Vitals:   11/27/15 1404 11/27/15 1427  BP:  145/60  Pulse: 90 81  Temp:  36.6 C  Resp: 17 18    Last Pain:  Filed Vitals:   11/27/15 1427  PainSc: 0-No pain                 Precious Haws [place]it[place]

## 2015-11-28 LAB — SURGICAL PATHOLOGY

## 2016-03-10 ENCOUNTER — Inpatient Hospital Stay: Payer: Medicare Other

## 2016-03-10 ENCOUNTER — Inpatient Hospital Stay
Admission: EM | Admit: 2016-03-10 | Discharge: 2016-03-12 | DRG: 871 | Disposition: A | Discharge status: Hospice | Payer: Medicare Other | Attending: Internal Medicine | Admitting: Internal Medicine

## 2016-03-10 ENCOUNTER — Emergency Department: Payer: Medicare Other

## 2016-03-10 DIAGNOSIS — M549 Dorsalgia, unspecified: Secondary | ICD-10-CM | POA: Diagnosis present

## 2016-03-10 DIAGNOSIS — Z7901 Long term (current) use of anticoagulants: Secondary | ICD-10-CM

## 2016-03-10 DIAGNOSIS — M4854XA Collapsed vertebra, not elsewhere classified, thoracic region, initial encounter for fracture: Secondary | ICD-10-CM | POA: Diagnosis present

## 2016-03-10 DIAGNOSIS — M25552 Pain in left hip: Secondary | ICD-10-CM | POA: Diagnosis present

## 2016-03-10 DIAGNOSIS — I110 Hypertensive heart disease with heart failure: Secondary | ICD-10-CM | POA: Diagnosis present

## 2016-03-10 DIAGNOSIS — Z66 Do not resuscitate: Secondary | ICD-10-CM | POA: Diagnosis not present

## 2016-03-10 DIAGNOSIS — J449 Chronic obstructive pulmonary disease, unspecified: Secondary | ICD-10-CM | POA: Diagnosis present

## 2016-03-10 DIAGNOSIS — I482 Chronic atrial fibrillation: Secondary | ICD-10-CM | POA: Diagnosis present

## 2016-03-10 DIAGNOSIS — Z7984 Long term (current) use of oral hypoglycemic drugs: Secondary | ICD-10-CM | POA: Diagnosis not present

## 2016-03-10 DIAGNOSIS — J440 Chronic obstructive pulmonary disease with acute lower respiratory infection: Secondary | ICD-10-CM | POA: Diagnosis present

## 2016-03-10 DIAGNOSIS — E785 Hyperlipidemia, unspecified: Secondary | ICD-10-CM | POA: Diagnosis present

## 2016-03-10 DIAGNOSIS — E119 Type 2 diabetes mellitus without complications: Secondary | ICD-10-CM | POA: Diagnosis present

## 2016-03-10 DIAGNOSIS — J189 Pneumonia, unspecified organism: Secondary | ICD-10-CM | POA: Diagnosis present

## 2016-03-10 DIAGNOSIS — Z515 Encounter for palliative care: Secondary | ICD-10-CM

## 2016-03-10 DIAGNOSIS — E039 Hypothyroidism, unspecified: Secondary | ICD-10-CM | POA: Diagnosis present

## 2016-03-10 DIAGNOSIS — I5032 Chronic diastolic (congestive) heart failure: Secondary | ICD-10-CM | POA: Diagnosis present

## 2016-03-10 DIAGNOSIS — S3210XA Unspecified fracture of sacrum, initial encounter for closed fracture: Secondary | ICD-10-CM | POA: Diagnosis present

## 2016-03-10 DIAGNOSIS — A419 Sepsis, unspecified organism: Secondary | ICD-10-CM | POA: Diagnosis not present

## 2016-03-10 DIAGNOSIS — W19XXXA Unspecified fall, initial encounter: Secondary | ICD-10-CM | POA: Diagnosis present

## 2016-03-10 DIAGNOSIS — E871 Hypo-osmolality and hyponatremia: Secondary | ICD-10-CM | POA: Diagnosis present

## 2016-03-10 DIAGNOSIS — I4891 Unspecified atrial fibrillation: Secondary | ICD-10-CM | POA: Diagnosis not present

## 2016-03-10 DIAGNOSIS — N179 Acute kidney failure, unspecified: Secondary | ICD-10-CM | POA: Diagnosis present

## 2016-03-10 DIAGNOSIS — Z79891 Long term (current) use of opiate analgesic: Secondary | ICD-10-CM

## 2016-03-10 DIAGNOSIS — S22000A Wedge compression fracture of unspecified thoracic vertebra, initial encounter for closed fracture: Secondary | ICD-10-CM

## 2016-03-10 DIAGNOSIS — K219 Gastro-esophageal reflux disease without esophagitis: Secondary | ICD-10-CM | POA: Diagnosis present

## 2016-03-10 DIAGNOSIS — E860 Dehydration: Secondary | ICD-10-CM | POA: Diagnosis present

## 2016-03-10 DIAGNOSIS — M353 Polymyalgia rheumatica: Secondary | ICD-10-CM | POA: Diagnosis present

## 2016-03-10 DIAGNOSIS — Z79899 Other long term (current) drug therapy: Secondary | ICD-10-CM | POA: Diagnosis not present

## 2016-03-10 DIAGNOSIS — R509 Fever, unspecified: Secondary | ICD-10-CM

## 2016-03-10 DIAGNOSIS — M7918 Myalgia, other site: Secondary | ICD-10-CM

## 2016-03-10 DIAGNOSIS — I509 Heart failure, unspecified: Secondary | ICD-10-CM

## 2016-03-10 DIAGNOSIS — Z87891 Personal history of nicotine dependence: Secondary | ICD-10-CM

## 2016-03-10 DIAGNOSIS — Z888 Allergy status to other drugs, medicaments and biological substances status: Secondary | ICD-10-CM

## 2016-03-10 DIAGNOSIS — Z882 Allergy status to sulfonamides status: Secondary | ICD-10-CM

## 2016-03-10 LAB — COMPREHENSIVE METABOLIC PANEL
ALT: 9 U/L — AB (ref 14–54)
ALT: 8 U/L — ABNORMAL LOW (ref 14–54)
ANION GAP: 7 (ref 5–15)
AST: 15 U/L (ref 15–41)
AST: 15 U/L (ref 15–41)
Albumin: 3.7 g/dL (ref 3.5–5.0)
Albumin: 3.1 g/dL — ABNORMAL LOW (ref 3.5–5.0)
Alkaline Phosphatase: 73 U/L (ref 38–126)
Alkaline Phosphatase: 62 U/L (ref 38–126)
Anion gap: 9 (ref 5–15)
BUN: 29 mg/dL — ABNORMAL HIGH (ref 6–20)
BUN: 26 mg/dL — ABNORMAL HIGH (ref 6–20)
CALCIUM: 8.1 mg/dL — AB (ref 8.9–10.3)
CHLORIDE: 99 mmol/L — AB (ref 101–111)
CHLORIDE: 104 mmol/L (ref 101–111)
CO2: 24 mmol/L (ref 22–32)
CO2: 21 mmol/L — AB (ref 22–32)
CREATININE: 1.64 mg/dL — AB (ref 0.44–1.00)
Calcium: 9.2 mg/dL (ref 8.9–10.3)
Creatinine, Ser: 1.49 mg/dL — ABNORMAL HIGH (ref 0.44–1.00)
GFR calc non Af Amer: 30 mL/min — ABNORMAL LOW (ref >60)
GFR, EST AFRICAN AMERICAN: 31 mL/min — AB (ref >60)
GFR, EST AFRICAN AMERICAN: 35 mL/min — AB (ref >60)
GFR, EST NON AFRICAN AMERICAN: 27 mL/min — AB (ref >60)
Glucose, Bld: 118 mg/dL — ABNORMAL HIGH (ref 65–99)
Glucose, Bld: 152 mg/dL — ABNORMAL HIGH (ref 65–99)
POTASSIUM: 4.6 mmol/L (ref 3.5–5.1)
POTASSIUM: 4.1 mmol/L (ref 3.5–5.1)
SODIUM: 132 mmol/L — AB (ref 135–145)
Sodium: 132 mmol/L — ABNORMAL LOW (ref 135–145)
Total Bilirubin: 1 mg/dL (ref 0.3–1.2)
Total Bilirubin: 0.9 mg/dL (ref 0.3–1.2)
Total Protein: 7.6 g/dL (ref 6.5–8.1)
Total Protein: 6.2 g/dL — ABNORMAL LOW (ref 6.5–8.1)

## 2016-03-10 LAB — CBC WITH DIFFERENTIAL/PLATELET
BASOS ABS: 0 10*3/uL (ref 0–0.1)
BASOS PCT: 0 %
Basophils Absolute: 0 10*3/uL (ref 0–0.1)
Basophils Relative: 0 %
EOS ABS: 0 10*3/uL (ref 0–0.7)
EOS ABS: 0 10*3/uL (ref 0–0.7)
EOS PCT: 0 %
Eosinophils Relative: 0 %
HCT: 32.4 % — ABNORMAL LOW (ref 35.0–47.0)
HCT: 28.5 % — ABNORMAL LOW (ref 35.0–47.0)
HEMOGLOBIN: 10.6 g/dL — AB (ref 12.0–16.0)
Hemoglobin: 9.2 g/dL — ABNORMAL LOW (ref 12.0–16.0)
LYMPHS ABS: 1.1 10*3/uL (ref 1.0–3.6)
LYMPHS ABS: 0.9 10*3/uL — AB (ref 1.0–3.6)
Lymphocytes Relative: 6 %
Lymphocytes Relative: 5 %
MCH: 24.8 pg — AB (ref 26.0–34.0)
MCH: 24.6 pg — AB (ref 26.0–34.0)
MCHC: 32.6 g/dL (ref 32.0–36.0)
MCHC: 32.4 g/dL (ref 32.0–36.0)
MCV: 76.2 fL — ABNORMAL LOW (ref 80.0–100.0)
MCV: 76.1 fL — ABNORMAL LOW (ref 80.0–100.0)
MONOS PCT: 9 %
Monocytes Absolute: 1.4 10*3/uL — ABNORMAL HIGH (ref 0.2–0.9)
Monocytes Absolute: 1.6 10*3/uL — ABNORMAL HIGH (ref 0.2–0.9)
Monocytes Relative: 8 %
NEUTROS PCT: 86 %
Neutro Abs: 14.8 10*3/uL — ABNORMAL HIGH (ref 1.4–6.5)
Neutro Abs: 15.3 10*3/uL — ABNORMAL HIGH (ref 1.4–6.5)
Neutrophils Relative %: 86 %
PLATELETS: 187 10*3/uL (ref 150–440)
PLATELETS: 175 10*3/uL (ref 150–440)
RBC: 4.26 MIL/uL (ref 3.80–5.20)
RBC: 3.75 MIL/uL — ABNORMAL LOW (ref 3.80–5.20)
RDW: 17 % — ABNORMAL HIGH (ref 11.5–14.5)
RDW: 17.1 % — AB (ref 11.5–14.5)
WBC: 17.4 10*3/uL — AB (ref 3.6–11.0)
WBC: 17.8 10*3/uL — ABNORMAL HIGH (ref 3.6–11.0)

## 2016-03-10 LAB — PROTIME-INR
INR: 1.66
PROTHROMBIN TIME: 19.6 s — AB (ref 11.4–15.0)

## 2016-03-10 LAB — BRAIN NATRIURETIC PEPTIDE: B NATRIURETIC PEPTIDE 5: 464 pg/mL — AB (ref 0.0–100.0)

## 2016-03-10 LAB — APTT: aPTT: 37 seconds — ABNORMAL HIGH (ref 24–36)

## 2016-03-10 LAB — PROCALCITONIN: PROCALCITONIN: 0.18 ng/mL

## 2016-03-10 LAB — LACTIC ACID, PLASMA
Lactic Acid, Venous: 1.2 mmol/L (ref 0.5–1.9)
Lactic Acid, Venous: 0.7 mmol/L (ref 0.5–1.9)
Lactic Acid, Venous: 0.9 mmol/L (ref 0.5–1.9)

## 2016-03-10 LAB — MRSA PCR SCREENING: MRSA by PCR: POSITIVE — AB

## 2016-03-10 LAB — URINALYSIS COMPLETE WITH MICROSCOPIC (ARMC ONLY)
BACTERIA UA: NONE SEEN
Bilirubin Urine: NEGATIVE
GLUCOSE, UA: NEGATIVE mg/dL
KETONES UR: NEGATIVE mg/dL
LEUKOCYTES UA: NEGATIVE
NITRITE: NEGATIVE
Protein, ur: NEGATIVE mg/dL
SPECIFIC GRAVITY, URINE: 1.012 (ref 1.005–1.030)
Squamous Epithelial / LPF: NONE SEEN
pH: 7 (ref 5.0–8.0)

## 2016-03-10 LAB — TSH: TSH: 0.528 u[IU]/mL (ref 0.350–4.500)

## 2016-03-10 LAB — TROPONIN I: Troponin I: 0.03 ng/mL (ref <0.03)

## 2016-03-10 MED ORDER — TRAZODONE HCL 50 MG PO TABS: 25.0000 mg | ORAL_TABLET | Freq: Every evening | ORAL | Status: DC | PRN

## 2016-03-10 MED ORDER — BISACODYL 5 MG PO TBEC
5.0000 mg | DELAYED_RELEASE_TABLET | Freq: Every day | ORAL | Status: DC | PRN
Start: 2016-03-10 — End: 2016-03-12

## 2016-03-10 MED ORDER — SODIUM CHLORIDE 0.9 % IV BOLUS (SEPSIS)
500.0000 mL | Freq: Once | INTRAVENOUS | Status: AC
  Administered 2016-03-10: 500 mL via INTRAVENOUS

## 2016-03-10 MED ORDER — ENSURE ENLIVE PO LIQD
237.0000 mL | Freq: Three times a day (TID) | ORAL | Status: DC
  Administered 2016-03-10 – 2016-03-11 (×2): 237 mL via ORAL

## 2016-03-10 MED ORDER — CARVEDILOL 6.25 MG PO TABS
12.5000 mg | ORAL_TABLET | Freq: Two times a day (BID) | ORAL | Status: DC
  Administered 2016-03-10 – 2016-03-11 (×2): 12.5 mg via ORAL
  Filled 2016-03-10 (×2): qty 2

## 2016-03-10 MED ORDER — ACETAMINOPHEN 650 MG RE SUPP
975.0000 mg | Freq: Once | RECTAL | Status: AC
  Administered 2016-03-10: 975 mg via RECTAL
  Filled 2016-03-10: qty 1

## 2016-03-10 MED ORDER — PIPERACILLIN-TAZOBACTAM 3.375 G IVPB
3.3750 g | Freq: Once | INTRAVENOUS | Status: AC
  Administered 2016-03-10: 3.375 g via INTRAVENOUS
  Filled 2016-03-10: qty 50

## 2016-03-10 MED ORDER — SODIUM CHLORIDE 0.9% FLUSH
3.0000 mL | Freq: Two times a day (BID) | INTRAVENOUS | Status: DC
  Administered 2016-03-10 – 2016-03-11 (×3): 3 mL via INTRAVENOUS

## 2016-03-10 MED ORDER — DOCUSATE SODIUM 100 MG PO CAPS
100.0000 mg | ORAL_CAPSULE | Freq: Two times a day (BID) | ORAL | Status: DC
Start: 2016-03-10 — End: 2016-03-12
  Administered 2016-03-10 – 2016-03-12 (×5): 100 mg via ORAL
  Filled 2016-03-10 (×5): qty 1

## 2016-03-10 MED ORDER — CRANBERRY 450 MG PO TABS: 450.0000 mg | ORAL_TABLET | Freq: Every day | ORAL | Status: DC

## 2016-03-10 MED ORDER — SODIUM CHLORIDE 0.9 % IV BOLUS (SEPSIS)
1000.0000 mL | Freq: Once | INTRAVENOUS | Status: AC
  Administered 2016-03-10: 1000 mL via INTRAVENOUS

## 2016-03-10 MED ORDER — ONDANSETRON HCL 4 MG PO TABS: 4.0000 mg | ORAL_TABLET | Freq: Four times a day (QID) | ORAL | Status: DC | PRN

## 2016-03-10 MED ORDER — PANTOPRAZOLE SODIUM 40 MG PO TBEC
40.0000 mg | DELAYED_RELEASE_TABLET | Freq: Every day | ORAL | Status: DC
  Administered 2016-03-10 – 2016-03-11 (×2): 40 mg via ORAL
  Filled 2016-03-10 (×2): qty 1

## 2016-03-10 MED ORDER — ONDANSETRON HCL 4 MG/2ML IJ SOLN: 4.0000 mg | Freq: Four times a day (QID) | INTRAMUSCULAR | Status: DC | PRN

## 2016-03-10 MED ORDER — ESCITALOPRAM OXALATE 10 MG PO TABS
10.0000 mg | ORAL_TABLET | Freq: Every day | ORAL | Status: DC
  Administered 2016-03-10 – 2016-03-11 (×2): 10 mg via ORAL
  Filled 2016-03-10 (×2): qty 1

## 2016-03-10 MED ORDER — SODIUM CHLORIDE 0.9 % IV BOLUS (SEPSIS): 250.0000 mL | Freq: Once | INTRAVENOUS | Status: DC

## 2016-03-10 MED ORDER — SODIUM CHLORIDE 0.9 % IV BOLUS (SEPSIS): 1000.0000 mL | Freq: Once | INTRAVENOUS | Status: DC

## 2016-03-10 MED ORDER — VANCOMYCIN HCL IN DEXTROSE 1-5 GM/200ML-% IV SOLN
1000.0000 mg | Freq: Once | INTRAVENOUS | Status: AC
  Administered 2016-03-10: 1000 mg via INTRAVENOUS
  Filled 2016-03-10: qty 200

## 2016-03-10 MED ORDER — OXYCODONE HCL 5 MG PO TABS: 5.0000 mg | ORAL_TABLET | ORAL | Status: DC | PRN

## 2016-03-10 MED ORDER — VANCOMYCIN HCL IN DEXTROSE 1-5 GM/200ML-% IV SOLN: 1000.0000 mg | Freq: Once | INTRAVENOUS | Status: DC

## 2016-03-10 MED ORDER — LEVOTHYROXINE SODIUM 88 MCG PO TABS
88.0000 ug | ORAL_TABLET | Freq: Every day | ORAL | Status: DC
  Administered 2016-03-11 – 2016-03-12 (×2): 88 ug via ORAL
  Filled 2016-03-10 (×2): qty 1

## 2016-03-10 MED ORDER — TIOTROPIUM BROMIDE MONOHYDRATE 18 MCG IN CAPS
18.0000 ug | ORAL_CAPSULE | Freq: Every day | RESPIRATORY_TRACT | Status: DC
  Administered 2016-03-10 – 2016-03-11 (×2): 18 ug via RESPIRATORY_TRACT
  Filled 2016-03-10: qty 5

## 2016-03-10 MED ORDER — SODIUM CHLORIDE 0.9 % IV BOLUS (SEPSIS): 500.0000 mL | Freq: Once | INTRAVENOUS | Status: DC

## 2016-03-10 MED ORDER — ACETAMINOPHEN 650 MG RE SUPP: 650.0000 mg | Freq: Four times a day (QID) | RECTAL | Status: DC | PRN

## 2016-03-10 MED ORDER — VANCOMYCIN HCL IN DEXTROSE 750-5 MG/150ML-% IV SOLN
750.0000 mg | INTRAVENOUS | Status: DC
  Administered 2016-03-11: 750 mg via INTRAVENOUS
  Filled 2016-03-10 (×2): qty 150

## 2016-03-10 MED ORDER — DEXTROSE 5 % IV SOLN
2.0000 g | Freq: Every day | INTRAVENOUS | Status: DC
  Administered 2016-03-10: 2 g via INTRAVENOUS
  Filled 2016-03-10 (×2): qty 2

## 2016-03-10 MED ORDER — MOMETASONE FURO-FORMOTEROL FUM 200-5 MCG/ACT IN AERO
2.0000 | INHALATION_SPRAY | Freq: Two times a day (BID) | RESPIRATORY_TRACT | Status: DC
Start: 2016-03-10 — End: 2016-03-11
  Administered 2016-03-10 – 2016-03-11 (×3): 2 via RESPIRATORY_TRACT
  Filled 2016-03-10: qty 8.8

## 2016-03-10 MED ORDER — DEXTROSE 5 % IV SOLN: 2.0000 g | Freq: Once | INTRAVENOUS | Status: DC

## 2016-03-10 MED ORDER — CHLORHEXIDINE GLUCONATE CLOTH 2 % EX PADS
6.0000 | MEDICATED_PAD | Freq: Every day | CUTANEOUS | Status: DC
Start: 2016-03-11 — End: 2016-03-12
  Administered 2016-03-11 – 2016-03-12 (×2): 6 via TOPICAL

## 2016-03-10 MED ORDER — MUPIROCIN 2 % EX OINT
1.0000 "application " | TOPICAL_OINTMENT | Freq: Two times a day (BID) | CUTANEOUS | Status: DC
  Administered 2016-03-10 – 2016-03-11 (×3): 1 via NASAL
  Filled 2016-03-10: qty 22

## 2016-03-10 MED ORDER — MELATONIN 5 MG PO TABS
5.0000 mg | ORAL_TABLET | Freq: Every day | ORAL | Status: DC
  Administered 2016-03-10 – 2016-03-11 (×2): 5 mg via ORAL
  Filled 2016-03-10 (×2): qty 1

## 2016-03-10 MED ORDER — SODIUM CHLORIDE 0.9 % IV BOLUS (SEPSIS)
250.0000 mL | Freq: Once | INTRAVENOUS | Status: AC
  Administered 2016-03-10: 250 mL via INTRAVENOUS

## 2016-03-10 MED ORDER — APIXABAN 2.5 MG PO TABS
2.5000 mg | ORAL_TABLET | Freq: Two times a day (BID) | ORAL | Status: DC
  Administered 2016-03-10 – 2016-03-11 (×3): 2.5 mg via ORAL
  Filled 2016-03-10 (×3): qty 1

## 2016-03-10 MED ORDER — ACETAMINOPHEN 325 MG PO TABS
650.0000 mg | ORAL_TABLET | Freq: Four times a day (QID) | ORAL | Status: DC | PRN
  Administered 2016-03-10 – 2016-03-11 (×2): 650 mg via ORAL
  Filled 2016-03-10 (×2): qty 2

## 2016-03-10 NOTE — Progress Notes (Signed)
Pharmacy Antibiotic Note  Christine Stone is a 82 y.o. female admitted on 03/10/2016 with sepsis.  Pharmacy has been consulted for vancomycin and cefepime dosing.  Plan: Vancomycin 1000 mg iv once then vancomycin 750 IV every 36 hours with stacked dosing. Will hold off on trough for now, but will follow renal function closely and check levels/adjust dosing as clinically indicated  Goal trough 15-20 mcg/mL.  Zosyn 3.375 g iv once given in ED. Will begin cefepime 2 g iv q 24 hours subsequent to Zosyn.      Temp (24hrs), Avg:102.3 F (39.1 C), Min:102 F (38.9 C), Max:102.5 F (39.2 C)   Recent Labs Lab 03/10/16 0545  WBC 17.4*  CREATININE 1.64*  LATICACIDVEN 1.2    CrCl cannot be calculated (Unknown ideal weight.).    Allergies  Allergen Reactions  . Loratadine     REACTION: makes head feel funny  . Other     Allergic to bandaid adhesive- local recation  . Sulfa Antibiotics Other (See Comments)    Unknown reaction  . Sulfasalazine   . Tape     Local reaction to adhesive.      Antimicrobials this admission: Vancomycin 7/16 >>  Zosyn 7/16 >> x 1 Cefepime 7/16 >>   Dose adjustments this admission:   Microbiology results: 7/16 BCx: pending 7/16 UCx: pending    Thank you for allowing pharmacy to be a part of this patient's care.  Ulice Dash D 03/10/2016 7:42 AM

## 2016-03-10 NOTE — Progress Notes (Signed)
82 y/o F on apixaban 5 mg bid for afib.   Weight 58 kg, SCr: 1.64  Will adust dosing to 2.5 mg bid as appropriate for age, weight, and renal function.   Ulice Dash, PharmD Clinical Pharmacist

## 2016-03-10 NOTE — Progress Notes (Signed)
Pt admitted to floor for sepsis. Experiencing significant pain in left buttock when manipulating and turning. Dr Manuella Ghazi notified. Xray ordered of pelvis

## 2016-03-10 NOTE — Progress Notes (Signed)
CRITICAL VALUE ALERT  Critical value received:  Positive MRSA  Date of notification:  03/10/16  Time of notification:  1205PM  Critical value read back:  Nurse who received alert: Reign Dziuba   MD notified (1st page): DR Manuella Ghazi  Time of first page:  1207   Responding MD:  DR [facility]  Time MD responded:  [id number]

## 2016-03-10 NOTE — NC FL2 (Signed)
[place] MEDICAID FL2 LEVEL OF CARE SCREENING TOOL     IDENTIFICATION  Patient Name: Christine Stone Birthdate: [birth year] Sex: female Admission Date (Current Location): 03/10/2016  [place] and [place] Number:  Chiropodist and Address:  [facility], [address], [place], [place] [address]      Provider Number: [id number]  Attending Physician Name and Address:  Delfino Lovett, MD  Relative Name and Phone Number:       Current Level of Care: Hospital Recommended Level of Care: Skilled Nursing Facility Prior Approval Number:    Date Approved/Denied:   PASRR Number:  [id number])  Discharge Plan: SNF    Current Diagnoses: Patient Active Problem List   Diagnosis Date Noted  . Sepsis (HCC) 03/10/2016  . Compression fracture of body of thoracic vertebra 11/24/2015  . Orthostatic hypotension 10/06/2014  . Dizziness 06/16/2014  . Chronic cough 06/16/2014  . Living will on file at physician's office 03/12/2013  . SOB (shortness of breath) 03/04/2013  . Skin lesion 12/07/2012  . Back pain 09/24/2012  . Pubic ramus fracture (HCC) 08/06/2012  . Hematuria 07/03/2012  . Feeling weak 04/12/2012  . PMR (polymyalgia rheumatica) (HCC) 02/11/2012  . AAA (abdominal aortic aneurysm) (HCC) 01/12/2012  . HYPERTENSION, BENIGN 02/21/2010  . Aortic valve stenosis 02/24/2009  . EMPHYSEMA 08/15/2008  . VASOVAGAL SYNCOPE 05/31/2008  . Chronic kidney disease, stage IV (severe) (HCC) 02/19/2008  . NEOPLASM UNCERTAIN BEHAVIOR PLEU THYMUS&MEDIAST 09/21/2007  . PULMONARY NODULE 09/21/2007  . ALLERGIC RHINITIS 02/25/2007  . HYPOTHYROIDISM 02/07/2007  . Hyperlipidemia 02/07/2007  . Diabetes mellitus type 2, controlled, without complications (HCC) 02/06/2007  . ATRIAL FIBRILLATION -CHRONIC 02/06/2007  . CONGESTIVE HEART FAILURE 02/06/2007  . POSTMENOPAUSAL BLEEDING 02/06/2007    Orientation RESPIRATION BLADDER Height & Weight     Self  O2 (Nasal Cannula  2 (L/min) ) Continent Weight: 127 lb 14.4 oz (58.015 kg) Height:  5\' 2"  (157.5 cm)  BEHAVIORAL SYMPTOMS/MOOD NEUROLOGICAL BOWEL NUTRITION STATUS   (None)  (None) Continent Diet (Heart)  AMBULATORY STATUS COMMUNICATION OF NEEDS Skin   Limited Assist Verbally Normal                       Personal Care Assistance Level of Assistance  Bathing, Feeding, Dressing Bathing Assistance: Limited assistance Feeding assistance: Limited assistance Dressing Assistance: Limited assistance     Functional Limitations Info  Sight, Hearing, Speech Sight Info: Adequate Hearing Info: Impaired Speech Info: Adequate    SPECIAL CARE FACTORS FREQUENCY                       Contractures      Additional Factors Info  Code Status, Allergies Code Status Info:  (DNR) Allergies Info:  (Loratadine, Other, Sulfa Antibiotics, Sulfasalazine, Tape)           Current Medications (03/10/2016):  This is the current hospital active medication list Current Facility-Administered Medications  Medication Dose Route Frequency Provider Last Rate Last Dose  . acetaminophen (TYLENOL) tablet 650 mg  650 mg Oral Q6H PRN Delfino Lovett, MD       Or  . acetaminophen (TYLENOL) suppository 650 mg  650 mg Rectal Q6H PRN Delfino Lovett, MD      . apixaban (ELIQUIS) tablet 2.5 mg  2.5 mg Oral BID Delfino Lovett, MD      . bisacodyl (DULCOLAX) EC tablet 5 mg  5 mg Oral Daily PRN Delfino Lovett, MD      .  carvedilol (COREG) tablet 12.5 mg  12.5 mg Oral BID WC Vipul Sherryll Burger, MD      . ceFEPIme (MAXIPIME) 2 g in dextrose 5 % 50 mL IVPB  2 g Intravenous q1800 Delfino Lovett, MD      . docusate sodium (COLACE) capsule 100 mg  100 mg Oral BID Delfino Lovett, MD      . escitalopram (LEXAPRO) tablet 10 mg  10 mg Oral Daily Vipul Shah, MD      . feeding supplement (ENSURE ENLIVE) (ENSURE ENLIVE) liquid 237 mL  237 mL Oral TID BM Delfino Lovett, MD      . levothyroxine (SYNTHROID, LEVOTHROID) tablet 88 mcg  88 mcg Oral QAC breakfast Delfino Lovett, MD      .  Melatonin TABS 5 mg  5 mg Oral QHS Vipul Shah, MD      . mometasone-formoterol (DULERA) 200-5 MCG/ACT inhaler 2 puff  2 puff Inhalation BID Vipul Sherryll Burger, MD      . ondansetron (ZOFRAN) tablet 4 mg  4 mg Oral Q6H PRN Delfino Lovett, MD       Or  . ondansetron (ZOFRAN) injection 4 mg  4 mg Intravenous Q6H PRN Delfino Lovett, MD      . oxyCODONE (Oxy IR/ROXICODONE) immediate release tablet 5 mg  5 mg Oral Q4H PRN Delfino Lovett, MD      . pantoprazole (PROTONIX) EC tablet 40 mg  40 mg Oral Daily Vipul Shah, MD      . sodium chloride 0.9 % bolus 1,000 mL  1,000 mL Intravenous Once Delfino Lovett, MD       And  . sodium chloride 0.9 % bolus 500 mL  500 mL Intravenous Once Delfino Lovett, MD       And  . sodium chloride 0.9 % bolus 250 mL  250 mL Intravenous Once Delfino Lovett, MD      . sodium chloride flush (NS) 0.9 % injection 3 mL  3 mL Intravenous Q12H Delfino Lovett, MD      . tiotropium (SPIRIVA) inhalation capsule 18 mcg  18 mcg Inhalation Daily Delfino Lovett, MD      . traZODone (DESYREL) tablet 25 mg  25 mg Oral QHS PRN Delfino Lovett, MD      . Melene Muller ON 03/11/2016] vancomycin (VANCOCIN) IVPB 750 mg/150 ml premix  750 mg Intravenous Q36H Delfino Lovett, MD         Discharge Medications: Please see discharge summary for a list of discharge medications.  Relevant Imaging Results:  Relevant Lab Results:   Additional Information  (SSN [id number])  Verta Ellen Chanci Ojala, LCSW

## 2016-03-10 NOTE — H&P (Addendum)
[facility] [place] [facility]   PATIENT NAME: Christine Stone    MR#:  [id number]  DATE OF BIRTH:  1926-05-13  DATE OF ADMISSION:  03/10/2016  PRIMARY CARE PHYSICIAN: Pcp Not In System   REQUESTING/REFERRING PHYSICIAN: Irean Hong, MD  CHIEF COMPLAINT:   Chief Complaint  Patient presents with  . Back Pain  . Fever  . Tachycardia   HISTORY OF PRESENT ILLNESS:  Christine Stone  is a 82 y.o. female with a known history COPD, CHF, Atrial fibrillation on eliquis, diabetes brought in to ED from [facility] for fever, tachycardia and altered mental status.Complains of nonproductive cough and shortness of breath.  Christine Stone is very difficult of hearing.  I talked with her son via phone and he said that patient fell at [facility] yesterday, while walking with walker on her left side.  While in the emergency department.  Christine Stone has had fever as high as up to 102.5, Christine Stone was tachycardic with heart rate up to 140, tachypneic with Respiratory rate up to 31.  When I saw her Christine Stone was complaining of some pain, and Christine Stone seemed to be hurting in her left hip and is quite tender. PAST MEDICAL HISTORY:   Past Medical History  Diagnosis Date  . Atrial fibrillation (HCC)     On coumadin   . CHF (congestive heart failure) (HCC)     Diastolic -- echo 19/14 EF 60%moderate diastolic dysfunction mild AS   . Hyperlipidemia   . Hypothyroidism   . Diabetes mellitus     Type II   . GERD (gastroesophageal reflux disease)     Sleep study   . Chest pain     R/Od A. fib   . Syncope 05-22-08 - 05/23/08    Hosp [facility] syncope R/o'd ARI  . PMR (polymyalgia rheumatica) (HCC)   . Fracture of multiple pubic rami [facility])     [facility] 2013  . Hypertension   . COPD (chronic obstructive pulmonary disease) (HCC)   . Depression   . Idiopathic peripheral neuropathy (HCC)     PAST SURGICAL HISTORY:   Past Surgical History  Procedure Laterality Date  . Appendectomy  1945  . Partial hysterectomy     Ovaries intact   . Pleural scarification      Pleural Fluid B9  . Kyphoplasty N/A 11/27/2015    Procedure: KYPHOPLASTY T12 ;  Surgeon: Kennedy Bucker, MD;  Location: [facility] ORS;  Service: Orthopedics;  Laterality: N/A;    SOCIAL HISTORY:   Social History  Substance Use Topics  . Smoking status: Former Smoker -- 1.00 packs/day for 20 years    Types: Cigarettes    Quit date: 08/27/1990  . Smokeless tobacco: Never Used     Comment: 1/2 ppd x 40 years quit 1988.   Marland Kitchen Alcohol Use: No    FAMILY HISTORY:   Family History  Problem Relation Age of Onset  . Thrombosis Father 48    thrombosis of heart   . Coronary artery disease Father   . Cancer Sister   . Heart failure Sister   . Hypertension Sister   . Cancer Brother     kidney    DRUG ALLERGIES:   Allergies  Allergen Reactions  . Loratadine     REACTION: makes head feel funny  . Other     Allergic to bandaid adhesive- local recation  . Sulfa Antibiotics Other (See Comments)    Unknown reaction  . Sulfasalazine   .  Tape     Local reaction to adhesive.      REVIEW OF SYSTEMS:   Review of Systems  Constitutional: Positive for fever and malaise/fatigue. Negative for weight loss and diaphoresis.  HENT: Positive for hearing loss. Negative for ear discharge, ear pain, nosebleeds, sore throat and tinnitus.   Eyes: Negative for blurred vision and pain.  Respiratory: Positive for cough and shortness of breath. Negative for hemoptysis and wheezing.   Cardiovascular: Negative for chest pain, palpitations, orthopnea and leg swelling.  Gastrointestinal: Negative for heartburn, nausea, vomiting, abdominal pain, diarrhea, constipation and blood in stool.  Genitourinary: Negative for dysuria, urgency and frequency.  Musculoskeletal: Positive for myalgias, joint pain and falls. Negative for back pain.  Skin: Negative for itching and rash.  Neurological: Positive for weakness. Negative for dizziness, tingling, tremors, focal weakness,  seizures and headaches.  Endo/Heme/Allergies: Bruises/bleeds easily (on eliquis).  Psychiatric/Behavioral: Negative for depression. The patient is not nervous/anxious.    MEDICATIONS AT HOME:   Prior to Admission medications   Medication Sig Start Date End Date Taking? Authorizing Provider  carvedilol (COREG) 12.5 MG tablet Take 12.5 mg by mouth 2 (two) times daily with a meal.  05/19/14  Yes Historical Provider, MD  Cranberry 450 MG TABS Take 450 mg by mouth daily.   Yes Historical Provider, MD  ELIQUIS 5 MG TABS tablet Take 5 mg by mouth 2 (two) times daily.  06/02/14  Yes Historical Provider, MD  escitalopram (LEXAPRO) 10 MG tablet Take 10 mg by mouth daily.   Yes Historical Provider, MD  feeding supplement, ENSURE ENLIVE, (ENSURE ENLIVE) LIQD Take 237 mLs by mouth 3 (three) times daily between meals.   Yes Historical Provider, MD  flurandrenolide (CORDRAN) 0.05 % lotion Apply 1 application topically 2 (two) times daily. Apply to affected areas until clear.   Yes Historical Provider, MD  Fluticasone-Salmeterol (ADVAIR) 250-50 MCG/DOSE AEPB Inhale 1 puff into the lungs 2 (two) times daily.   Yes Historical Provider, MD  levothyroxine (SYNTHROID, LEVOTHROID) 88 MCG tablet Take 88 mcg by mouth daily before breakfast.   Yes Historical Provider, MD  meclizine (ANTIVERT) 25 MG tablet Take 25 mg by mouth daily. Pt. Also takes one tablet daily as need for dizziness   Yes Historical Provider, MD  Melatonin 3 MG TABS Take 3 mg by mouth at bedtime.   Yes Historical Provider, MD  metFORMIN (GLUCOPHAGE) 500 MG tablet Take 500 mg by mouth 2 (two) times daily with a meal.  06/06/14  Yes Historical Provider, MD  omeprazole (PRILOSEC) 20 MG capsule Take 20 mg by mouth daily.   Yes Historical Provider, MD  SPIRIVA HANDIHALER 18 MCG inhalation capsule Place 18 mcg into inhaler and inhale daily.  06/03/14  Yes Historical Provider, MD  spironolactone (ALDACTONE) 25 MG tablet Take 12.5 mg by mouth daily.   Yes  Historical Provider, MD  Sunscreens (VANICREAM SPF 30) CREA Apply 1 application topically 2 (two) times daily. Apply to entire body.   Yes Historical Provider, MD  oxyCODONE (OXY IR/ROXICODONE) 5 MG immediate release tablet Take 1 tablet (5 mg total) by mouth every 4 (four) hours as needed for severe pain. Patient not taking: Reported on 03/10/2016 11/27/15   Milagros Loll, MD      VITAL SIGNS:  Blood pressure 104/53, pulse 53, temperature 98.7 F (37.1 C), temperature source Axillary, resp. rate 20, weight 58.015 kg (127 lb 14.4 oz), SpO2 99 %. PHYSICAL EXAMINATION:  Physical Exam  Constitutional: Christine Stone is oriented to person, place,  and time. Christine Stone appears lethargic and dehydrated. Christine Stone appears unhealthy.  HENT:  Head: Normocephalic and atraumatic.  Right Ear: Decreased hearing is noted.  Left Ear: Decreased hearing is noted.  Eyes: Conjunctivae and EOM are normal. Pupils are equal, round, and reactive to light.  Neck: Normal range of motion. Neck supple. No tracheal deviation present. No thyromegaly present.  Cardiovascular: Normal rate, regular rhythm and normal heart sounds.   Pulmonary/Chest: Effort normal and breath sounds normal. No respiratory distress. Christine Stone has no wheezes. Christine Stone exhibits no tenderness.  Abdominal: Soft. Bowel sounds are normal. Christine Stone exhibits no distension. There is no tenderness.  Musculoskeletal:       Left hip: Christine Stone exhibits tenderness and bony tenderness.  Neurological: Christine Stone is oriented to person, place, and time. Christine Stone appears lethargic. No cranial nerve deficit.  Christine Stone is somewhat sleepy but real difficulty hearing which makes it difficult to examine her  Skin: Skin is warm and dry. No rash noted.  Psychiatric: Mood and affect normal.  Nursing note and vitals reviewed.  LABORATORY PANEL:   CBC  Recent Labs Lab 03/10/16 0545  WBC 17.4*  HGB 10.6*  HCT 32.4*  PLT 187    ------------------------------------------------------------------------------------------------------------------  Chemistries   Recent Labs Lab 03/10/16 0545  NA 132*  K 4.6  CL 99*  CO2 24  GLUCOSE 118*  BUN 29*  CREATININE 1.64*  CALCIUM 9.2  AST 15  ALT 9*  ALKPHOS 73  BILITOT 1.0   ------------------------------------------------------------------------------------------------------------------  Cardiac Enzymes  Recent Labs Lab 03/10/16 0545  TROPONINI 0.03*   ------------------------------------------------------------------------------------------------------------------  RADIOLOGY:  Dg Chest Port 1 View  03/10/2016  CLINICAL DATA:  Fever, tachycardia, and altered mental status. EXAM: PORTABLE CHEST 1 VIEW COMPARISON:  09/01/2013 FINDINGS: Cardiac enlargement without vascular congestion or edema. Emphysematous changes and scattered fibrosis in the lungs. Calcification and torsion of the aorta. No blunting of costophrenic angles. No pneumothorax. IMPRESSION: Cardiac enlargement. Emphysematous changes in the lungs. No evidence of active pulmonary disease. Electronically Signed   By: Burman Nieves M.D.   On: 03/10/2016 05:59   IMPRESSION AND PLAN:  82 y.o. female with a known history COPD, CHF, Atrial fibrillation on eliquis, diabetes brought in to ED from [facility] for fever, tachycardia and altered mental status  * Sepsis: Present on admission - Fever, tachycardia, tachypnea, altered mental status - No clear source with possible lung as Christine Stone is somewhat coughing - We will start IV Maxipime and vancomycin for possible healthcare acquired infection as Christine Stone is at assisted living - Her chest x-ray and urine looks clear for now  * Chronic Atrial fibrillation on Eliquis - Monitor on off unit telemetry - Rate is well controlled - Continue Eliquis  * Acute renal failure - Likely prerenal.  We will hydrate her with IV fluids and monitor her creatinine  *  Diabetes mellitus - Monitor blood sugar.  Hold off any oral hypoglycemics.  His blood sugar seems well controlled at this time  * Left hip pain - Status post fall - We will check x-ray to make sure there is no fracture  * Hypothyroidism - Check TSH - Continue levothyroxine  * Hyponatremia - Likely due to dehydration - We will hydrate and monitor her sodium      All the records are reviewed and case discussed with ED provider. Management plans discussed with the patient, family (son Christine Stone via phone) and nursing - they are in agreement.  CODE STATUS: DO NOT RESUSCITATE, consider palliative care if no improvement  TOTAL TIME (critical care) TAKING CARE OF THIS PATIENT: 45 minutes.    [facility], Kazimierz Springborn M.D on 03/10/2016 at 9:43 AM  Between 7am to 6pm - Pager - [phone]  After 6pm go to [email or web address] - Social research officer, government  [facility] [place] [facility]  Office  [phone]  CC: Primary care physician; Pcp Not In System   Note: This dictation was prepared with Dragon dictation along with smaller phrase technology. Any transcriptional errors that result from this process are unintentional.

## 2016-03-10 NOTE — Progress Notes (Signed)
PHARMACIST - PHYSICIAN ORDER COMMUNICATION  CONCERNING: P&T Medication Policy on Herbal Medications  DESCRIPTION:  This patient's order for:  cranberry  has been noted.  This product(s) is classified as an "herbal" or natural product. Due to a lack of definitive safety studies or FDA approval, nonstandard manufacturing practices, plus the potential risk of unknown drug-drug interactions while on inpatient medications, the Pharmacy and Therapeutics Committee does not permit the use of "herbal" or natural products of this type within [facility].   ACTION TAKEN: The pharmacy department is unable to verify this order at this time and your patient has been informed of this safety policy. Please reevaluate patient's clinical condition at discharge and address if the herbal or natural product(s) should be resumed at that time.  Ulice Dash, PharmD Clinical Pharmacist

## 2016-03-10 NOTE — Clinical Social Work Note (Signed)
Clinical Social Work Assessment  Patient Details  Name: BASYA GRUENBERG MRN: [id number] Date of Birth: 30-Jan-1926  Date of referral:  03/10/16               Reason for consult:  Discharge Planning                Permission sought to share information with:  Family Supports Permission granted to share information::  Yes, Verbal Permission Granted  Name::        Agency::   [facility])  Relationship::   Marcello Moores- Son/ POA)  Contact Information:     Housing/Transportation Living arrangements for the past 2 months:  [place] ([facility]) Source of Information:  Adult Children Marcello Moores- Son/ POA) Patient Interpreter Needed:  None Criminal Activity/Legal Involvement Pertinent to Current Situation/Hospitalization:  No - Comment as needed Significant Relationships:  Adult Children Lives with:  Facility Resident [facility]) Do you feel safe going back to the place where you live?  Yes Need for family participation in patient care:  Yes (Comment) Marcello Moores- Son/ POA)  Care giving concerns: Patient is from [place]    Social Worker assessment / plan:  CSW attempted to meet with patient at bedside. Patient would not awake. CSW contacted patient's son- Marcello Moores. Introduced herself and her role. Per patient's son he's patient's POA. He reports that he'd like patient to return to [facility] at discharge. He reports that he's getting surgery next week at [facility] and may not be able to answer his phone right away. Requested a voicemail. Per Marcello Moores patient is able to walk with a walker and feeds herself independently. Reported that she needs limited assistance with dressing and bathing. He reported that patient will need EMS to transport her at discharge. He reports that his mother has changed a lot over the past year. He reports that she hasn't been eating as much. CSW was not able to contact [facility]. Will re-attempt tomorrow 03/11/16. FL2/ PASRR completed  but will need to be updated once discharge medications have been updated. CSW Will continue to follow and assist.    Employment status:  Retired Forensic scientist:  Medicare PT Recommendations:  Not assessed at this time Information / Referral to community resources:     Patient/Family's Response to care:  Patient's family in agreement with patient to return to [facility] at discharge.   Patient/Family's Understanding of and Emotional Response to Diagnosis, Current Treatment, and Prognosis:  Patient's son reports he understands patient's diagnosis, current treatment and prognosis. Stated he appreciates CSW's assistance.   Emotional Assessment Appearance:  Appears stated age Attitude/Demeanor/Rapport:   (None) Affect (typically observed):  Calm, Pleasant Orientation:  Oriented to Self Alcohol / Substance use:  Not Applicable Psych involvement (Current and /or in the community):  No (Comment)  Discharge Needs  Concerns to be addressed:  Discharge Planning Concerns Readmission within the last 30 days:  No Current discharge risk:  Chronically ill Barriers to Discharge:  Continued Medical Work up   [employer], LCSW 03/10/2016, 11:52 AM

## 2016-03-10 NOTE — Consult Note (Signed)
Patient is a 82 year old seen after possible fall from nursing home. She had T12 kyphoplasty by me for 11/27/15. She is unable to give history secondary to confusion. She is nontender to log rolling of either leg anterior posterior and lateral compression of the pelvis. To palpation she only complaint is to pressure in the left SI area.  Review of x-rays and CT show no evidence of acute fracture. There is significant degenerative changes to her sacroiliac joints, no evidence of fracture of L5 or L4 on the reconstructions of the CT.  Impression is no acute orthopedic problem presumed bone bruise and recommend PT without restriction

## 2016-03-10 NOTE — ED Provider Notes (Signed)
[facility] Emergency Department Provider Note   ____________________________________________  Time seen: Approximately 5:09 AM  I have reviewed the triage vital signs and the nursing notes.   HISTORY  Chief Complaint Back Pain; Fever; and Tachycardia  History limited by altered mentation  HPI Christine Stone is a 82 y.o. female who presents to the ED from nursing facility for fever, tachycardia and altered mental status.Patient has a history of COPD, CHF, atrial fibrillation, diabetes. Complains of nonproductive cough and shortness of breath. Denies associated chest pain, abdominal pain, nausea, vomiting, diarrhea, dysuria. Denies recent travel or trauma. Nothing makes her symptoms better or worse.   Past Medical History  Diagnosis Date  . Atrial fibrillation ([place])     On coumadin   . CHF (congestive heart failure) (HCC)     Diastolic -- echo 0000000 EF [id number] diastolic dysfunction mild AS   . Hyperlipidemia   . Hypothyroidism   . Diabetes mellitus     Type II   . GERD (gastroesophageal reflux disease)     Sleep study   . Chest pain     R/Od A. fib   . Syncope 05-22-08 - 05/23/08    Hosp [facility] syncope R/o'd ARI  . PMR (polymyalgia rheumatica) (HCC)   . Fracture of multiple pubic rami [facility])     [place] 2013  . Hypertension   . COPD (chronic obstructive pulmonary disease) ([place])   . Depression   . Idiopathic peripheral neuropathy [facility])     Patient Active Problem List   Diagnosis Date Noted  . Compression fracture of body of thoracic vertebra 11/24/2015  . Orthostatic hypotension 10/06/2014  . Dizziness 06/16/2014  . Chronic cough 06/16/2014  . Living will on file at physician's office 03/12/2013  . SOB (shortness of breath) 03/04/2013  . Skin lesion 12/07/2012  . Back pain 09/24/2012  . Pubic ramus fracture ([place]) 08/06/2012  . Hematuria 07/03/2012  . Feeling weak 04/12/2012  . PMR (polymyalgia rheumatica) (HCC) 02/11/2012  . AAA  (abdominal aortic aneurysm) ([place]) 01/12/2012  . HYPERTENSION, BENIGN 02/21/2010  . Aortic valve stenosis 02/24/2009  . EMPHYSEMA 08/15/2008  . VASOVAGAL SYNCOPE 05/31/2008  . Chronic kidney disease, stage IV (severe) ([place]) 02/19/2008  . NEOPLASM UNCERTAIN BEHAVIOR PLEU THYMUS&MEDIAST 09/21/2007  . PULMONARY NODULE 09/21/2007  . ALLERGIC RHINITIS 02/25/2007  . HYPOTHYROIDISM 02/07/2007  . Hyperlipidemia 02/07/2007  . Diabetes mellitus type 2, controlled, without complications ([place]) [id number]  . ATRIAL FIBRILLATION -CHRONIC 02/06/2007  . CONGESTIVE HEART FAILURE 02/06/2007  . POSTMENOPAUSAL BLEEDING 02/06/2007    Past Surgical History  Procedure Laterality Date  . Appendectomy  1945  . Partial hysterectomy      Ovaries intact   . Pleural scarification      Pleural Fluid B9  . Kyphoplasty N/A 11/27/2015    Procedure: KYPHOPLASTY T12 ;  Surgeon: Hessie Knows, MD;  Location: [facility] ORS;  Service: Orthopedics;  Laterality: N/A;    Current Outpatient Rx  Name  Route  Sig  Dispense  Refill  . carvedilol (COREG) 12.5 MG tablet   Oral   Take 12.5 mg by mouth 2 (two) times daily with a meal.          . Cranberry 450 MG TABS   Oral   Take 450 mg by mouth daily.         Marland Kitchen ELIQUIS 5 MG TABS tablet   Oral   Take 5 mg by mouth 2 (two) times daily.          Marland Kitchen  escitalopram (LEXAPRO) 10 MG tablet   Oral   Take 10 mg by mouth daily.         . feeding supplement, ENSURE ENLIVE, (ENSURE ENLIVE) LIQD   Oral   Take 237 mLs by mouth 3 (three) times daily between meals.         . flurandrenolide (CORDRAN) 0.05 % lotion   Topical   Apply 1 application topically 2 (two) times daily. Apply to affected areas until clear.         . Fluticasone-Salmeterol (ADVAIR) 250-50 MCG/DOSE AEPB   Inhalation   Inhale 1 puff into the lungs 2 (two) times daily.         Marland Kitchen levothyroxine (SYNTHROID, LEVOTHROID) 88 MCG tablet   Oral   Take 88 mcg by mouth daily before breakfast.         .  meclizine (ANTIVERT) 25 MG tablet   Oral   Take 25 mg by mouth daily. Pt. Also takes one tablet daily as need for dizziness         . Melatonin 3 MG TABS   Oral   Take 3 mg by mouth at bedtime.         . metFORMIN (GLUCOPHAGE) 500 MG tablet   Oral   Take 500 mg by mouth 2 (two) times daily with a meal.          . omeprazole (PRILOSEC) 20 MG capsule   Oral   Take 20 mg by mouth daily.         Marland Kitchen SPIRIVA HANDIHALER 18 MCG inhalation capsule   Inhalation   Place 18 mcg into inhaler and inhale daily.          Marland Kitchen spironolactone (ALDACTONE) 25 MG tablet   Oral   Take 12.5 mg by mouth daily.         . Sunscreens (VANICREAM SPF 30) CREA   Apply externally   Apply 1 application topically 2 (two) times daily. Apply to entire body.         Marland Kitchen oxyCODONE (OXY IR/ROXICODONE) 5 MG immediate release tablet   Oral   Take 1 tablet (5 mg total) by mouth every 4 (four) hours as needed for severe pain. Patient not taking: Reported on 03/10/2016   30 tablet   0     Allergies Loratadine; Other; Sulfa antibiotics; Sulfasalazine; and Tape  Family History  Problem Relation Age of Onset  . Thrombosis Father 48    thrombosis of heart   . Coronary artery disease Father   . Cancer Sister   . Heart failure Sister   . Hypertension Sister   . Cancer Brother     kidney    Social History Social History  Substance Use Topics  . Smoking status: Former Smoker -- 1.00 packs/day for 20 years    Types: Cigarettes    Quit date: 08/27/1990  . Smokeless tobacco: Never Used     Comment: 1/2 ppd x 40 years quit 1988.   Marland Kitchen Alcohol Use: No    Review of Systems  Constitutional: No fever/chills. Eyes: No visual changes. ENT: No sore throat. Cardiovascular: Denies chest pain. Respiratory: Positive for cough and shortness of breath. Gastrointestinal: No abdominal pain.  No nausea, no vomiting.  No diarrhea.  No constipation. Genitourinary: Negative for dysuria. Musculoskeletal: Negative  for back pain. Skin: Negative for rash. Neurological: Negative for headaches, focal weakness or numbness.  10-point ROS otherwise negative.  ____________________________________________   PHYSICAL EXAM:  VITAL SIGNS: ED Triage  Vitals  Enc Vitals Group     BP --      Pulse Rate 03/10/16 0503 65     Resp 03/10/16 0503 31     Temp 03/10/16 0503 102.5 F (39.2 C)     Temp Source 03/10/16 0503 Rectal     SpO2 03/10/16 0503 96 %     Weight --      Height --      Head Cir --      Peak Flow --      Pain Score --      Pain Loc --      Pain Edu? --      Excl. in [place]? --     Constitutional: Alert and oriented. Chronically ill appearing and in mild acute distress. Eyes: Conjunctivae are normal. PERRL. EOMI. Head: Atraumatic. Nose: No congestion/rhinnorhea. Mouth/Throat: Mucous membranes are moist.  Oropharynx non-erythematous. Neck: No stridor.  Supple neck without meningismus. Cardiovascular: Tachycardiac rate, irregular rhythm. Grossly normal heart sounds.  Good peripheral circulation. Respiratory: Increased respiratory effort.  No retractions. Lungs with scattered rhonchi. Gastrointestinal: Soft and nontender. No distention. No abdominal bruits. No CVA tenderness. Musculoskeletal: No evidence of spinal abscess. No lower extremity tenderness nor edema.  No joint effusions. Neurologic:  Alert and oriented x 1. Normal speech and language. No gross focal neurologic deficits are appreciated.  Skin:  Skin is warm, dry and intact. No rash noted. No petechiae. Psychiatric: Mood and affect are normal. Speech and behavior are normal.  ____________________________________________   LABS (all labs ordered are listed, but only abnormal results are displayed)  Labs Reviewed  COMPREHENSIVE METABOLIC PANEL - Abnormal; Notable for the following:    Sodium 132 (*)    Chloride 99 (*)    Glucose, Bld 118 (*)    BUN 29 (*)    Creatinine, Ser 1.64 (*)    ALT 9 (*)    GFR calc non Af Amer 27  (*)    GFR calc Af Amer 31 (*)    All other components within normal limits  CBC WITH DIFFERENTIAL/PLATELET - Abnormal; Notable for the following:    WBC 17.4 (*)    Hemoglobin 10.6 (*)    HCT 32.4 (*)    MCV 76.2 (*)    MCH 24.8 (*)    RDW 17.0 (*)    Neutro Abs 14.8 (*)    Monocytes Absolute 1.4 (*)    All other components within normal limits  TROPONIN I - Abnormal; Notable for the following:    Troponin I 0.03 (*)    All other components within normal limits  URINALYSIS COMPLETEWITH MICROSCOPIC ([facility] ONLY) - Abnormal; Notable for the following:    Color, Urine YELLOW (*)    APPearance CLEAR (*)    Hgb urine dipstick 1+ (*)    All other components within normal limits  CULTURE, BLOOD (ROUTINE X 2)  CULTURE, BLOOD (ROUTINE X 2)  URINE CULTURE  LACTIC ACID, PLASMA  LACTIC ACID, PLASMA  BRAIN NATRIURETIC PEPTIDE   ____________________________________________  EKG  ED ECG REPORT I, Cheyne Bungert J, the attending physician, personally viewed and interpreted this ECG.   Date: 03/10/2016  EKG Time: 0533  Rate: 102  Rhythm: atrial fibrillation, rate 102  Axis: Normal  Intervals:none  ST&T Change: Nonspecific  ____________________________________________  RADIOLOGY  Chest x-ray (viewed by me, interpreted per Dr. Gerilyn Nestle): Cardiac enlargement. Emphysematous changes in the lungs. No evidence of active pulmonary disease. ____________________________________________   PROCEDURES  Procedure(s) performed: None  Procedures  Critical Care performed:   CRITICAL CARE Performed by: Paulette Blanch   Total critical care time: 30 minutes  Critical care time was exclusive of separately billable procedures and treating other patients.  Critical care was necessary to treat or prevent imminent or life-threatening deterioration.  Critical care was time spent personally by me on the following activities: development of treatment plan with patient and/or surrogate as well as  nursing, discussions with consultants, evaluation of patient's response to treatment, examination of patient, obtaining history from patient or surrogate, ordering and performing treatments and interventions, ordering and review of laboratory studies, ordering and review of radiographic studies, pulse oximetry and re-evaluation of patient's condition.  ____________________________________________   INITIAL IMPRESSION / ASSESSMENT AND PLAN / ED COURSE  Pertinent labs & imaging results that were available during my care of the patient were reviewed by me and considered in my medical decision making (see chart for details).  82 year old female sent by nursing facility for fever and tachycardia. ED code sepsis initiated immediately upon patient's arrival to the treatment room.  ----------------------------------------- 6:47 AM on 03/10/2016 -----------------------------------------  Oxygen applied for patient's saturations 86-87%. Loose sounding cough with rhonchi. Will initiate broad-spectrum antibiotics with vancomycin and Zosyn. Discussed with hospitalist to evaluate patient in the emergency department for admission. ____________________________________________   FINAL CLINICAL IMPRESSION(S) / ED DIAGNOSES  Final diagnoses:  Fever, unspecified fever cause  Sepsis, due to unspecified organism ([place])  Compression fracture of body of thoracic vertebra  Controlled type 2 diabetes mellitus without complication, without long-term current use of insulin (HCC)  Atrial fibrillation, unspecified type (HCC)  Congestive heart failure, unspecified congestive heart failure chronicity, unspecified congestive heart failure type ([place])      NEW MEDICATIONS STARTED DURING THIS VISIT:  New Prescriptions   No medications on file     Note:  This document was prepared using Dragon voice recognition software and may include unintentional dictation errors.    Paulette Blanch, MD 03/10/16 [phone]

## 2016-03-10 NOTE — Progress Notes (Addendum)
Pelvic x-ray shows Possible sacral fracture. Also Remote fractures of the left superior and inferior pubic rami. Will get CT pelvis and ortho c/s  D/w nursing. Will notify son after CT results.  C/s palliative care - overall poor prognosis.

## 2016-03-10 NOTE — ED Notes (Signed)
Pt presents w/ fever, intermittent tachycardia, and altered mental status. Son is present and is POA for her. Pt is unable to provide adequate health history at this time and appears very confused. Pt has dry mucous membranes.

## 2016-03-11 ENCOUNTER — Inpatient Hospital Stay: Payer: Medicare Other

## 2016-03-11 DIAGNOSIS — Z66 Do not resuscitate: Secondary | ICD-10-CM

## 2016-03-11 DIAGNOSIS — I4891 Unspecified atrial fibrillation: Secondary | ICD-10-CM

## 2016-03-11 DIAGNOSIS — Z515 Encounter for palliative care: Secondary | ICD-10-CM

## 2016-03-11 DIAGNOSIS — R509 Fever, unspecified: Secondary | ICD-10-CM | POA: Insufficient documentation

## 2016-03-11 LAB — BASIC METABOLIC PANEL
Anion gap: 7 (ref 5–15)
BUN: 30 mg/dL — ABNORMAL HIGH (ref 6–20)
CALCIUM: 8.6 mg/dL — AB (ref 8.9–10.3)
CHLORIDE: 107 mmol/L (ref 101–111)
CO2: 22 mmol/L (ref 22–32)
CREATININE: 1.52 mg/dL — AB (ref 0.44–1.00)
GFR calc non Af Amer: 30 mL/min — ABNORMAL LOW (ref >60)
GFR, EST AFRICAN AMERICAN: 34 mL/min — AB (ref >60)
Glucose, Bld: 114 mg/dL — ABNORMAL HIGH (ref 65–99)
Potassium: 4.3 mmol/L (ref 3.5–5.1)
SODIUM: 136 mmol/L (ref 135–145)

## 2016-03-11 LAB — PROTIME-INR
INR: 1.86
PROTHROMBIN TIME: 21.4 s — AB (ref 11.4–15.0)

## 2016-03-11 LAB — GLUCOSE, CAPILLARY: GLUCOSE-CAPILLARY: 105 mg/dL — AB (ref 65–99)

## 2016-03-11 LAB — CBC
HCT: 28 % — ABNORMAL LOW (ref 35.0–47.0)
HEMOGLOBIN: 9 g/dL — AB (ref 12.0–16.0)
MCH: 24.8 pg — AB (ref 26.0–34.0)
MCHC: 32.2 g/dL (ref 32.0–36.0)
MCV: 77 fL — ABNORMAL LOW (ref 80.0–100.0)
PLATELETS: 155 10*3/uL (ref 150–440)
RBC: 3.64 MIL/uL — AB (ref 3.80–5.20)
RDW: 16.9 % — ABNORMAL HIGH (ref 11.5–14.5)
WBC: 17.5 10*3/uL — AB (ref 3.6–11.0)

## 2016-03-11 LAB — HEMOGLOBIN A1C: Hgb A1c MFr Bld: 6 % (ref 4.0–6.0)

## 2016-03-11 MED ORDER — HYDROMORPHONE HCL 1 MG/ML IJ SOLN: 1.0000 mg | INTRAMUSCULAR | Status: DC | PRN

## 2016-03-11 MED ORDER — MORPHINE SULFATE (CONCENTRATE) 10 MG/0.5ML PO SOLN: 5.0000 mg | ORAL | Status: DC | PRN

## 2016-03-11 MED ORDER — LORAZEPAM 1 MG PO TABS: 1.0000 mg | ORAL_TABLET | Freq: Four times a day (QID) | ORAL | Status: DC | PRN

## 2016-03-11 NOTE — Consult Note (Signed)
Consultation Note Date: 03/11/2016   Patient Name: Christine Stone  DOB: 09-Oct-1925  MRN: [id number]  Age / Sex: 82 y.o., female  PCP: Pcp Not In System Referring Physician: Max Sane, MD  Reason for Consultation: Establishing goals of care, Non pain symptom management, Pain control and Psychosocial/spiritual support  82 yo female  admitted on 03/10/2016 with known history COPD, CHF, Atrial fibrillation on eliquis, diabetes brought in to ED from [facility] for fever, tachycardia and altered mental status.Complains of nonproductive cough and shortness of breath. She is very difficult of hearing. I talked with her son via phone and he said that patient fell [facility] [place] yesterday, while walking with walker on her left side. While in the emergency department. She has had fever as high as up to 102.5, she was tachycardic with heart rate up to 140, tachypneic with Respiratory rate up to 31. When I saw her she was complaining of some pain, and she seemed to be hurting in her left hip and is quite tender.  Son reports continued physical, functional and cognitive decline over the past three years.  Patient has documented desire for a natural death.  Clinical Assessment and Goals of Care:  Consult is for review of medical treatment options, clarification of goals of care and end of life issues, disposition and options, and symptom recommendation.  This NP Wadie Lessen reviewed medical records, received report from team, assessed the patient and then meet at the patient's bedside/ and spoke to son by telephone/ Christine Stone to discuss diagnosis prognosis, [place], EOL wishes disposition and options.   A detailed discussion was had today regarding advanced directives.  Concepts specific to code status, artifical feeding and hydration, continued IV antibiotics and rehospitalization was had.  The difference  between a aggressive medical intervention path  and a palliative comfort care path for this patient at this time was had.  Values and goals of care important to patient and family were attempted to be elicited.  Concept of Hospice and Palliative Care were discussed  Natural trajectory and expectations at EOL were discussed.  Questions and concerns addressed.   Family encouraged to call with questions or concerns.  PMT will continue to support holistically.    SUMMARY OF RECOMMENDATIONS    Code Status/Advance Care Planning:  DNR    Symptom Management:   Pain/Dyspnea: Roxanol 5 mg po/sl every 1 hr prn  Agitation/Anxiety: Ativan 1 mg po/sl every 6 hrs prn  Palliative Prophylaxis:   Aspiration, Bowel Regimen, Delirium Protocol, Frequent Pain Assessment and Oral Care  Additional Recommendations (Limitations, Scope, Preferences):  Full Comfort Care  Although patient is confused to time and place she speaks very clearly to her desire to not prolong her life un-naturally.  There is a documented "Desire for a Natural Death".. Her son/HPOA supports this understanding his mother's long term poor prognosis and continued loss of quality of life  Psycho-social/Spiritual:   Desire for further Chaplaincy support:yes  Additional Recommendations: Education on Hospice  Prognosis:   <  6 months  Discharge Planning: Back to AL/ Medicaid/ Home with Hospice      Primary Diagnoses: Present on Admission:  . Sepsis ([place])  I have reviewed the medical record, interviewed the patient and family, and examined the patient. The following aspects are pertinent.  Past Medical History  Diagnosis Date  . Atrial fibrillation ([place])     On coumadin   . CHF (congestive heart failure) (HCC)     Diastolic -- echo 0000000 EF [id number] diastolic dysfunction mild AS   . Hyperlipidemia   . Hypothyroidism   . Diabetes mellitus     Type II   . GERD (gastroesophageal reflux disease)     Sleep study   .  Chest pain     R/Od A. fib   . Syncope 05-22-08 - 05/23/08    Hosp [facility] syncope R/o'd ARI  . PMR (polymyalgia rheumatica) (HCC)   . Fracture of multiple pubic rami [facility])     [place] 2013  . Hypertension   . COPD (chronic obstructive pulmonary disease) ([place])   . Depression   . Idiopathic peripheral neuropathy [facility])    Social History   Social History  . Marital Status: Widowed    Spouse Name: N/A  . Number of Children: N/A  . Years of Education: N/A   Social History Main Topics  . Smoking status: Former Smoker -- 1.00 packs/day for 20 years    Types: Cigarettes    Quit date: 08/27/1990  . Smokeless tobacco: Never Used     Comment: 1/2 ppd x 40 years quit 1988.   Marland Kitchen Alcohol Use: No  . Drug Use: No  . Sexual Activity: Not Asked   Other Topics Concern  . None   Social History Narrative   Retired from [employer]   Married 1945   Family History  Problem Relation Age of Onset  . Thrombosis Father 48    thrombosis of heart   . Coronary artery disease Father   . Cancer Sister   . Heart failure Sister   . Hypertension Sister   . Cancer Brother     kidney   Scheduled Meds: . Chlorhexidine Gluconate Cloth  6 each Topical Q0600  . docusate sodium  100 mg Oral BID  . levothyroxine  88 mcg Oral QAC breakfast  . Melatonin  5 mg Oral QHS  . sodium chloride  1,000 mL Intravenous Once   And  . sodium chloride  500 mL Intravenous Once   And  . sodium chloride  250 mL Intravenous Once  . sodium chloride flush  3 mL Intravenous Q12H   Continuous Infusions:  PRN Meds:.acetaminophen **OR** acetaminophen, bisacodyl, LORazepam, morphine CONCENTRATE, ondansetron **OR** ondansetron (ZOFRAN) IV Medications Prior to Admission:  Prior to Admission medications   Medication Sig Start Date End Date Taking? Authorizing Provider  carvedilol (COREG) 12.5 MG tablet Take 12.5 mg by mouth 2 (two) times daily with a meal.  05/19/14  Yes Historical Provider, MD  Cranberry 450 MG TABS  Take 450 mg by mouth daily.   Yes Historical Provider, MD  ELIQUIS 5 MG TABS tablet Take 5 mg by mouth 2 (two) times daily.  06/02/14  Yes Historical Provider, MD  escitalopram (LEXAPRO) 10 MG tablet Take 10 mg by mouth daily.   Yes Historical Provider, MD  feeding supplement, ENSURE ENLIVE, (ENSURE ENLIVE) LIQD Take 237 mLs by mouth 3 (three) times daily between meals.   Yes Historical Provider, MD  flurandrenolide (CORDRAN) 0.05 % lotion Apply  1 application topically 2 (two) times daily. Apply to affected areas until clear.   Yes Historical Provider, MD  Fluticasone-Salmeterol (ADVAIR) 250-50 MCG/DOSE AEPB Inhale 1 puff into the lungs 2 (two) times daily.   Yes Historical Provider, MD  levothyroxine (SYNTHROID, LEVOTHROID) 88 MCG tablet Take 88 mcg by mouth daily before breakfast.   Yes Historical Provider, MD  meclizine (ANTIVERT) 25 MG tablet Take 25 mg by mouth daily. Pt. Also takes one tablet daily as need for dizziness   Yes Historical Provider, MD  Melatonin 3 MG TABS Take 3 mg by mouth at bedtime.   Yes Historical Provider, MD  metFORMIN (GLUCOPHAGE) 500 MG tablet Take 500 mg by mouth 2 (two) times daily with a meal.  06/06/14  Yes Historical Provider, MD  omeprazole (PRILOSEC) 20 MG capsule Take 20 mg by mouth daily.   Yes Historical Provider, MD  SPIRIVA HANDIHALER 18 MCG inhalation capsule Place 18 mcg into inhaler and inhale daily.  06/03/14  Yes Historical Provider, MD  spironolactone (ALDACTONE) 25 MG tablet Take 12.5 mg by mouth daily.   Yes Historical Provider, MD  Sunscreens (VANICREAM SPF 30) CREA Apply 1 application topically 2 (two) times daily. Apply to entire body.   Yes Historical Provider, MD  oxyCODONE (OXY IR/ROXICODONE) 5 MG immediate release tablet Take 1 tablet (5 mg total) by mouth every 4 (four) hours as needed for severe pain. Patient not taking: Reported on 03/10/2016 11/27/15   Hillary Bow, MD   Allergies  Allergen Reactions  . Loratadine     REACTION: makes head  feel funny  . Other     Allergic to bandaid adhesive- local recation  . Sulfa Antibiotics Other (See Comments)    Unknown reaction  . Sulfasalazine   . Tape     Local reaction to adhesive.     Review of Systems  Constitutional:       - c/o right hip pain  Neurological: Positive for weakness.    Physical Exam  Constitutional: She appears cachectic. She appears ill.  HENT:  Mouth/Throat: Oropharynx is clear and moist.  Pulmonary/Chest: She has decreased breath sounds in the right lower field and the left lower field.  Skin: Skin is warm and dry.  Psychiatric: Cognition and memory are impaired. She exhibits abnormal recent memory.  - patient is confused to time and place, she thinks she at at [facility] and the year 1979    Vital Signs: BP 104/72 mmHg  Pulse 98  Temp(Src) 100.5 F (38.1 C) (Oral)  Resp 20  Ht 5\' 2"  (1.575 m)  Wt 58.423 kg (128 lb 12.8 oz)  BMI 23.55 kg/m2  SpO2 95% Pain Assessment: Faces       SpO2: SpO2: 95 % O2 Device:SpO2: 95 % O2 Flow Rate: .O2 Flow Rate (L/min): 2 L/min  IO: Intake/output summary:  Intake/Output Summary (Last 24 hours) at 03/11/16 1205 Last data filed at 03/11/16 0100  Gross per 24 hour  Intake    240 ml  Output      0 ml  Net    240 ml    LBM:   Baseline Weight: Weight: 58.015 kg (127 lb 14.4 oz) Most recent weight: Weight: 58.423 kg (128 lb 12.8 oz)      Palliative Assessment/Data: 40 % at best   Discussed with Dr Manuella Ghazi  Time In: [id number] Time Out: 1200 Time Total: 75 min Greater than 50%  of this time was spent counseling and coordinating care related to the above assessment  and plan.  Signed by: Wadie Lessen, NP   Please contact Palliative Medicine Team phone at [phone] for questions and concerns.  For individual provider: See Shea Evans

## 2016-03-11 NOTE — Care Management Note (Signed)
Case Management Note  Patient Details  Name: Christine Stone MRN: [id number] Date of Birth: 07/25/1926  Subjective/Objective:     Spoke with son Christine Stone who is POA for patient over the phone and I have also discussed case with Dr Manuella Ghazi. Son explained that the plan at this time would be for patient to considered for hospice care. Palliative care is involved with care and also social work. Plan is now for patient to return to facility with hospice care. Son given my contact information and I will continue to follow patient for discharge needs.                 Action/Plan: Hospice care.    Expected Discharge Date:                  Expected Discharge Plan:  [place]  In-House Referral:  Clinical Social Work  Discharge planning Services  CM Consult  Post Acute Care Choice:    Choice offered to:     DME Arranged:    DME Agency:     HH Arranged:    [place] Agency:     Status of Service:  In process, will continue to follow  If discussed at Long Length of Stay Meetings, dates discussed:    Additional Comments:  Alvie Heidelberg, RN 03/11/2016, 1:19 PM

## 2016-03-11 NOTE — Progress Notes (Signed)
[facility] - [place] at [facility]   PATIENT NAME: Christine Stone    MR#:  [id number]  DATE OF BIRTH:  Jul 22, 1926  SUBJECTIVE:  CHIEF COMPLAINT:   Chief Complaint  Patient presents with  . Back Pain  . Fever  . Tachycardia    REVIEW OF SYSTEMS:  ROS  DRUG ALLERGIES:   Allergies  Allergen Reactions  . Loratadine     REACTION: makes head feel funny  . Other     Allergic to bandaid adhesive- local recation  . Sulfa Antibiotics Other (See Comments)    Unknown reaction  . Sulfasalazine   . Tape     Local reaction to adhesive.     VITALS:  Blood pressure 104/72, pulse 98, temperature 100.5 F (38.1 C), temperature source Oral, resp. rate 20, height 5\' 2"  (1.575 m), weight 58.423 kg (128 lb 12.8 oz), SpO2 95 %. PHYSICAL EXAMINATION:  Physical Exam LABORATORY PANEL:   CBC  Recent Labs Lab 03/11/16 0438  WBC 17.5*  HGB 9.0*  HCT 28.0*  PLT 155   ------------------------------------------------------------------------------------------------------------------ Chemistries   Recent Labs Lab 03/10/16 0929 03/11/16 0438  NA 132* 136  K 4.1 4.3  CL 104 107  CO2 21* 22  GLUCOSE 152* 114*  BUN 26* 30*  CREATININE 1.49* 1.52*  CALCIUM 8.1* 8.6*  AST 15  --   ALT 8*  --   ALKPHOS 62  --   BILITOT 0.9  --    RADIOLOGY:  Ct Pelvis Wo Contrast  03/10/2016  CLINICAL DATA:  Fell onto L side, eval fractures. EXAM: CT PELVIS WITHOUT CONTRAST TECHNIQUE: Multidetector CT imaging of the pelvis was performed following the standard protocol without intravenous contrast. COMPARISON:  Plain film of the pelvis 03/10/2016, ultrasound the abdomen 11/25/2015 FINDINGS: Bones appear osteopenic. There is no evidence for acute fracture or subluxation. There is deformity of the left superior and inferior pubic rami from previous injury. There is a saccular aneurysm of the abdominal aorta, measuring 3.2 by 3.9 cm, only partially imaged. There is dense atherosclerosis  of the iliac arteries. Urinary bladder has a normal appearance. Status post hysterectomy. There is a small amount of free pelvic fluid, of uncertain significance. IMPRESSION: 1. No evidence for acute fracture. 2. Significant atherosclerosis of the abdominal aorta and 3.9 cm abdominal aortic aneurysm. 3. Recommend followup by ultrasound in 2 years. This recommendation follows ACR consensus guidelines: White Paper of the ACR Incidental Findings Committee II on Vascular Findings. J Am Coll Radiol 2013; 10:789-794. Electronically Signed   By: Norva Pavlov M.D.   On: 03/10/2016 13:46   ASSESSMENT AND PLAN:  82 y.o. female with a known history COPD, CHF, Atrial fibrillation on eliquis, diabetes brought in to ED from [facility] ALF for fever, tachycardia and altered mental status  * Sepsis: Present on admission - Fever, tachycardia, tachypnea, altered mental status - Continue IV Maxipime and vancomycin for possible healthcare acquired infection as she is at assisted living - Repeat Chest x-ray concerning for pneumonia  * Chronic Atrial fibrillation on Eliquis - Monitor on off unit telemetry - Rate is well controlled - Stop Eliquis as she is at high risk for fall and bleeding  * Acute renal failure - Likely prerenal. Continue IV fluids and monitor her creatinine  * Diabetes mellitus - Monitor blood sugar. Hold off any oral hypoglycemics. His blood sugar seems well controlled at this time  * Left hip pain - Status post fall - No acute fracture, confirmed  by CT pelvis - Still having a lot of pain.  Will add IV Dilaudid for severe pain control, also on oxycodone for moderate pain - Appreciate orthopedic input  * Hypothyroidism - Normal TSH - Continue levothyroxine  * Hyponatremia - Likely due to dehydration - Improving with hydration   Patient has been going down hill for last several months as per Maisie Fus.  She is not eating and not participating in therapy either.   Maisie Fus is  patient's healthcare power of attorney.  She is wishing just wanting to be comfortable and not going through much aggressive care.  Son is in agreement.  Willing to consider hospice options.  If eligible, and qualifies.  I am leaning towards getting her back to [facility] with hospice if possible.  All the records are reviewed and case discussed with Care Management/Social Worker. Management plans discussed with the patient, family (discussed with son. Thomas at [phone], via phone) and they are in agreement.  CODE STATUS: DO NOT RESUSCITATE  TOTAL TIME TAKING CARE OF THIS PATIENT: 35 minutes.   More than 50% of the time was spent in counseling/coordination of care: YES  POSSIBLE D/C IN 1-2 DAYS, DEPENDING ON CLINICAL CONDITION.   [facility], Amel Kitch M.D on 03/11/2016 at 10:14 AM  Between 7am to 6pm - Pager - [phone]  After 6pm go to [email or web address] - Social research officer, government  [facility]  Office  [phone]  CC: Primary care physician; Pcp Not In System  Note: This dictation was prepared with Dragon dictation along with smaller phrase technology. Any transcriptional errors that result from this process are unintentional.

## 2016-03-11 NOTE — Care Management Important Message (Signed)
Important Message  Patient Details  Name: Christine Stone MRN: [id number] Date of Birth: 1925/10/06   Medicare Important Message Given:  Yes    Alvie Heidelberg, RN 03/11/2016, 10:37 AM

## 2016-03-11 NOTE — Clinical Documentation Improvement (Signed)
Internal Medicine  Can the diagnosis of Atrial Fibrillation be further specified? Please document findings in next progress note. Thank you!   Chronic Atrial fibrillation  Paroxysmal Atrial fibrillation  Permanent Atrial fibrillation  Persistent Atrial fibrillation  Other  Clinically Undetermined  Document any associated diagnoses/conditions.  Supporting Information:  Long term use of Eliquis 5 mg 2 times daily  Please exercise your independent, professional judgment when responding. A specific answer is not anticipated or expected.  Thank You,  Norm Parcel RN, BSN, [place] Clinical Documentation Specialist [facility] [phone]; cell [phone]

## 2016-03-12 LAB — URINE CULTURE

## 2016-03-12 MED ORDER — MORPHINE SULFATE (PF) 2 MG/ML IV SOLN
1.0000 mg | INTRAVENOUS | Status: AC
  Administered 2016-03-12: 1 mg via INTRAVENOUS
  Filled 2016-03-12: qty 1

## 2016-03-12 MED ORDER — LORAZEPAM 1 MG PO TABS: 1.0000 mg | ORAL_TABLET | ORAL | Status: DC | PRN

## 2016-03-12 MED ORDER — MORPHINE SULFATE (CONCENTRATE) 10 MG/0.5ML PO SOLN: 5.0000 mg | ORAL | Status: DC | PRN

## 2016-03-12 NOTE — Progress Notes (Signed)
New referral for [facility] services at [facility] following discharge received from [facility] following a Palliative Medicine consult with NP Wadie Lessen. Christine Stone is an 82 year old woman admitted from [facility]F on 7/16 for evaluation of increased temperature, tachycardia and altered mental status, also s/p fall on 7/15. Patient complained of left hip pain, xray showed possible sacral fracture, CT negative for fracture, chest xray showed PNA. Per chart note review attending physician Dr. Manuella Ghazi spoke with patient's son Christine Stone regarding his mother's condition, he reported a decline a functional decline over the past few months, poor appetite as well as patient's own desire for comfort vs aggressive care. Plan is for patient to return to [facility], medications have been adjusted to focus on comfort. Christine Stone confirmed this with a conversation with Palliative Medicine NP Wadie Lessen. Christine Stone was not available to speak with Probation officer regarding hospice services d/t to a previous appointment.  Patient seen lying in bed, intermittently confused, reporting pain to her left hip, she has only received tylenol for pain x 1 dose. Patient verbalized understanding of return to her ALF, writer did explain that a hospice nurse would be visiting her, she voiced agreement and stated that her husband had died last year at the hospice home. Patient information faxed to referral. Signed portable DNR in place in d/c packet. Patient to discharge today via EMS. Thank you. Flo Shanks RN, BSN, [place] and [facility] [place], [facility] [phone] c

## 2016-03-12 NOTE — NC FL2 (Signed)
[place] MEDICAID FL2 LEVEL OF CARE SCREENING TOOL     IDENTIFICATION  Patient Name: Christine Stone Birthdate: July 14, 1926 Sex: female Admission Date (Current Location): 03/10/2016  [place] and [place] Number:  Chiropodist and Address:  [facility], [address] [place], [place] [address]      Provider Number: [id number]  Attending Physician Name and Address:  Delfino Lovett, MD  Relative Name and Phone Number:       Current Level of Care: Hospital Recommended Level of Care: ALF  Prior Approval Number:    Date Approved/Denied:   PASRR Number:  [id number])  Discharge Plan: ALF     Current Diagnoses: Patient Active Problem List   Diagnosis Date Noted  . DNR (do not resuscitate) 03/11/2016  . Palliative care encounter 03/11/2016  . Pyrexia   . Sepsis (HCC) 03/10/2016  . Compression fracture of body of thoracic vertebra 11/24/2015  . Orthostatic hypotension 10/06/2014  . Dizziness 06/16/2014  . Chronic cough 06/16/2014  . Living will on file at physician's office 03/12/2013  . SOB (shortness of breath) 03/04/2013  . Skin lesion 12/07/2012  . Back pain 09/24/2012  . Pubic ramus fracture (HCC) 08/06/2012  . Hematuria 07/03/2012  . Feeling weak 04/12/2012  . PMR (polymyalgia rheumatica) (HCC) 02/11/2012  . AAA (abdominal aortic aneurysm) (HCC) 01/12/2012  . HYPERTENSION, BENIGN 02/21/2010  . Aortic valve stenosis 02/24/2009  . EMPHYSEMA 08/15/2008  . VASOVAGAL SYNCOPE 05/31/2008  . Chronic kidney disease, stage IV (severe) (HCC) 02/19/2008  . NEOPLASM UNCERTAIN BEHAVIOR PLEU THYMUS&MEDIAST 09/21/2007  . PULMONARY NODULE 09/21/2007  . ALLERGIC RHINITIS 02/25/2007  . HYPOTHYROIDISM 02/07/2007  . Hyperlipidemia 02/07/2007  . Diabetes mellitus type 2, controlled, without complications (HCC) 02/06/2007  . ATRIAL FIBRILLATION -CHRONIC 02/06/2007  . CONGESTIVE HEART FAILURE 02/06/2007  . POSTMENOPAUSAL BLEEDING 02/06/2007     Orientation RESPIRATION BLADDER Height & Weight     Self  Room air  Continent Weight: 128 lb 12.8 oz (58.423 kg) Height:  5\' 2"  (157.5 cm)  BEHAVIORAL SYMPTOMS/MOOD NEUROLOGICAL BOWEL NUTRITION STATUS   (None)  (None) Continent Diet (Heart)  AMBULATORY STATUS COMMUNICATION OF NEEDS Skin   Limited Assist Verbally Normal                       Personal Care Assistance Level of Assistance  Bathing, Feeding, Dressing Bathing Assistance: Limited assistance Feeding assistance: Limited assistance Dressing Assistance: Limited assistance     Functional Limitations Info  Sight, Hearing, Speech Sight Info: Adequate Hearing Info: Impaired Speech Info: Adequate    SPECIAL CARE FACTORS FREQUENCY   [facility] care to follow                     Contractures      Additional Factors Info  Code Status, Allergies Code Status Info:  (DNR) Allergies Info:  (Loratadine, Other, Sulfa Antibiotics, Sulfasalazine, Tape)          Discharge Medications: Please see discharge summary for a list of discharge medications. DISCHARGE MEDICATIONS:   Current Discharge Medication List    START taking these medications   Details  LORazepam (ATIVAN) 1 MG tablet Take 1 tablet (1 mg total) by mouth every 4 (four) hours as needed for anxiety. Qty: 10 tablet, Refills: 0    Morphine Sulfate (MORPHINE CONCENTRATE) 10 MG/0.5ML SOLN concentrated solution Take 0.25 mLs (5 mg total) by mouth every hour as needed for moderate pain, severe pain or shortness of  breath. Qty: 180 mL, Refills: 0      STOP taking these medications     carvedilol (COREG) 12.5 MG tablet      Cranberry 450 MG TABS      ELIQUIS 5 MG TABS tablet      escitalopram (LEXAPRO) 10 MG tablet      feeding supplement, ENSURE ENLIVE, (ENSURE ENLIVE) LIQD      flurandrenolide (CORDRAN) 0.05 % lotion      Fluticasone-Salmeterol (ADVAIR) 250-50 MCG/DOSE AEPB       levothyroxine (SYNTHROID, LEVOTHROID) 88 MCG tablet      meclizine (ANTIVERT) 25 MG tablet      Melatonin 3 MG TABS      metFORMIN (GLUCOPHAGE) 500 MG tablet      omeprazole (PRILOSEC) 20 MG capsule      SPIRIVA HANDIHALER 18 MCG inhalation capsule      spironolactone (ALDACTONE) 25 MG tablet      Sunscreens (VANICREAM SPF 30) CREA      oxyCODONE (OXY IR/ROXICODONE) 5 MG immediate release tablet           Relevant Imaging Results:  Relevant Lab Results:  Additional Information  (SSN [id number])  Chrisa Hassan, Ladon Applebaum, LCSW

## 2016-03-12 NOTE — Progress Notes (Addendum)
Patient is medically stable for D/C back to [facility] with [place] services. Per palliative NP patient's son is agreeable to hospice and prefers [place]/ [facility]. Clinical Education officer, museum (CSW) contacted [employer] ALF administrator who reported that patient can return today with hospice. CSW sent D/C orders to [facility]. RN will arrange EMS for transport. CSW left patient's son Marcello Moores a voicemail making him aware of above. Please reconsult if future social work needs arise. CSW signing off.   Patient's son Marcello Moores called CSW back this afternoon. Son is in agreement with the above plan.   [employer], LCSW [phone]

## 2016-03-12 NOTE — Discharge Summary (Signed)
[facility] - [place] at [facility]   PATIENT NAME: Christine Stone    MR#:  [id number]  DATE OF BIRTH:  07/04/1926  DATE OF ADMISSION:  03/10/2016 ADMITTING PHYSICIAN: Delfino Lovett, MD  DATE OF DISCHARGE: No discharge date for patient encounter.  PRIMARY CARE PHYSICIAN: Pcp Not In System    ADMISSION DIAGNOSIS:  Sepsis, due to unspecified organism (HCC) [A41.9] Fever, unspecified fever cause [R50.9] Compression fracture of body of thoracic vertebra [W09.81XB] Congestive heart failure, unspecified congestive heart failure chronicity, unspecified congestive heart failure type (HCC) [I50.9] Controlled type 2 diabetes mellitus without complication, without long-term current use of insulin (HCC) [E11.9] Atrial fibrillation, unspecified type (HCC) [I48.91]  DISCHARGE DIAGNOSIS:  Active Problems:   Sepsis (HCC)   DNR (do not resuscitate)   Palliative care encounter   Pyrexia   SECONDARY DIAGNOSIS:   Past Medical History  Diagnosis Date  . Atrial fibrillation (HCC)     On coumadin   . CHF (congestive heart failure) (HCC)     Diastolic -- echo 14/78 EF 60%moderate diastolic dysfunction mild AS   . Hyperlipidemia   . Hypothyroidism   . Diabetes mellitus     Type II   . GERD (gastroesophageal reflux disease)     Sleep study   . Chest pain     R/Od A. fib   . Syncope 05-22-08 - 05/23/08    H[facility] syncope R/o'd ARI  . PMR (polymyalgia rheumatica) (HCC)   . Fracture of multiple pubic rami [facility])     [facility] 2013  . Hypertension   . COPD (chronic obstructive pulmonary disease) (HCC)   . Depression   . Idiopathic peripheral neuropathy [facility])     HOSPITAL COURSE:  82 y.o. female with a known history COPD, CHF, Atrial fibrillation on eliquis, diabetes brought in to ED from [facility] for fever, tachycardia and altered mental status  * Sepsis: Present on admission - Likely due to pneumonia  * Chronic Atrial fibrillation on Eliquis  * Acute  renal failure - Likely prerenal  * Left hip pain - Status post fall - No acute fracture, confirmed by CT pelvis -Pain control for now  * Hyponatremia - Likely due to dehydration - Improving with hydration  Patient's overall prognosis very poor, long discussion was held with patient's son, Christine Fus, who has her healthcare power of attorney.  Palliative care consultation was also obtained.  Family agreed for hospice while at the [facility] care assisted living. DISCHARGE CONDITIONS:  Fair  CONSULTS OBTAINED:  Treatment Team:  Kennedy Bucker, MD DRUG ALLERGIES:   Allergies  Allergen Reactions  . Loratadine     REACTION: makes head feel funny  . Other     Allergic to bandaid adhesive- local recation  . Sulfa Antibiotics Other (See Comments)    Unknown reaction  . Sulfasalazine   . Tape     Local reaction to adhesive.      DISCHARGE MEDICATIONS:   Current Discharge Medication List    START taking these medications   Details  LORazepam (ATIVAN) 1 MG tablet Take 1 tablet (1 mg total) by mouth every 4 (four) hours as needed for anxiety. Qty: 10 tablet, Refills: 0    Morphine Sulfate (MORPHINE CONCENTRATE) 10 MG/0.5ML SOLN concentrated solution Take 0.25 mLs (5 mg total) by mouth every hour as needed for moderate pain, severe pain or shortness of breath. Qty: 180 mL, Refills: 0      STOP taking these medications  carvedilol (COREG) 12.5 MG tablet      Cranberry 450 MG TABS      ELIQUIS 5 MG TABS tablet      escitalopram (LEXAPRO) 10 MG tablet      feeding supplement, ENSURE ENLIVE, (ENSURE ENLIVE) LIQD      flurandrenolide (CORDRAN) 0.05 % lotion      Fluticasone-Salmeterol (ADVAIR) 250-50 MCG/DOSE AEPB      levothyroxine (SYNTHROID, LEVOTHROID) 88 MCG tablet      meclizine (ANTIVERT) 25 MG tablet      Melatonin 3 MG TABS      metFORMIN (GLUCOPHAGE) 500 MG tablet      omeprazole (PRILOSEC) 20 MG capsule      SPIRIVA HANDIHALER 18 MCG inhalation capsule        spironolactone (ALDACTONE) 25 MG tablet      Sunscreens (VANICREAM SPF 30) CREA      oxyCODONE (OXY IR/ROXICODONE) 5 MG immediate release tablet        DISCHARGE INSTRUCTIONS:    DIET:  Regular diet  DISCHARGE CONDITION:  Fair  ACTIVITY:  Activity as tolerated  OXYGEN:  Home Oxygen: No.   Oxygen Delivery: room air  DISCHARGE LOCATION:  [facility] assisted living with hospice   If you experience worsening of your admission symptoms, develop shortness of breath, life threatening emergency, suicidal or homicidal thoughts you must seek medical attention immediately by calling 911 or calling your MD immediately  if symptoms less severe.  You Must read complete instructions/literature along with all the possible adverse reactions/side effects for all the Medicines you take and that have been prescribed to you. Take any new Medicines after you have completely understood and accpet all the possible adverse reactions/side effects.   Please note  You were cared for by a hospitalist during your hospital stay. If you have any questions about your discharge medications or the care you received while you were in the hospital after you are discharged, you can call the unit and asked to speak with the hospitalist on call if the hospitalist that took care of you is not available. Once you are discharged, your primary care physician will handle any further medical issues. Please note that NO REFILLS for any discharge medications will be authorized once you are discharged, as it is imperative that you return to your primary care physician (or establish a relationship with a primary care physician if you do not have one) for your aftercare needs so that they can reassess your need for medications and monitor your lab values.    On the day of Discharge:  VITAL SIGNS:  Blood pressure 144/74, pulse 99, temperature 98.6 F (37 C), temperature source Oral, resp. rate 19, height 5\' 2"  (1.575 m),  weight 58.423 kg (128 lb 12.8 oz), SpO2 97 %. PHYSICAL EXAMINATION:  Constitutional: She appears cachectic. She appears ill.  HENT:  Mouth/Throat: Oropharynx is clear and moist.  Pulmonary/Chest: She has decreased breath sounds in the right lower field and the left lower field.  Skin: Skin is warm and dry.  Psychiatric: Cognition and memory are impaired. She exhibits abnormal recent memory.  - patient is confused to time and place. DATA REVIEW:   CBC  Recent Labs Lab 03/11/16 0438  WBC 17.5*  HGB 9.0*  HCT 28.0*  PLT 155    Chemistries   Recent Labs Lab 03/10/16 0929 03/11/16 0438  NA 132* 136  K 4.1 4.3  CL 104 107  CO2 21* 22  GLUCOSE 152* 114*  BUN 26* 30*  CREATININE 1.49* 1.52*  CALCIUM 8.1* 8.6*  AST 15  --   ALT 8*  --   ALKPHOS 62  --   BILITOT 0.9  --      Management plans discussed with the patient, family and they are in agreement.  CODE STATUS: DO NOT RESUSCITATE comfort care and hospice  If unable to take care of her at assisted living.  She may need transfer to hospice home.  TOTAL TIME TAKING CARE OF THIS PATIENT: 45 minutes.    [facility], Jamil Castillo M.D on 03/12/2016 at 7:30 AM  Between 7am to 6pm - Pager - [phone]  After 6pm go to [email or web address] - password [email or web address]  [facility]  Office  [phone]  CC: Primary care physician; Pcp Not In System   Note: This dictation was prepared with Dragon dictation along with smaller phrase technology. Any transcriptional errors that result from this process are unintentional.

## 2016-03-15 LAB — CULTURE, BLOOD (ROUTINE X 2)
CULTURE: NO GROWTH
CULTURE: NO GROWTH
CULTURE: NO GROWTH
CULTURE: NO GROWTH

## 2016-05-23 ENCOUNTER — Telehealth: Payer: Self-pay | Admitting: Cardiovascular Disease

## 2016-05-23 NOTE — Telephone Encounter (Signed)
3 attempts to schedule fu from recall.   Per Son, Patient may not be interested in scheduling an appt.  Will call if she decides to be seen.  Deleting recall.

## 2016-07-29 ENCOUNTER — Encounter: Payer: Self-pay | Admitting: *Deleted

## 2016-07-29 ENCOUNTER — Emergency Department
Admission: EM | Admit: 2016-07-29 | Discharge: 2016-07-29 | Disposition: A | Discharge status: Home / Self Care | Payer: Medicare Other | Attending: Emergency Medicine | Admitting: Emergency Medicine

## 2016-07-29 DIAGNOSIS — E039 Hypothyroidism, unspecified: Secondary | ICD-10-CM | POA: Insufficient documentation

## 2016-07-29 DIAGNOSIS — W182XXA Fall in (into) shower or empty bathtub, initial encounter: Secondary | ICD-10-CM | POA: Diagnosis not present

## 2016-07-29 DIAGNOSIS — N184 Chronic kidney disease, stage 4 (severe): Secondary | ICD-10-CM | POA: Insufficient documentation

## 2016-07-29 DIAGNOSIS — Z87891 Personal history of nicotine dependence: Secondary | ICD-10-CM | POA: Insufficient documentation

## 2016-07-29 DIAGNOSIS — Y92002 Bathroom of unspecified non-institutional (private) residence single-family (private) house as the place of occurrence of the external cause: Secondary | ICD-10-CM | POA: Diagnosis not present

## 2016-07-29 DIAGNOSIS — I509 Heart failure, unspecified: Secondary | ICD-10-CM | POA: Diagnosis not present

## 2016-07-29 DIAGNOSIS — S41111A Laceration without foreign body of right upper arm, initial encounter: Secondary | ICD-10-CM | POA: Insufficient documentation

## 2016-07-29 DIAGNOSIS — J449 Chronic obstructive pulmonary disease, unspecified: Secondary | ICD-10-CM | POA: Diagnosis not present

## 2016-07-29 DIAGNOSIS — Y9389 Activity, other specified: Secondary | ICD-10-CM | POA: Diagnosis not present

## 2016-07-29 DIAGNOSIS — S4992XA Unspecified injury of left shoulder and upper arm, initial encounter: Secondary | ICD-10-CM | POA: Diagnosis present

## 2016-07-29 DIAGNOSIS — I130 Hypertensive heart and chronic kidney disease with heart failure and stage 1 through stage 4 chronic kidney disease, or unspecified chronic kidney disease: Secondary | ICD-10-CM | POA: Diagnosis not present

## 2016-07-29 DIAGNOSIS — Y999 Unspecified external cause status: Secondary | ICD-10-CM | POA: Insufficient documentation

## 2016-07-29 DIAGNOSIS — E1122 Type 2 diabetes mellitus with diabetic chronic kidney disease: Secondary | ICD-10-CM | POA: Insufficient documentation

## 2016-07-29 DIAGNOSIS — S8011XA Contusion of right lower leg, initial encounter: Secondary | ICD-10-CM | POA: Insufficient documentation

## 2016-07-29 DIAGNOSIS — W19XXXA Unspecified fall, initial encounter: Secondary | ICD-10-CM

## 2016-07-29 NOTE — ED Notes (Signed)
Skin tears on right arm were cleaned with NS and dressed with a non-adherent dressing and wrapped with kling.

## 2016-07-29 NOTE — ED Provider Notes (Signed)
[facility] Emergency Department Provider Note ____________________________________________   I have reviewed the triage vital signs and the nursing notes.   HISTORY  Chief Complaint Fall   History limited by: Not Limited   HPI Christine Stone is a 82 y.o. female who presents from living facility today after a fall. The patient states she was in her bathroom when her walker caught part of the brain. She states she fell into the shower and sort of under the bench in the shower. Her right arm did hit the bench. She was down on the ground maybe 40 minutes before staff could it to her. She denies hitting her head or any loss of consciousness. She denies any pain in her neck or back. Denies any new pain in her hips (has some chronic pain in the left hip). No recent fever or illness.   Past Medical History:  Diagnosis Date  . Atrial fibrillation ([place])    On coumadin   . Chest pain    R/Od A. fib   . CHF (congestive heart failure) (HCC)    Diastolic -- echo 0000000 EF [id number] diastolic dysfunction mild AS   . COPD (chronic obstructive pulmonary disease) ([place])   . Depression   . Diabetes mellitus    Type II   . Fracture of multiple pubic rami [facility])    [place] 2013  . GERD (gastroesophageal reflux disease)    Sleep study   . Hyperlipidemia   . Hypertension   . Hypothyroidism   . Idiopathic peripheral neuropathy   . PMR (polymyalgia rheumatica) (HCC)   . Syncope 05-22-08 - 05/23/08   [facility] syncope R/o'd ARI    Patient Active Problem List   Diagnosis Date Noted  . DNR (do not resuscitate) 03/11/2016  . Palliative care encounter 03/11/2016  . Pyrexia   . Sepsis ([place]) 03/10/2016  . Compression fracture of body of thoracic vertebra ([place]) 11/24/2015  . Orthostatic hypotension 10/06/2014  . Dizziness 06/16/2014  . Chronic cough 06/16/2014  . Living will on file at physician's office 03/12/2013  . SOB (shortness of breath) 03/04/2013  . Skin lesion  12/07/2012  . Back pain 09/24/2012  . Pubic ramus fracture ([place]) 08/06/2012  . Hematuria 07/03/2012  . Feeling weak 04/12/2012  . PMR (polymyalgia rheumatica) (HCC) 02/11/2012  . AAA (abdominal aortic aneurysm) ([place]) 01/12/2012  . HYPERTENSION, BENIGN 02/21/2010  . Aortic valve stenosis 02/24/2009  . EMPHYSEMA 08/15/2008  . VASOVAGAL SYNCOPE 05/31/2008  . Chronic kidney disease, stage IV (severe) ([place]) 02/19/2008  . NEOPLASM UNCERTAIN BEHAVIOR PLEU THYMUS&MEDIAST 09/21/2007  . PULMONARY NODULE 09/21/2007  . ALLERGIC RHINITIS 02/25/2007  . HYPOTHYROIDISM 02/07/2007  . Hyperlipidemia 02/07/2007  . Diabetes mellitus type 2, controlled, without complications ([place]) [id number]  . ATRIAL FIBRILLATION -CHRONIC 02/06/2007  . CONGESTIVE HEART FAILURE 02/06/2007  . POSTMENOPAUSAL BLEEDING 02/06/2007    Past Surgical History:  Procedure Laterality Date  . APPENDECTOMY  1945  . KYPHOPLASTY N/A 11/27/2015   Procedure: KYPHOPLASTY T12 ;  Surgeon: Hessie Knows, MD;  Location: [facility] ORS;  Service: Orthopedics;  Laterality: N/A;  . PARTIAL HYSTERECTOMY     Ovaries intact   . PLEURAL SCARIFICATION     Pleural Fluid B9    Prior to Admission medications   Medication Sig Start Date End Date Taking? Authorizing Provider  LORazepam (ATIVAN) 1 MG tablet Take 1 tablet (1 mg total) by mouth every 4 (four) hours as needed for anxiety. 03/12/16   Max Sane, MD  Morphine Sulfate (  MORPHINE CONCENTRATE) 10 MG/0.5ML SOLN concentrated solution Take 0.25 mLs (5 mg total) by mouth every hour as needed for moderate pain, severe pain or shortness of breath. 03/12/16   Max Sane, MD    Allergies Loratadine; Other; Sulfa antibiotics; Sulfasalazine; and Tape  Family History  Problem Relation Age of Onset  . Thrombosis Father 48    thrombosis of heart   . Coronary artery disease Father   . Cancer Sister   . Heart failure Sister   . Hypertension Sister   . Cancer Brother     kidney    Social History Social  History  Substance Use Topics  . Smoking status: Former Smoker    Packs/day: 1.00    Years: 20.00    Types: Cigarettes    Quit date: 08/27/1990  . Smokeless tobacco: Never Used     Comment: 1/2 ppd x 40 years quit 1988.   Marland Kitchen Alcohol use No    Review of Systems  Constitutional: Negative for fever. Cardiovascular: Negative for chest pain. Respiratory: Negative for shortness of breath. Gastrointestinal: Negative for abdominal pain, vomiting and diarrhea. Genitourinary: Negative for dysuria. Musculoskeletal: Negative for back pain. Skin: Skin tears to the right arm, bruise to right leg. Neurological: Negative for headaches, focal weakness or numbness.  10-point ROS otherwise negative.  ____________________________________________   PHYSICAL EXAM:  VITAL SIGNS: ED Triage Vitals  Enc Vitals Group     BP 153/105     Pulse 110     Resp 17     Temp 98.2     Temp src      SpO2 100   Constitutional: Alert and oriented. Well appearing and in no distress. Eyes: Conjunctivae are normal. Normal extraocular movements. ENT   Head: Normocephalic and atraumatic.   Nose: No congestion/rhinnorhea.   Mouth/Throat: Mucous membranes are moist.   Neck: No stridor. No midline tenderness. Hematological/Lymphatic/Immunilogical: No cervical lymphadenopathy. Cardiovascular: Normal rate, regular rhythm.  No murmurs, rubs, or gallops.  Respiratory: Normal respiratory effort without tachypnea nor retractions. Breath sounds are clear and equal bilaterally. No wheezes/rales/rhonchi. Gastrointestinal: Soft and nontender. No distention.  Genitourinary: Deferred Musculoskeletal: Normal range of motion in all extremities. No deformity to right arm. No tenderness to deep palpation over ulna/radius/humerus. No wrist tenderness. Grip strength 5/5. No lower extremity edema. Neurologic:  Normal speech and language. No gross focal neurologic deficits are appreciated.  Skin:  Skin is warm. Skin  tear to upper right arm, skin tear to right lower arm. Bruise to right lower leg. Psychiatric: Mood and affect are normal. Speech and behavior are normal. Patient exhibits appropriate insight and judgment.  ____________________________________________    LABS (pertinent positives/negatives)  None  ____________________________________________   EKG  None  ____________________________________________    RADIOLOGY  None  ____________________________________________   PROCEDURES  Procedures  ____________________________________________   INITIAL IMPRESSION / ASSESSMENT AND PLAN / ED COURSE  Pertinent labs & imaging results that were available during my care of the patient were reviewed by me and considered in my medical decision making (see chart for details).  Patient presented to the emergency department today because of concerns for fall. Primary injury are skin tears to the right arm. No osseous tenderness on exam. No deformity. Patient with good grip strength. This point I have extremely low suspicion for fracture. Do not feel emergent x-rays are necessary. Will have nursing staff dress skin tears.   ____________________________________________   FINAL CLINICAL IMPRESSION(S) / ED DIAGNOSES  Final diagnoses:  Fall, initial encounter  Skin tear of right upper arm without complication, initial encounter     Note: This dictation was prepared with Dragon dictation. Any transcriptional errors that result from this process are unintentional    Nance Pear, MD 07/29/16 2320

## 2016-07-29 NOTE — ED Notes (Signed)
Patient ambulatory to and from room commode with slow, steady gait. Patient needs help with transitions.

## 2016-07-29 NOTE — ED Notes (Signed)
Patient is sleeping on the stretcher, waiting for discharge.

## 2016-07-29 NOTE — Discharge Instructions (Signed)
Please seek medical attention for any high fevers, chest pain, shortness of breath, change in behavior, persistent vomiting, bloody stool or any other new or concerning symptoms.  

## 2016-07-29 NOTE — ED Notes (Signed)
Patient was helped to room commode and back by ED tech.

## 2016-07-29 NOTE — ED Triage Notes (Signed)
Per EMS report, patient had a mechanical fall at [facility] when the wheels of her walker became stuck in the bathroom and she fell into the shower. Patient denies LOC or head pain. Patient c/o right arm pain and has skin tears on right forearm and right upper arm.

## 2016-09-09 ENCOUNTER — Emergency Department
Admission: EM | Admit: 2016-09-09 | Discharge: 2016-09-09 | Disposition: A | Discharge status: Home / Self Care | Payer: Medicare Other | Attending: Emergency Medicine | Admitting: Emergency Medicine

## 2016-09-09 DIAGNOSIS — Y939 Activity, unspecified: Secondary | ICD-10-CM | POA: Diagnosis not present

## 2016-09-09 DIAGNOSIS — S5002XA Contusion of left elbow, initial encounter: Secondary | ICD-10-CM | POA: Diagnosis not present

## 2016-09-09 DIAGNOSIS — Z87891 Personal history of nicotine dependence: Secondary | ICD-10-CM | POA: Insufficient documentation

## 2016-09-09 DIAGNOSIS — E039 Hypothyroidism, unspecified: Secondary | ICD-10-CM | POA: Diagnosis not present

## 2016-09-09 DIAGNOSIS — Y929 Unspecified place or not applicable: Secondary | ICD-10-CM | POA: Insufficient documentation

## 2016-09-09 DIAGNOSIS — E119 Type 2 diabetes mellitus without complications: Secondary | ICD-10-CM | POA: Insufficient documentation

## 2016-09-09 DIAGNOSIS — I5030 Unspecified diastolic (congestive) heart failure: Secondary | ICD-10-CM | POA: Diagnosis not present

## 2016-09-09 DIAGNOSIS — I110 Hypertensive heart disease with heart failure: Secondary | ICD-10-CM | POA: Insufficient documentation

## 2016-09-09 DIAGNOSIS — S59902A Unspecified injury of left elbow, initial encounter: Secondary | ICD-10-CM | POA: Diagnosis present

## 2016-09-09 DIAGNOSIS — W19XXXA Unspecified fall, initial encounter: Secondary | ICD-10-CM | POA: Insufficient documentation

## 2016-09-09 DIAGNOSIS — Y999 Unspecified external cause status: Secondary | ICD-10-CM | POA: Diagnosis not present

## 2016-09-09 DIAGNOSIS — T148XXA Other injury of unspecified body region, initial encounter: Secondary | ICD-10-CM

## 2016-09-09 MED ORDER — EUCERIN EX CREA: TOPICAL_CREAM | CUTANEOUS | 0 refills | Status: DC | PRN

## 2016-09-09 NOTE — ED Provider Notes (Addendum)
[facility] Emergency Department Provider Note  Time seen: 9:31 PM  I have reviewed the triage vital signs and the nursing notes.   HISTORY  Chief Complaint Rash    HPI Christine Stone is a 83 y.o. female with a past medical history. A fibrillation on Coumadin who presents to the emergency department with skin color changes to the left elbow. According to the patient she fell approximately one week ago but fell onto her right side. Denies any pain in the left elbow. Patient lives at a nursing facility and states one of the nurses noted the bruising to her left elbow and sent her to the ER for evaluation. Patient denies any pain in the elbow. Is able to range the elbow without difficulty. Denies any known falls or trauma to that side. Patient states it she skin but states that is chronic for her and she itches all over not just the elbow. Been told this is due to dry skin.  Past Medical History:  Diagnosis Date  . Atrial fibrillation ([place])    On coumadin   . Chest pain    R/Od A. fib   . CHF (congestive heart failure) (HCC)    Diastolic -- echo 0000000 EF [id number] diastolic dysfunction mild AS   . COPD (chronic obstructive pulmonary disease) ([place])   . Depression   . Diabetes mellitus    Type II   . Fracture of multiple pubic rami [facility])    [place] 2013  . GERD (gastroesophageal reflux disease)    Sleep study   . Hyperlipidemia   . Hypertension   . Hypothyroidism   . Idiopathic peripheral neuropathy   . PMR (polymyalgia rheumatica) (HCC)   . Syncope 05-22-08 - 05/23/08   [facility] syncope R/o'd ARI    Patient Active Problem List   Diagnosis Date Noted  . DNR (do not resuscitate) 03/11/2016  . Palliative care encounter 03/11/2016  . Pyrexia   . Sepsis ([place]) 03/10/2016  . Compression fracture of body of thoracic vertebra ([place]) 11/24/2015  . Orthostatic hypotension 10/06/2014  . Dizziness 06/16/2014  . Chronic cough 06/16/2014  . Living will on file  at physician's office 03/12/2013  . SOB (shortness of breath) 03/04/2013  . Skin lesion 12/07/2012  . Back pain 09/24/2012  . Pubic ramus fracture ([place]) 08/06/2012  . Hematuria 07/03/2012  . Feeling weak 04/12/2012  . PMR (polymyalgia rheumatica) (HCC) 02/11/2012  . AAA (abdominal aortic aneurysm) ([place]) 01/12/2012  . HYPERTENSION, BENIGN 02/21/2010  . Aortic valve stenosis 02/24/2009  . EMPHYSEMA 08/15/2008  . VASOVAGAL SYNCOPE 05/31/2008  . Chronic kidney disease, stage IV (severe) ([place]) 02/19/2008  . NEOPLASM UNCERTAIN BEHAVIOR PLEU THYMUS&MEDIAST 09/21/2007  . PULMONARY NODULE 09/21/2007  . ALLERGIC RHINITIS 02/25/2007  . HYPOTHYROIDISM 02/07/2007  . Hyperlipidemia 02/07/2007  . Diabetes mellitus type 2, controlled, without complications ([place]) [id number]  . ATRIAL FIBRILLATION -CHRONIC 02/06/2007  . CONGESTIVE HEART FAILURE 02/06/2007  . POSTMENOPAUSAL BLEEDING 02/06/2007    Past Surgical History:  Procedure Laterality Date  . APPENDECTOMY  1945  . KYPHOPLASTY N/A 11/27/2015   Procedure: KYPHOPLASTY T12 ;  Surgeon: Hessie Knows, MD;  Location: [facility] ORS;  Service: Orthopedics;  Laterality: N/A;  . PARTIAL HYSTERECTOMY     Ovaries intact   . PLEURAL SCARIFICATION     Pleural Fluid B9    Prior to Admission medications   Medication Sig Start Date End Date Taking? Authorizing Provider  LORazepam (ATIVAN) 1 MG tablet Take 1 tablet (1 mg  total) by mouth every 4 (four) hours as needed for anxiety. 03/12/16   Max Sane, MD  Morphine Sulfate (MORPHINE CONCENTRATE) 10 MG/0.5ML SOLN concentrated solution Take 0.25 mLs (5 mg total) by mouth every hour as needed for moderate pain, severe pain or shortness of breath. 03/12/16   Max Sane, MD    Allergies  Allergen Reactions  . Loratadine     REACTION: makes head feel funny  . Other     Allergic to bandaid adhesive- local recation  . Sulfa Antibiotics Other (See Comments)    Unknown reaction  . Sulfasalazine   . Tape     Local  reaction to adhesive.      Family History  Problem Relation Age of Onset  . Thrombosis Father 48    thrombosis of heart   . Coronary artery disease Father   . Cancer Sister   . Heart failure Sister   . Hypertension Sister   . Cancer Brother     kidney    Social History Social History  Substance Use Topics  . Smoking status: Former Smoker    Packs/day: 1.00    Years: 20.00    Types: Cigarettes    Quit date: 08/27/1990  . Smokeless tobacco: Never Used     Comment: 1/2 ppd x 40 years quit 1988.   Marland Kitchen Alcohol use No    Review of Systems Constitutional: Negative for fever. Cardiovascular: Negative for chest pain. Respiratory: Negative for shortness of breath. Gastrointestinal: Negative for abdominal pain Musculoskeletal: Negative for back pain. Skin: Bruising to left elbow Neurological: Negative for headache 10-point ROS otherwise negative.  ____________________________________________   PHYSICAL EXAM:  VITAL SIGNS: ED Triage Vitals  Enc Vitals Group     BP 09/09/16 2044 139/84     Pulse Rate 09/09/16 2044 74     Resp 09/09/16 2044 18     Temp 09/09/16 2044 98.2 F (36.8 C)     Temp Source 09/09/16 2044 Oral     SpO2 09/09/16 2044 100 %     Weight 09/09/16 2045 116 lb (52.6 kg)     Height 09/09/16 2045 5\' 2"  (1.575 m)     Head Circumference --      Peak Flow --      Pain Score --      Pain Loc --      Pain Edu? --      Excl. in [place]? --     Constitutional: Alert and oriented. Well appearing and in no distress. Eyes: Normal exam ENT   Head: Normocephalic and atraumatic   Mouth/Throat: Mucous membranes are moist. Cardiovascular: Normal rate, regular rhythm. 2/6 systolic ejection murmur Respiratory: Normal respiratory effort without tachypnea nor retractions. Breath sounds are clear Gastrointestinal: Soft and nontender. No distention.   Musculoskeletal: Nontender with normal range of motion in all extremities. Patient does have ecchymosis to the left  elbow most consistent with resolving hematoma. It appears to be at least several days old. Neurovascular intact distally. Neurologic:  Normal speech and language. No gross focal neurologic deficits Skin:  Skin is warm, dry and intact.  Psychiatric: Mood and affect are normal.   ____________________________________________   INITIAL IMPRESSION / ASSESSMENT AND PLAN / ED COURSE  Pertinent labs & imaging results that were available during my care of the patient were reviewed by me and considered in my medical decision making (see chart for details).  The patient presents the emergency department with skin color changes surrounding the left elbow. Patient states  he did not want to come to the ER but the nurse at the nursing facility made her come for evaluation. Denies any pain, is able to range the elbow and arm with no difficulty. Neurovascular intact distally. Elbow is consistent with resolving hematoma, the ecchymosis appears at least several days old, likely older. Nontender. Patient does have diffuse dry skin. I recommended a Eucerin ointment to help with her skin dryness.  ____________________________________________   FINAL CLINICAL IMPRESSION(S) / ED DIAGNOSES  Resolving hematoma    Harvest Dark, MD 09/09/16 [id number]    Harvest Dark, MD 09/09/16 2202

## 2016-09-09 NOTE — ED Notes (Signed)
Spoke with Ruthann Cancer about picking pt up here in the ED. Ulice Dash stated he "will pick her up." Notified pt.

## 2016-09-09 NOTE — ED Triage Notes (Signed)
Per EMS, pt from [facility] with complaints of itching to her left elbow. Pt reports falling last week however was seen here and the itching to her arm began before the fall. Notable redness and some swelling to left elbow. Pt A&O and in NAD at this. Pt states being on a blood thinner however is not sure the name of it. DNR paper at bedside.

## 2016-09-09 NOTE — ED Notes (Signed)
This nurse asked pt who to call for DC. Pt reports her son Marcello Moores or grandson Ulice Dash. Will try to call these individuals so pt can be driven back to [facility] with family.

## 2016-09-09 NOTE — ED Notes (Signed)
Pt's son arrived to take pt back to [facility]. [place] Poston) aware of pt returning with her son.

## 2016-09-09 NOTE — Discharge Instructions (Signed)
You have been seen in the emergency department for skin color changes surrounding the left elbow. As we discussed this is most consistent with a resolving/improving hematoma. This should resolve on its own. Please return to the emergency department for any pain to this area, increased swelling, or any other symptom personally concerning to yourself.

## 2016-10-08 ENCOUNTER — Ambulatory Visit: Payer: Medicare Other | Admitting: Cardiovascular Disease

## 2016-10-14 ENCOUNTER — Ambulatory Visit (INDEPENDENT_AMBULATORY_CARE_PROVIDER_SITE_OTHER): Payer: Medicare Other | Admitting: Cardiovascular Disease

## 2016-10-14 ENCOUNTER — Encounter: Payer: Self-pay | Admitting: Cardiovascular Disease

## 2016-10-14 VITALS — BP 112/60 | HR 77 | Ht 64.0 in | Wt 116.0 lb

## 2016-10-14 DIAGNOSIS — I350 Nonrheumatic aortic (valve) stenosis: Secondary | ICD-10-CM | POA: Diagnosis not present

## 2016-10-14 DIAGNOSIS — I951 Orthostatic hypotension: Secondary | ICD-10-CM | POA: Diagnosis not present

## 2016-10-14 DIAGNOSIS — R42 Dizziness and giddiness: Secondary | ICD-10-CM

## 2016-10-14 DIAGNOSIS — R0602 Shortness of breath: Secondary | ICD-10-CM

## 2016-10-14 DIAGNOSIS — I10 Essential (primary) hypertension: Secondary | ICD-10-CM

## 2016-10-14 DIAGNOSIS — I4891 Unspecified atrial fibrillation: Secondary | ICD-10-CM | POA: Diagnosis not present

## 2016-10-14 DIAGNOSIS — E782 Mixed hyperlipidemia: Secondary | ICD-10-CM

## 2016-10-14 MED ORDER — CARVEDILOL 3.125 MG PO TABS: 3.1250 mg | ORAL_TABLET | Freq: Two times a day (BID) | ORAL | 3 refills | Status: AC

## 2016-10-14 NOTE — Progress Notes (Signed)
Cardiology Office Note  Date:  10/14/2016   ID:  [name], DOB June 26, 1926, MRN [place]:[id number]  PCP:  Pcp Not In System   Chief Complaint  Patient presents with  . OTHER    C/o chest discomfort feels like "twinches". Pt hard of hearing and is not certain of PCP and pharmacy. Meds reviewed by medlist with pt.    HPI:  Christine Stone is a 83 year old woman with history of aortic valve stenosis, hard of hearing,  hypertension, COPD , prior smoking history , diabetes , chronic atrial fibrillation on anticoagulation , who presents for routine follow-up of her aortic valve stenosis And dizziness Previous echocardiogram showing moderate aortic valve stenosis  4 falls recently, lay on the floor 40 min Has a button on the wall, was not able to reach it Family brought her a whistle which is attached to her walker Lives in [facility] Lots of dizziness, when she gets up, has to sit for a period of time before she gets up She reports symptoms are getting so bad that she does not want to do anything anymore, Very nervous to walk around  Down 14 pounds in one year  Confused Today Poor historian, also hard of hearing Orthostatics not done in the office today secondary to gait instability  Lab work reviewed showing chronically elevated BUN and creatinine 30 and 1.5 to respectively She is also on meclizine for dizziness   EKG on today's visit shows atrial fibrillation with ventricular rate 77 bpm, no significant ST or T-wave changes  Other past medical history reviewed  Nursing home was checking her blood pressure at times, now not consistently. She had mild orthostasis on her last clinic visit, drop in systolic pressure from 0000000 supine down to 127 standing that improved back to 0000000 systolic after several minutes. Lisinopril previously held for low blood pressure. She is trying to drink more fluids   in the past did not want any workup for her aortic valve disease She does  not want even to be considered for surgery. She thinks her son would want her to have surgery, she does not want this "Parts are wearing out"  Notes indicate long history of atrial fibrillation with rate control, previously on warfarin, now on eliquis  5 mg twice a day.  Hospital admission January 2015 for COPD exacerbation, acute bronchitis treated with prednisone and antibiotics  Previous echocardiogram January 2015 showing mild to moderate aortic valve stenosis, low normal ejection fraction 50-55%, mild LVH, normal right ventricular systolic pressure lab work 06/05/2014 showing creatinine 1.69, BUN 26  Prior history of chronic cough. She feels that he has been worse this month. Of note amlodipine was held at the beginning of October and she was changed to lisinopril.    PMH:   has a past medical history of Atrial fibrillation (Christine Stone); Chest pain; CHF (congestive heart failure) (Christine Stone); COPD (chronic obstructive pulmonary disease) (Christine Stone); Depression; Diabetes mellitus; Fracture of multiple pubic rami (Christine Stone); GERD (gastroesophageal reflux disease); Hyperlipidemia; Hypertension; Hypothyroidism; Idiopathic peripheral neuropathy; PMR (polymyalgia rheumatica) (Christine Stone); and Syncope (05-22-08 - 05/23/08).  PSH:    Past Surgical History:  Procedure Laterality Date  . APPENDECTOMY  1945  . KYPHOPLASTY N/A 11/27/2015   Procedure: KYPHOPLASTY T12 ;  Surgeon: Hessie Knows, MD;  Location: [facility] ORS;  Service: Orthopedics;  Laterality: N/A;  . PARTIAL HYSTERECTOMY     Ovaries intact   . PLEURAL SCARIFICATION     Pleural Fluid B9    Current Outpatient  Prescriptions  Medication Sig Dispense Refill  . apixaban (ELIQUIS) 2.5 MG TABS tablet Take 2.5 mg by mouth 2 (two) times daily.    . carvedilol (COREG) 3.125 MG tablet Take 1 tablet (3.125 mg total) by mouth 2 (two) times daily with a meal. 180 tablet 3  . Cranberry 250 MG CAPS Take by mouth daily.    . diphenhydrAMINE (BENADRYL) 25 MG tablet Take 12.5 mg by  mouth every 6 (six) hours as needed.    . ENSURE PLUS (ENSURE PLUS) LIQD Take 237 mLs by mouth 3 (three) times daily.    Marland Kitchen escitalopram (LEXAPRO) 10 MG tablet Take 10 mg by mouth daily.    . Fluticasone-Salmeterol (ADVAIR) 250-50 MCG/DOSE AEPB Inhale 1 puff into the lungs 2 (two) times daily.    Marland Kitchen levothyroxine (SYNTHROID, LEVOTHROID) 88 MCG tablet Take 88 mcg by mouth daily before breakfast.    . meclizine (ANTIVERT) 25 MG tablet Take 25 mg by mouth daily.    . Melatonin 3 MG CAPS Take by mouth at bedtime.    . metFORMIN (GLUCOPHAGE) 500 MG tablet Take 500 mg by mouth 2 (two) times daily with a meal.    . omeprazole (PRILOSEC) 20 MG capsule Take 20 mg by mouth 2 (two) times daily before a meal.     No current facility-administered medications for this visit.      Allergies:   Loratadine; Other; Sulfa antibiotics; Sulfasalazine; and Tape   Social History:  The patient  reports that she quit smoking about 26 years ago. Her smoking use included Cigarettes. She has a 20.00 pack-year smoking history. She has never used smokeless tobacco. She reports that she does not drink alcohol or use drugs.   Family History:   family history includes Cancer in her brother and sister; Coronary artery disease in her father; Heart failure in her sister; Hypertension in her sister; Thrombosis (age of onset: 4) in her father.    Review of Systems: Review of Systems  Constitutional: Negative.   Respiratory: Negative.   Cardiovascular: Negative.   Gastrointestinal: Negative.   Musculoskeletal: Positive for falls.       Gait instability  Neurological: Positive for dizziness.  Psychiatric/Behavioral: Negative.   All other systems reviewed and are negative.    PHYSICAL EXAM: VS:  BP 112/60 (BP Location: Right Arm, Patient Position: Sitting, Cuff Size: Normal)   Pulse 77   Ht 5\' 4"  (1.626 m)   Wt 116 lb (52.6 kg)   BMI 19.91 kg/m  , BMI Body mass index is 19.91 kg/m. GEN: Thin in no acute distress   HEENT: normal , Hard of hearing Neck: no JVD, carotid bruits, or masses Cardiac:  irregularly irregular 2/6 SEM RSB,  No rubs, or gallops,no edema  Respiratory:  clear to auscultation bilaterally, normal work of breathing GI: soft, nontender, nondistended, + BS MS: no deformity or atrophy  Skin: warm and dry, no rash Neuro:  Strength and sensation are intact Psych: euthymic mood, full affect    Recent Labs: 03/10/2016: ALT 8; B Natriuretic Peptide 464.0; TSH 0.528 03/11/2016: BUN 30; Creatinine, Ser 1.52; Hemoglobin 9.0; Platelets 155; Potassium 4.3; Sodium 136    Lipid Panel Lab Results  Component Value Date   CHOL 232 (H) 08/30/2010   HDL 33.20 (L) 08/30/2010   LDLCALC 126 (H) 04/23/2010   TRIG 225.0 (H) 08/30/2010      Wt Readings from Last 3 Encounters:  10/14/16 116 lb (52.6 kg)  09/09/16 116 lb (52.6 kg)  03/11/16  128 lb 12.8 oz (58.4 kg)       ASSESSMENT AND PLAN:  SOB (shortness of breath) - Plan: EKG 12-Lead Not very active at baseline, walks with walker  Atrial fibrillation, unspecified type ([place]) - Plan: EKG 12-Lead Given her significant dizziness with standing consistent with orthostasis recommended,  she decrease the Coreg down to 3.125 mg twice a day. Previously 12 mg.  Also we'll hold Aldactone given renal dysfunction, concern for prerenal state   Mixed hyperlipidemia  currently not on medication   HYPERTENSION, BENIGN  low blood pressure, history of orthostasis  4 recent falls, possibly secondary to dizziness when standing  Medication changes as above  14 pound weight loss likely contribute to drop in blood pressure   Nonrheumatic aortic valve stenosis  encouraged her to drink fluids  Medication changes as above  Dizziness Etiology unclear though given the falls, symptoms when standing, consistent with orthostasis     Total encounter time more than 25 minutes  Greater than 50% was spent in counseling and coordination of care with the  patient  Disposition:   F/U  6 months   Orders Placed This Encounter  Procedures  . EKG 12-Lead     Signed, Esmond Plants, M.D., Ph.D. 10/14/2016  [place], [place]

## 2016-10-14 NOTE — Patient Instructions (Addendum)
Medication Instructions:   Please decrease the coreg down to 3.125 mg twice a day Blood pressure is running low STOP aldactone  Labwork:  No new labs needed  Testing/Procedures:  No further testing at this time  Please call [phone] to speak w/ the volunteers at [facility] about a medical alert monitor  I recommend watching educational videos on topics of interest to you at:       [email or web address]  Enter code: HEARTCARE    Follow-Up: It was a pleasure seeing you in the office today. Please call us if you have new issues that need to be addressed before your next appt.  [phone]  Your physician wants you to follow-up in: 6 months.  You will receive a reminder letter in the mail two months in advance. If you don't receive a letter, please call our office to schedule the follow-up appointment.  If you need a refill on your cardiac medications before your next appointment, please call your pharmacy.

## 2017-05-17 ENCOUNTER — Emergency Department
Admission: EM | Admit: 2017-05-17 | Discharge: 2017-05-17 | Disposition: A | Discharge status: Home / Self Care | Payer: Medicare Other | Attending: Emergency Medicine | Admitting: Emergency Medicine

## 2017-05-17 ENCOUNTER — Emergency Department: Payer: Medicare Other

## 2017-05-17 ENCOUNTER — Encounter: Payer: Self-pay | Admitting: *Deleted

## 2017-05-17 DIAGNOSIS — N184 Chronic kidney disease, stage 4 (severe): Secondary | ICD-10-CM | POA: Insufficient documentation

## 2017-05-17 DIAGNOSIS — W010XXA Fall on same level from slipping, tripping and stumbling without subsequent striking against object, initial encounter: Secondary | ICD-10-CM | POA: Insufficient documentation

## 2017-05-17 DIAGNOSIS — Z87891 Personal history of nicotine dependence: Secondary | ICD-10-CM | POA: Diagnosis not present

## 2017-05-17 DIAGNOSIS — I130 Hypertensive heart and chronic kidney disease with heart failure and stage 1 through stage 4 chronic kidney disease, or unspecified chronic kidney disease: Secondary | ICD-10-CM | POA: Insufficient documentation

## 2017-05-17 DIAGNOSIS — Z7901 Long term (current) use of anticoagulants: Secondary | ICD-10-CM | POA: Insufficient documentation

## 2017-05-17 DIAGNOSIS — Y999 Unspecified external cause status: Secondary | ICD-10-CM | POA: Insufficient documentation

## 2017-05-17 DIAGNOSIS — J449 Chronic obstructive pulmonary disease, unspecified: Secondary | ICD-10-CM | POA: Insufficient documentation

## 2017-05-17 DIAGNOSIS — M25551 Pain in right hip: Secondary | ICD-10-CM | POA: Insufficient documentation

## 2017-05-17 DIAGNOSIS — S0990XA Unspecified injury of head, initial encounter: Secondary | ICD-10-CM | POA: Diagnosis not present

## 2017-05-17 DIAGNOSIS — Z79899 Other long term (current) drug therapy: Secondary | ICD-10-CM | POA: Insufficient documentation

## 2017-05-17 DIAGNOSIS — Y92129 Unspecified place in nursing home as the place of occurrence of the external cause: Secondary | ICD-10-CM | POA: Insufficient documentation

## 2017-05-17 DIAGNOSIS — E039 Hypothyroidism, unspecified: Secondary | ICD-10-CM | POA: Diagnosis not present

## 2017-05-17 DIAGNOSIS — W19XXXA Unspecified fall, initial encounter: Secondary | ICD-10-CM

## 2017-05-17 DIAGNOSIS — E1122 Type 2 diabetes mellitus with diabetic chronic kidney disease: Secondary | ICD-10-CM | POA: Insufficient documentation

## 2017-05-17 DIAGNOSIS — Y9301 Activity, walking, marching and hiking: Secondary | ICD-10-CM | POA: Insufficient documentation

## 2017-05-17 DIAGNOSIS — H538 Other visual disturbances: Secondary | ICD-10-CM | POA: Insufficient documentation

## 2017-05-17 DIAGNOSIS — I5030 Unspecified diastolic (congestive) heart failure: Secondary | ICD-10-CM | POA: Diagnosis not present

## 2017-05-17 NOTE — ED Provider Notes (Signed)
[facility] Emergency Department Provider Note  ____________________________________________   First MD Initiated Contact with Patient 05/17/17 1522     (approximate)  I have reviewed the triage vital signs and the nursing notes.   HISTORY  Chief Complaint Fall    HPI Christine Stone is a 83 y.o. female who comes to the emergency department via EMS after mechanical trip and fall at her nursing home today. She was walking with her walker when the wheel got stuck and she fell onto her right hip. She also struck the back of her head. She has a history of atrial fibrillation for which she takes Coumadin. She reports mild to moderate throbbing in her right hip worse with movement and improved with rest. She is also reporting mild severity occipital headache. She has developed vision or blurred vision. No nausea or vomiting. No chest pain or shortness of breath. Her symptoms have been slowly improving ever since the incident. She had no antecedent palpitations. She says she did not pass out.   Past Medical History:  Diagnosis Date  . Atrial fibrillation ([place])    On coumadin   . Chest pain    R/Od A. fib   . CHF (congestive heart failure) ([facility])    Diastolic -- echo 93/23 EF 55%[id number] diastolic dysfunction mild AS   . COPD (chronic obstructive pulmonary disease) ([place])   . Depression   . Diabetes mellitus    Type II   . Fracture of multiple pubic rami [facility])    [place] 2013  . GERD (gastroesophageal reflux disease)    Sleep study   . Hyperlipidemia   . Hypertension   . Hypothyroidism   . Idiopathic peripheral neuropathy   . PMR (polymyalgia rheumatica) ([facility])   . Syncope 05-22-08 - 05/23/08   [facility] syncope R/o'd ARI    Patient Active Problem List   Diagnosis Date Noted  . DNR (do not resuscitate) 03/11/2016  . Palliative care encounter 03/11/2016  . Pyrexia   . Sepsis ([place]) 03/10/2016  . Compression fracture of body of thoracic vertebra ([place])  11/24/2015  . Orthostatic hypotension 10/06/2014  . Dizziness 06/16/2014  . Chronic cough 06/16/2014  . Living will on file at physician's office 03/12/2013  . SOB (shortness of breath) 03/04/2013  . Skin lesion 12/07/2012  . Back pain 09/24/2012  . Pubic ramus fracture ([place]) 08/06/2012  . Hematuria 07/03/2012  . Feeling weak 04/12/2012  . PMR (polymyalgia rheumatica) ([facility]) 02/11/2012  . AAA (abdominal aortic aneurysm) ([place]) 01/12/2012  . HYPERTENSION, BENIGN 02/21/2010  . Aortic valve stenosis 02/24/2009  . EMPHYSEMA 08/15/2008  . VASOVAGAL SYNCOPE 05/31/2008  . Chronic kidney disease, stage IV (severe) ([place]) 02/19/2008  . NEOPLASM UNCERTAIN BEHAVIOR PLEU THYMUS&MEDIAST 09/21/2007  . PULMONARY NODULE 09/21/2007  . ALLERGIC RHINITIS 02/25/2007  . HYPOTHYROIDISM 02/07/2007  . Hyperlipidemia 02/07/2007  . Diabetes mellitus type 2, controlled, without complications ([place]) 27/01/2375  . ATRIAL FIBRILLATION -CHRONIC 02/06/2007  . CONGESTIVE HEART FAILURE 02/06/2007  . POSTMENOPAUSAL BLEEDING 02/06/2007    Past Surgical History:  Procedure Laterality Date  . APPENDECTOMY  1945  . KYPHOPLASTY N/A 11/27/2015   Procedure: KYPHOPLASTY T12 ;  Surgeon: Hessie Knows, MD;  Location: [facility] ORS;  Service: Orthopedics;  Laterality: N/A;  . PARTIAL HYSTERECTOMY     Ovaries intact   . PLEURAL SCARIFICATION     Pleural Fluid B9    Prior to Admission medications   Medication Sig Start Date End Date Taking? Authorizing Provider  apixaban (  ELIQUIS) 2.5 MG TABS tablet Take 2.5 mg by mouth 2 (two) times daily.    [provider]  carvedilol (COREG) 3.125 MG tablet Take 1 tablet (3.125 mg total) by mouth 2 (two) times daily with a meal. 10/14/16   Gollan, Kathlene November, MD  Cranberry 250 MG CAPS Take by mouth daily.    [provider]  diphenhydrAMINE (BENADRYL) 25 MG tablet Take 12.5 mg by mouth every 6 (six) hours as needed.    [provider]  ENSURE PLUS (ENSURE PLUS) LIQD  Take 237 mLs by mouth 3 (three) times daily.    [provider]  escitalopram (LEXAPRO) 10 MG tablet Take 10 mg by mouth daily.    [provider]  Fluticasone-Salmeterol (ADVAIR) 250-50 MCG/DOSE AEPB Inhale 1 puff into the lungs 2 (two) times daily.    [provider]  levothyroxine (SYNTHROID, LEVOTHROID) 88 MCG tablet Take 88 mcg by mouth daily before breakfast.    [provider]  meclizine (ANTIVERT) 25 MG tablet Take 25 mg by mouth daily.    [provider]  Melatonin 3 MG CAPS Take by mouth at bedtime.    [provider]  metFORMIN (GLUCOPHAGE) 500 MG tablet Take 500 mg by mouth 2 (two) times daily with a meal.    [provider]  omeprazole (PRILOSEC) 20 MG capsule Take 20 mg by mouth 2 (two) times daily before a meal.    [provider]    Allergies Loratadine; Other; Sulfa antibiotics; Sulfasalazine; and Tape  Family History  Problem Relation Age of Onset  . Thrombosis Father 48       thrombosis of heart   . Coronary artery disease Father   . Cancer Sister   . Heart failure Sister   . Hypertension Sister   . Cancer Brother        kidney    Social History Social History  Substance Use Topics  . Smoking status: Former Smoker    Packs/day: 1.00    Years: 20.00    Types: Cigarettes    Quit date: 08/27/1990  . Smokeless tobacco: Never Used     Comment: 1/2 ppd x 40 years quit 1988.   Marland Kitchen Alcohol use No    Review of Systems Constitutional: No fever/chills ENT: No sore throat. Cardiovascular: Denies chest pain. Respiratory: Denies shortness of breath. Gastrointestinal: No abdominal pain.  No nausea, no vomiting.  No diarrhea.  No constipation. Musculoskeletal: Negative for back pain. Neurological: Positive for headaches   ____________________________________________   PHYSICAL EXAM:  VITAL SIGNS: ED Triage Vitals  Enc Vitals Group     BP 05/17/17 1300 (!) 152/71     Pulse Rate 05/17/17 1300  98     Resp 05/17/17 1305 18     Temp 05/17/17 1305 98.1 F (36.7 C)     Temp Source 05/17/17 1305 Oral     SpO2 05/17/17 1300 96 %     Weight 05/17/17 1306 95 lb (43.1 kg)     Height 05/17/17 1306 5\' 4"  (1.626 m)     Head Circumference --      Peak Flow --      Pain Score 05/17/17 1302 2     Pain Loc --      Pain Edu? --      Excl. in [place]? --     Constitutional: Alert and oriented 4 somewhat hard of hearing but pleasant cooperative speaks in full clear sentences no diaphoresis Head: Atraumatic. No lacerations  no hematoma Nose: No congestion/rhinnorhea. Mouth/Throat: No trismus Neck: No stridor.   Cardiovascular: Regular rate and rhythm no murmurs Respiratory: Normal respiratory effort.  No retractions. Musculoskeletal: Legs are equal in size no internal or external rotation. No discomfort with logroll of hip. No discomfort or pain with internal or external rotation of hip or full flexion or extension Neurologic:  Normal speech and language. No gross focal neurologic deficits are appreciated.  Skin:  Skin is warm, dry and intact. No rash noted.    ____________________________________________  LABS (all labs ordered are listed, but only abnormal results are displayed)  Labs Reviewed - No data to display   __________________________________________  EKG   ____________________________________________  RADIOLOGY  Head CT reviewed by me no acute disease Right hip x-ray reviewed by me no acute disease ____________________________________________   PROCEDURES  Procedure(s) performed: no  Procedures  Critical Care performed: no  Observation: no ____________________________________________   INITIAL IMPRESSION / ASSESSMENT AND PLAN / ED COURSE  Pertinent labs & imaging results that were available during my care of the patient were reviewed by me and considered in my medical decision making (see chart for details).  The patient arrives hemodynamically stable,  neurovascularly intact, after a clear mechanical trip and fall. She did not syncopized and she did not pass out. Head CT is negative and hip x-rays negative as well. I was able to get the patient on her feet and ambulate without difficulty and I do not suspect an occult fracture. I do appreciate that on Coumadin a patient could have a delayed bleed, however she is going back to her nursing home where she'll be monitored. Strict return precautions given and she is discharged home by ambulance in improved condition.      ____________________________________________   FINAL CLINICAL IMPRESSION(S) / ED DIAGNOSES  Final diagnoses:  None      NEW MEDICATIONS STARTED DURING THIS VISIT:  New Prescriptions   No medications on file     Note:  This document was prepared using Dragon voice recognition software and may include unintentional dictation errors.      Darel Hong, MD 05/17/17 [phone]

## 2017-05-17 NOTE — ED Notes (Signed)
Patient is a hospice patient per [facility].

## 2017-05-17 NOTE — ED Notes (Signed)
EMS is here for transport. 

## 2017-05-17 NOTE — ED Triage Notes (Signed)
Per EMS report, Patient was walking with her walker which slid out from her grasp. Patient hit the back of her head. No LOC noted. Patient c/o right hip pain. Patient is able to lift right leg which is equal in length to the left.

## 2017-05-17 NOTE — Discharge Instructions (Signed)
Fortunately today your head CT and x-ray of your hip were normal. Please follow-up with your primary care physician on Monday as needed and return to the emergency department for any concerns.  It was a pleasure to take care of you today, and thank you for coming to our emergency department.  If you have any questions or concerns before leaving please ask the nurse to grab me and I'm more than happy to go through your aftercare instructions again.  If you were prescribed any opioid pain medication today such as Norco, Vicodin, Percocet, morphine, hydrocodone, or oxycodone please make sure you do not drive when you are taking this medication as it can alter your ability to drive safely.  If you have any concerns once you are home that you are not improving or are in fact getting worse before you can make it to your follow-up appointment, please do not hesitate to call 911 and come back for further evaluation.  Darel Hong, MD  Results for orders placed or performed during the hospital encounter of 03/10/16  Blood Culture (routine x 2)  Result Value Ref Range   Specimen Description BLOOD LEFT WRIST    Special Requests      BOTTLES DRAWN AEROBIC AND ANAEROBIC 3CCAERO,3CCANQA   Culture NO GROWTH 5 DAYS    Report Status 03/15/2016 FINAL   Blood Culture (routine x 2)  Result Value Ref Range   Specimen Description BLOOD LEFT ASSIST CONTROL    Special Requests BOTTLES DRAWN AEROBIC AND ANAEROBIC [place]    Culture NO GROWTH 5 DAYS    Report Status 03/15/2016 FINAL   Urine culture  Result Value Ref Range   Specimen Description URINE, RANDOM    Special Requests NONE    Culture 20,000 COLONIES/mL ESCHERICHIA COLI (A)    Report Status 03/12/2016 FINAL    Organism ID, Bacteria ESCHERICHIA COLI (A)       Susceptibility   Escherichia coli - MIC*    AMPICILLIN 4 SENSITIVE Sensitive     CEFAZOLIN <=4 SENSITIVE Sensitive     CEFTRIAXONE <=1 SENSITIVE Sensitive     CIPROFLOXACIN <=0.25  SENSITIVE Sensitive     GENTAMICIN <=1 SENSITIVE Sensitive     IMIPENEM <=0.25 SENSITIVE Sensitive     NITROFURANTOIN <=16 SENSITIVE Sensitive     TRIMETH/SULFA >=320 RESISTANT Resistant     AMPICILLIN/SULBACTAM 4 SENSITIVE Sensitive     PIP/TAZO <=4 SENSITIVE Sensitive     Extended ESBL NEGATIVE Sensitive     * 20,000 COLONIES/mL ESCHERICHIA COLI  Culture, blood (x 2)  Result Value Ref Range   Specimen Description BLOOD RIGHT ANTECUBITAL    Special Requests BOTTLES DRAWN AEROBIC AND ANAEROBIC  6CC    Culture NO GROWTH 5 DAYS    Report Status 03/15/2016 FINAL   Culture, blood (x 2)  Result Value Ref Range   Specimen Description BLOOD RIGHT HAND    Special Requests BOTTLES DRAWN AEROBIC AND ANAEROBIC 5CC    Culture NO GROWTH 5 DAYS    Report Status 03/15/2016 FINAL   MRSA PCR Screening  Result Value Ref Range   MRSA by PCR POSITIVE (A) NEGATIVE  Comprehensive metabolic panel  Result Value Ref Range   Sodium 132 (L) 135 - 145 mmol/L   Potassium 4.6 3.5 - 5.1 mmol/L   Chloride 99 (L) 101 - 111 mmol/L   CO2 24 22 - 32 mmol/L   Glucose, Bld 118 (H) 65 - 99 mg/dL   BUN 29 (H) 6 - 20 mg/dL  Creatinine, Ser 1.64 (H) 0.44 - 1.00 mg/dL   Calcium 9.2 8.9 - 10.3 mg/dL   Total Protein 7.6 6.5 - 8.1 g/dL   Albumin 3.7 3.5 - 5.0 g/dL   AST 15 15 - 41 U/L   ALT 9 (L) 14 - 54 U/L   Alkaline Phosphatase 73 38 - 126 U/L   Total Bilirubin 1.0 0.3 - 1.2 mg/dL   GFR calc non Af Amer 27 (L) >60 mL/min   GFR calc Af Amer 31 (L) >60 mL/min   Anion gap 9 5 - 15  CBC WITH DIFFERENTIAL  Result Value Ref Range   WBC 17.4 (H) 3.6 - 11.0 K/uL   RBC 4.26 3.80 - 5.20 MIL/uL   Hemoglobin 10.6 (L) 12.0 - 16.0 g/dL   HCT 32.4 (L) 35.0 - 47.0 %   MCV 76.2 (L) 80.0 - 100.0 fL   MCH 24.8 (L) 26.0 - 34.0 pg   MCHC 32.6 32.0 - 36.0 g/dL   RDW 17.0 (H) 11.5 - 14.5 %   Platelets 187 150 - 440 K/uL   Neutrophils Relative % 86 %   Neutro Abs 14.8 (H) 1.4 - 6.5 K/uL   Lymphocytes Relative 6 %   Lymphs  Abs 1.1 1.0 - 3.6 K/uL   Monocytes Relative 8 %   Monocytes Absolute 1.4 (H) 0.2 - 0.9 K/uL   Eosinophils Relative 0 %   Eosinophils Absolute 0.0 0 - 0.7 K/uL   Basophils Relative 0 %   Basophils Absolute 0.0 0 - 0.1 K/uL  Lactic acid, plasma  Result Value Ref Range   Lactic Acid, Venous 1.2 0.5 - 1.9 mmol/L  Lactic acid, plasma  Result Value Ref Range   Lactic Acid, Venous 0.7 0.5 - 1.9 mmol/L  Brain natriuretic peptide  Result Value Ref Range   B Natriuretic Peptide 464.0 (H) 0.0 - 100.0 pg/mL  Troponin I  Result Value Ref Range   Troponin I 0.03 (HH) <0.03 ng/mL  Urinalysis complete, with microscopic ([facility] only)  Result Value Ref Range   Color, Urine YELLOW (A) YELLOW   APPearance CLEAR (A) CLEAR   Glucose, UA NEGATIVE NEGATIVE mg/dL   Bilirubin Urine NEGATIVE NEGATIVE   Ketones, ur NEGATIVE NEGATIVE mg/dL   Specific Gravity, Urine 1.012 1.005 - 1.030   Hgb urine dipstick 1+ (A) NEGATIVE   pH 7.0 5.0 - 8.0   Protein, ur NEGATIVE NEGATIVE mg/dL   Nitrite NEGATIVE NEGATIVE   Leukocytes, UA NEGATIVE NEGATIVE   RBC / HPF 6-30 0 - 5 RBC/hpf   WBC, UA 0-5 0 - 5 WBC/hpf   Bacteria, UA NONE SEEN NONE SEEN   Squamous Epithelial / LPF NONE SEEN NONE SEEN   Mucus PRESENT   CBC with Differential  Result Value Ref Range   WBC 17.8 (H) 3.6 - 11.0 K/uL   RBC 3.75 (L) 3.80 - 5.20 MIL/uL   Hemoglobin 9.2 (L) 12.0 - 16.0 g/dL   HCT 28.5 (L) 35.0 - 47.0 %   MCV 76.1 (L) 80.0 - 100.0 fL   MCH 24.6 (L) 26.0 - 34.0 pg   MCHC 32.4 32.0 - 36.0 g/dL   RDW 17.1 (H) 11.5 - 14.5 %   Platelets 175 150 - 440 K/uL   Neutrophils Relative % 86 %   Neutro Abs 15.3 (H) 1.4 - 6.5 K/uL   Lymphocytes Relative 5 %   Lymphs Abs 0.9 (L) 1.0 - 3.6 K/uL   Monocytes Relative 9 %   Monocytes Absolute 1.6 (H) 0.2 -  0.9 K/uL   Eosinophils Relative 0 %   Eosinophils Absolute 0.0 0 - 0.7 K/uL   Basophils Relative 0 %   Basophils Absolute 0.0 0 - 0.1 K/uL  Comprehensive metabolic panel  Result Value Ref  Range   Sodium 132 (L) 135 - 145 mmol/L   Potassium 4.1 3.5 - 5.1 mmol/L   Chloride 104 101 - 111 mmol/L   CO2 21 (L) 22 - 32 mmol/L   Glucose, Bld 152 (H) 65 - 99 mg/dL   BUN 26 (H) 6 - 20 mg/dL   Creatinine, Ser 1.49 (H) 0.44 - 1.00 mg/dL   Calcium 8.1 (L) 8.9 - 10.3 mg/dL   Total Protein 6.2 (L) 6.5 - 8.1 g/dL   Albumin 3.1 (L) 3.5 - 5.0 g/dL   AST 15 15 - 41 U/L   ALT 8 (L) 14 - 54 U/L   Alkaline Phosphatase 62 38 - 126 U/L   Total Bilirubin 0.9 0.3 - 1.2 mg/dL   GFR calc non Af Amer 30 (L) >60 mL/min   GFR calc Af Amer 35 (L) >60 mL/min   Anion gap 7 5 - 15  Lactic acid, plasma  Result Value Ref Range   Lactic Acid, Venous 0.9 0.5 - 1.9 mmol/L  Procalcitonin  Result Value Ref Range   Procalcitonin 0.18 ng/mL  Protime-INR  Result Value Ref Range   Prothrombin Time 19.6 (H) 11.4 - 15.0 seconds   INR 1.66   APTT  Result Value Ref Range   aPTT 37 (H) 24 - 36 seconds  TSH  Result Value Ref Range   TSH 0.528 0.350 - 4.500 uIU/mL  Hemoglobin A1c  Result Value Ref Range   Hgb A1c MFr Bld 6.0 4.0 - 6.0 %  Basic metabolic panel  Result Value Ref Range   Sodium 136 135 - 145 mmol/L   Potassium 4.3 3.5 - 5.1 mmol/L   Chloride 107 101 - 111 mmol/L   CO2 22 22 - 32 mmol/L   Glucose, Bld 114 (H) 65 - 99 mg/dL   BUN 30 (H) 6 - 20 mg/dL   Creatinine, Ser 1.52 (H) 0.44 - 1.00 mg/dL   Calcium 8.6 (L) 8.9 - 10.3 mg/dL   GFR calc non Af Amer 30 (L) >60 mL/min   GFR calc Af Amer 34 (L) >60 mL/min   Anion gap 7 5 - 15  CBC  Result Value Ref Range   WBC 17.5 (H) 3.6 - 11.0 K/uL   RBC 3.64 (L) 3.80 - 5.20 MIL/uL   Hemoglobin 9.0 (L) 12.0 - 16.0 g/dL   HCT 28.0 (L) 35.0 - 47.0 %   MCV 77.0 (L) 80.0 - 100.0 fL   MCH 24.8 (L) 26.0 - 34.0 pg   MCHC 32.2 32.0 - 36.0 g/dL   RDW 16.9 (H) 11.5 - 14.5 %   Platelets 155 150 - 440 K/uL  Protime-INR  Result Value Ref Range   Prothrombin Time 21.4 (H) 11.4 - 15.0 seconds   INR 1.86   Glucose, capillary  Result Value Ref Range    Glucose-Capillary 105 (H) 65 - 99 mg/dL   Comment 1 Notify RN    Ct Head Wo Contrast  Result Date: 05/17/2017 CLINICAL DATA:  Fall hitting back of head. EXAM: CT HEAD WITHOUT CONTRAST TECHNIQUE: Contiguous axial images were obtained from the base of the skull through the vertex without intravenous contrast. COMPARISON:  11/24/2015 FINDINGS: Brain: Ventricles and cisterns are within normal. There is mild age related atrophic change.  Mild chronic ischemic microvascular disease is present. Old lacune infarct over the caudate bilaterally. No mass, mass effect, shift of midline structures or acute hemorrhage. No evidence of acute infarction. Vascular: No hyperdense vessel or unexpected calcification. Skull: Normal. Negative for fracture or focal lesion. Sinuses/Orbits: No acute finding. Other: None. IMPRESSION: No acute intracranial findings. Chronic ischemic microvascular disease and age related atrophic change. Small old lacune infarcts over the basal ganglia. Electronically Signed   By: Marin Olp M.D.   On: 05/17/2017 14:12   Dg Hip Unilat W Or Wo Pelvis 2-3 Views Right  Result Date: 05/17/2017 CLINICAL DATA:  Patient poor historian at this time. Per ED RN notes: Per EMS report, Patient was walking with her walker which slid out from her grasp. Patient hit the back of her head. No LOC noted. Patient c/o right hip pain. EXAM: DG HIP (WITH OR WITHOUT PELVIS) 2-3V RIGHT COMPARISON:  CT pelvis 03/10/2016 FINDINGS: Hips are located. No evidence of femoral neck fracture on the LEFT or RIGHT. Dedicated views of the RIGHT hip. Irregularity of the LEFT inferior pubic ramus is similar to CT of 03/10/2016 and not changed. No evidence of acute pelvic fracture or sacral fracture. IMPRESSION: No evidence of acute pelvic fracture or RIGHT hip fracture. Electronically Signed   By: Suzy Bouchard M.D.   On: 05/17/2017 13:58

## 2017-06-20 ENCOUNTER — Emergency Department
Admission: EM | Admit: 2017-06-20 | Discharge: 2017-06-20 | Disposition: A | Discharge status: Home / Self Care | Payer: Medicare Other | Attending: Emergency Medicine | Admitting: Emergency Medicine

## 2017-06-20 ENCOUNTER — Emergency Department: Payer: Medicare Other

## 2017-06-20 DIAGNOSIS — Z7984 Long term (current) use of oral hypoglycemic drugs: Secondary | ICD-10-CM | POA: Diagnosis not present

## 2017-06-20 DIAGNOSIS — Y92129 Unspecified place in nursing home as the place of occurrence of the external cause: Secondary | ICD-10-CM | POA: Insufficient documentation

## 2017-06-20 DIAGNOSIS — I130 Hypertensive heart and chronic kidney disease with heart failure and stage 1 through stage 4 chronic kidney disease, or unspecified chronic kidney disease: Secondary | ICD-10-CM | POA: Diagnosis not present

## 2017-06-20 DIAGNOSIS — Y9301 Activity, walking, marching and hiking: Secondary | ICD-10-CM | POA: Diagnosis not present

## 2017-06-20 DIAGNOSIS — W19XXXA Unspecified fall, initial encounter: Secondary | ICD-10-CM | POA: Insufficient documentation

## 2017-06-20 DIAGNOSIS — N184 Chronic kidney disease, stage 4 (severe): Secondary | ICD-10-CM | POA: Insufficient documentation

## 2017-06-20 DIAGNOSIS — Z79899 Other long term (current) drug therapy: Secondary | ICD-10-CM | POA: Insufficient documentation

## 2017-06-20 DIAGNOSIS — Y999 Unspecified external cause status: Secondary | ICD-10-CM | POA: Insufficient documentation

## 2017-06-20 DIAGNOSIS — S0003XA Contusion of scalp, initial encounter: Secondary | ICD-10-CM

## 2017-06-20 DIAGNOSIS — E039 Hypothyroidism, unspecified: Secondary | ICD-10-CM | POA: Diagnosis not present

## 2017-06-20 DIAGNOSIS — J449 Chronic obstructive pulmonary disease, unspecified: Secondary | ICD-10-CM | POA: Diagnosis not present

## 2017-06-20 DIAGNOSIS — Z7901 Long term (current) use of anticoagulants: Secondary | ICD-10-CM | POA: Diagnosis not present

## 2017-06-20 DIAGNOSIS — R296 Repeated falls: Secondary | ICD-10-CM

## 2017-06-20 DIAGNOSIS — S0990XA Unspecified injury of head, initial encounter: Secondary | ICD-10-CM | POA: Diagnosis present

## 2017-06-20 DIAGNOSIS — E1122 Type 2 diabetes mellitus with diabetic chronic kidney disease: Secondary | ICD-10-CM | POA: Diagnosis not present

## 2017-06-20 DIAGNOSIS — Z87891 Personal history of nicotine dependence: Secondary | ICD-10-CM | POA: Diagnosis not present

## 2017-06-20 DIAGNOSIS — I5032 Chronic diastolic (congestive) heart failure: Secondary | ICD-10-CM | POA: Insufficient documentation

## 2017-06-20 LAB — TROPONIN I

## 2017-06-20 LAB — BASIC METABOLIC PANEL
Anion gap: 10 (ref 5–15)
BUN: 43 mg/dL — AB (ref 6–20)
CHLORIDE: 103 mmol/L (ref 101–111)
CO2: 24 mmol/L (ref 22–32)
CREATININE: 1.49 mg/dL — AB (ref 0.44–1.00)
Calcium: 9.4 mg/dL (ref 8.9–10.3)
GFR calc non Af Amer: 30 mL/min — ABNORMAL LOW (ref >60)
GFR, EST AFRICAN AMERICAN: 35 mL/min — AB (ref >60)
Glucose, Bld: 92 mg/dL (ref 65–99)
POTASSIUM: 4.8 mmol/L (ref 3.5–5.1)
Sodium: 137 mmol/L (ref 135–145)

## 2017-06-20 LAB — CBC
HEMATOCRIT: 35.1 % (ref 35.0–47.0)
Hemoglobin: 11.6 g/dL — ABNORMAL LOW (ref 12.0–16.0)
MCH: 29.3 pg (ref 26.0–34.0)
MCHC: 32.9 g/dL (ref 32.0–36.0)
MCV: 89 fL (ref 80.0–100.0)
PLATELETS: 177 10*3/uL (ref 150–440)
RBC: 3.94 MIL/uL (ref 3.80–5.20)
RDW: 15.9 % — ABNORMAL HIGH (ref 11.5–14.5)
WBC: 7 10*3/uL (ref 3.6–11.0)

## 2017-06-20 LAB — URINALYSIS, COMPLETE (UACMP) WITH MICROSCOPIC
BACTERIA UA: NONE SEEN
Bilirubin Urine: NEGATIVE
GLUCOSE, UA: NEGATIVE mg/dL
Hgb urine dipstick: NEGATIVE
KETONES UR: NEGATIVE mg/dL
LEUKOCYTES UA: NEGATIVE
Nitrite: NEGATIVE
PROTEIN: NEGATIVE mg/dL
SQUAMOUS EPITHELIAL / LPF: NONE SEEN
Specific Gravity, Urine: 1.013 (ref 1.005–1.030)
pH: 5 (ref 5.0–8.0)

## 2017-06-20 NOTE — ED Notes (Addendum)
Furniture conservator/restorer notified pt is being [place] back to facility by EMS.  Pt notified of this as well.

## 2017-06-20 NOTE — Discharge Instructions (Signed)
You are evaluated after a fall and found to have scalp hematoma/contusion and no other significant traumatic injuries are suspected.  Return to emergency room immediately for any worsening condition including confusion altered mental status, new or worsening pain, trouble breathing, fever, or any other symptoms concerning to you.

## 2017-06-20 NOTE — ED Notes (Signed)
Pt placed back in clothes she had on on arrival

## 2017-06-20 NOTE — ED Triage Notes (Signed)
Pt present today via [facility] from [facility]. Facility states pt had an unwitnessed fall, pt has a raised area and some bruising on the left side of head. Pt is alert.

## 2017-06-20 NOTE — ED Provider Notes (Signed)
[facility] Emergency Department Provider Note ____________________________________________   I have reviewed the triage vital signs and the triage nursing note.  HISTORY  Chief Complaint Weakness and Fall   Historian Level 5 Caveat History Limited by poor historian History provided by EMS/nursing home report  HPI Christine Stone is a 83 y.o. female from nursing home, history of atrial fibrillation on Eliquis, presents after unwitnessed fall.  Patient is a bit of a poor historian but she states tells me that she tripped.  She tells me that she is not having headache or neck pain or chest pain or extremity pain or abdominal pain or back pain.  Symptoms mild.  Happened prior to arrival apparently.  Nothing makes it worse or better.   Past Medical History:  Diagnosis Date  . Atrial fibrillation ([place])    On coumadin   . Chest pain    R/Od A. fib   . CHF (congestive heart failure) (HCC)    Diastolic -- echo 76/72 EF 09%[id number] diastolic dysfunction mild AS   . COPD (chronic obstructive pulmonary disease) ([place])   . Depression   . Diabetes mellitus    Type II   . Fracture of multiple pubic rami [facility])    [place] 2013  . GERD (gastroesophageal reflux disease)    Sleep study   . Hyperlipidemia   . Hypertension   . Hypothyroidism   . Idiopathic peripheral neuropathy   . PMR (polymyalgia rheumatica) (HCC)   . Syncope 05-22-08 - 05/23/08   Hosp [facility] syncope R/o'd ARI    Patient Active Problem List   Diagnosis Date Noted  . DNR (do not resuscitate) 03/11/2016  . Palliative care encounter 03/11/2016  . Pyrexia   . Sepsis ([place]) 03/10/2016  . Compression fracture of body of thoracic vertebra ([place]) 11/24/2015  . Orthostatic hypotension 10/06/2014  . Dizziness 06/16/2014  . Chronic cough 06/16/2014  . Living will on file at physician's office 03/12/2013  . SOB (shortness of breath) 03/04/2013  . Skin lesion 12/07/2012  . Back pain 09/24/2012  . Pubic ramus  fracture ([place]) 08/06/2012  . Hematuria 07/03/2012  . Feeling weak 04/12/2012  . PMR (polymyalgia rheumatica) (HCC) 02/11/2012  . AAA (abdominal aortic aneurysm) ([place]) 01/12/2012  . HYPERTENSION, BENIGN 02/21/2010  . Aortic valve stenosis 02/24/2009  . EMPHYSEMA 08/15/2008  . VASOVAGAL SYNCOPE 05/31/2008  . Chronic kidney disease, stage IV (severe) ([place]) 02/19/2008  . NEOPLASM UNCERTAIN BEHAVIOR PLEU THYMUS&MEDIAST 09/21/2007  . PULMONARY NODULE 09/21/2007  . ALLERGIC RHINITIS 02/25/2007  . HYPOTHYROIDISM 02/07/2007  . Hyperlipidemia 02/07/2007  . Diabetes mellitus type 2, controlled, without complications ([place]) 66/29/4765  . ATRIAL FIBRILLATION -CHRONIC 02/06/2007  . CONGESTIVE HEART FAILURE 02/06/2007  . POSTMENOPAUSAL BLEEDING 02/06/2007    Past Surgical History:  Procedure Laterality Date  . APPENDECTOMY  1945  . KYPHOPLASTY N/A 11/27/2015   Procedure: KYPHOPLASTY T12 ;  Surgeon: Hessie Knows, MD;  Location: [facility] ORS;  Service: Orthopedics;  Laterality: N/A;  . PARTIAL HYSTERECTOMY     Ovaries intact   . PLEURAL SCARIFICATION     Pleural Fluid B9    Prior to Admission medications   Medication Sig Start Date End Date Taking? Authorizing Provider  apixaban (ELIQUIS) 2.5 MG TABS tablet Take 2.5 mg by mouth 2 (two) times daily.    [provider]  carvedilol (COREG) 3.125 MG tablet Take 1 tablet (3.125 mg total) by mouth 2 (two) times daily with a meal. 10/14/16   Gollan, Kathlene November, MD  Cranberry 250 MG CAPS Take by mouth daily.    [provider]  diphenhydrAMINE (BENADRYL) 25 MG tablet Take 12.5 mg by mouth every 6 (six) hours as needed.    [provider]  ENSURE PLUS (ENSURE PLUS) LIQD Take 237 mLs by mouth 3 (three) times daily.    [provider]  escitalopram (LEXAPRO) 10 MG tablet Take 10 mg by mouth daily.    [provider]  Fluticasone-Salmeterol (ADVAIR) 250-50 MCG/DOSE AEPB Inhale 1 puff into the lungs 2 (two) times daily.     [provider]  levothyroxine (SYNTHROID, LEVOTHROID) 88 MCG tablet Take 88 mcg by mouth daily before breakfast.    [provider]  meclizine (ANTIVERT) 25 MG tablet Take 25 mg by mouth daily.    [provider]  Melatonin 3 MG CAPS Take by mouth at bedtime.    [provider]  metFORMIN (GLUCOPHAGE) 500 MG tablet Take 500 mg by mouth 2 (two) times daily with a meal.    [provider]  omeprazole (PRILOSEC) 20 MG capsule Take 20 mg by mouth 2 (two) times daily before a meal.    [provider]    Allergies  Allergen Reactions  . Loratadine     REACTION: makes head feel funny  . Other     Allergic to bandaid adhesive- local recation  . Sulfa Antibiotics Other (See Comments)    Unknown reaction  . Sulfasalazine   . Tape     Local reaction to adhesive.      Family History  Problem Relation Age of Onset  . Thrombosis Father 48       thrombosis of heart   . Coronary artery disease Father   . Cancer Sister   . Heart failure Sister   . Hypertension Sister   . Cancer Brother        kidney    Social History Social History  Substance Use Topics  . Smoking status: Former Smoker    Packs/day: 1.00    Years: 20.00    Types: Cigarettes    Quit date: 08/27/1990  . Smokeless tobacco: Never Used     Comment: 1/2 ppd x 40 years quit 1988.   Marland Kitchen Alcohol use No    Review of Systems  Constitutional: Negative for illness. Eyes: Negative for visual changes. ENT: Negative for sore throat. Cardiovascular: Negative for chest pain. Respiratory: Negative for shortness of breath. Gastrointestinal: Negative for abdominal pain, vomiting and diarrhea. Genitourinary: Negative for dysuria. Musculoskeletal: Negative for back pain. Skin: Negative for rash. Neurological: Negative for headache.  ____________________________________________   PHYSICAL EXAM:  VITAL SIGNS: ED Triage Vitals  Enc Vitals Group     BP      Pulse      Resp       Temp      Temp src      SpO2      Weight      Height      Head Circumference      Peak Flow      Pain Score      Pain Loc      Pain Edu?      Excl. in [place]?      Constitutional: Alert and cooperative, disoriented to time, a bit of a poor historian. Well appearing and in no distress. HEENT   Head: Normocephalic.  Right side slight hematoma, mild tenderness without any bogginess.      Eyes: Conjunctivae are normal. Pupils  equal and round.       Ears:         Nose: No congestion/rhinnorhea.   Mouth/Throat: Mucous membranes are mildly dry.   Neck: No stridor. Cardiovascular/Chest: Normal rate, irregularly irregular.  No murmurs, rubs, or gallops. Respiratory: Normal respiratory effort without tachypnea nor retractions. Breath sounds are clear and equal bilaterally. No wheezes/rales/rhonchi.  Small nontender to AP and lateral compression. Gastrointestinal: Soft. No distention, no guarding, no rebound. Nontender.    Genitourinary/rectal:Deferred Musculoskeletal: Stable hips nontender.  Nontender with normal range of motion in all extremities. No joint effusions.  No lower extremity tenderness.  No edema. Neurologic: No facial droop.  Normal speech and language. No gross or focal neurologic deficits are appreciated. Skin:  Skin is warm, dry and intact. No rash noted. Psychiatric: Mood and affect are normal. Speech and behavior are normal. Patient exhibits appropriate insight and judgment.   ____________________________________________  LABS (pertinent positives/negatives) I, Lisa Roca, MD the attending physician have reviewed the labs noted below.  Labs Reviewed  BASIC METABOLIC PANEL - Abnormal; Notable for the following:       Result Value   BUN 43 (*)    Creatinine, Ser 1.49 (*)    GFR calc non Af Amer 30 (*)    GFR calc Af Amer 35 (*)    All other components within normal limits  CBC - Abnormal; Notable for the following:    Hemoglobin 11.6 (*)    RDW 15.9  (*)    All other components within normal limits  URINALYSIS, COMPLETE (UACMP) WITH MICROSCOPIC - Abnormal; Notable for the following:    Color, Urine YELLOW (*)    APPearance CLEAR (*)    All other components within normal limits  TROPONIN I    ____________________________________________    EKG I, Lisa Roca, MD, the attending physician have personally viewed and interpreted all ECGs.  79 bpm.  Irregularly irregular consistent with atrial fibrillation.  Narrow QRS.  Likely left axis deviation.  Nonspecific ST and T wave. ____________________________________________  RADIOLOGY All Xrays were viewed by me.  Imaging interpreted by Radiologist, and I, Lisa Roca, MD the attending physician have reviewed the radiologist interpretation noted below.  CT head without contrast:  IMPRESSION: 1. Small right lateral scalp hematoma without skull fracture or intracranial hemorrhage. 2. Stable moderate diffuse cerebral and cerebellar atrophy and chronic small vessel white matter ischemic changes in both cerebral hemispheres. __________________________________________  PROCEDURES  Procedure(s) performed: None  Critical Care performed: None  ____________________________________________  No current facility-administered medications on file prior to encounter.    Current Outpatient Prescriptions on File Prior to Encounter  Medication Sig Dispense Refill  . apixaban (ELIQUIS) 2.5 MG TABS tablet Take 2.5 mg by mouth 2 (two) times daily.    . carvedilol (COREG) 3.125 MG tablet Take 1 tablet (3.125 mg total) by mouth 2 (two) times daily with a meal. 180 tablet 3  . Cranberry 250 MG CAPS Take by mouth daily.    . diphenhydrAMINE (BENADRYL) 25 MG tablet Take 12.5 mg by mouth every 6 (six) hours as needed.    . ENSURE PLUS (ENSURE PLUS) LIQD Take 237 mLs by mouth 3 (three) times daily.    Marland Kitchen escitalopram (LEXAPRO) 10 MG tablet Take 10 mg by mouth daily.    . Fluticasone-Salmeterol  (ADVAIR) 250-50 MCG/DOSE AEPB Inhale 1 puff into the lungs 2 (two) times daily.    Marland Kitchen levothyroxine (SYNTHROID, LEVOTHROID) 88 MCG tablet Take 88 mcg by mouth daily before breakfast.    .  meclizine (ANTIVERT) 25 MG tablet Take 25 mg by mouth daily.    . Melatonin 3 MG CAPS Take by mouth at bedtime.    . metFORMIN (GLUCOPHAGE) 500 MG tablet Take 500 mg by mouth 2 (two) times daily with a meal.    . omeprazole (PRILOSEC) 20 MG capsule Take 20 mg by mouth 2 (two) times daily before a meal.      ____________________________________________  ED COURSE / ASSESSMENT AND PLAN  Pertinent labs & imaging results that were available during my care of the patient were reviewed by me and considered in my medical decision making (see chart for details).   Ms. Plamondon is a bit of a poor historian but tells me that she tripped.  It was an unwitnessed fall per the report.  She is on Eliquis, and elderly, and has some evidence of head/scalp injury, CT was obtained and negative for intracranial traumatic injury.  Patient is very clear about not having pain at her neck.  No other suspected traumatic injuries.  Laboratory studies are overall reassuring, hg 11.6 up from prior.  No UTi on ua.  Bun cr are fairly similar to prior.  Will encourage po hydration with slightly elevated bun.    CONSULTATIONS:   None   Patient / Family / Caregiver informed of clinical course, medical decision-making process, and agree with plan.   I discussed return precautions, follow-up instructions, and discharge instructions with patient and/or family.  Discharge Instructions : You are evaluated after a fall and found to have scalp hematoma/contusion and no other significant traumatic injuries are suspected.  Return to emergency room immediately for any worsening condition including confusion altered mental status, new or worsening pain, trouble breathing, fever, or any other symptoms concerning to  you.  ___________________________________________   FINAL CLINICAL IMPRESSION(S) / ED DIAGNOSES   Final diagnoses:  Scalp hematoma, initial encounter  Unwitnessed fall              Note: This dictation was prepared with Dragon dictation. Any transcriptional errors that result from this process are unintentional    Lisa Roca, MD 06/20/17 1956

## 2017-06-20 NOTE — ED Notes (Signed)
Pt returned from CT, resting in bed in no distress 

## 2017-06-20 NOTE — ED Notes (Signed)
EMS here to take pt back to [facility]

## 2017-07-26 DEATH — deceased
